# Patient Record
Sex: Female | Born: 1965 | Race: White | Hispanic: No | Marital: Married | Smoking: Never smoker
Health system: Southern US, Community
[De-identification: ages and names within clinical notes are randomized; demographics above are authoritative.]

## PROBLEM LIST (undated history)

## (undated) DIAGNOSIS — T7840XA Allergy, unspecified, initial encounter: Secondary | ICD-10-CM

## (undated) DIAGNOSIS — F329 Major depressive disorder, single episode, unspecified: Secondary | ICD-10-CM

## (undated) DIAGNOSIS — G44219 Episodic tension-type headache, not intractable: Secondary | ICD-10-CM

## (undated) DIAGNOSIS — F32A Depression, unspecified: Secondary | ICD-10-CM

## (undated) DIAGNOSIS — M1990 Unspecified osteoarthritis, unspecified site: Secondary | ICD-10-CM

## (undated) DIAGNOSIS — G5601 Carpal tunnel syndrome, right upper limb: Secondary | ICD-10-CM

## (undated) HISTORY — DX: Depression, unspecified: F32.A

## (undated) HISTORY — DX: Major depressive disorder, single episode, unspecified: F32.9

## (undated) HISTORY — DX: Allergy, unspecified, initial encounter: T78.40XA

## (undated) HISTORY — DX: Episodic tension-type headache, not intractable: G44.219

---
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
## 1998-12-16 ENCOUNTER — Other Ambulatory Visit: Admission: RE | Admit: 1998-12-16 | Discharge: 1998-12-16 | Payer: Self-pay | Admitting: Obstetrics and Gynecology

## 1999-09-01 ENCOUNTER — Encounter (INDEPENDENT_AMBULATORY_CARE_PROVIDER_SITE_OTHER): Payer: Self-pay

## 1999-09-01 ENCOUNTER — Inpatient Hospital Stay (HOSPITAL_COMMUNITY): Admission: AD | Admit: 1999-09-01 | Discharge: 1999-09-03 | Payer: Self-pay | Admitting: Obstetrics and Gynecology

## 1999-09-11 ENCOUNTER — Encounter (HOSPITAL_COMMUNITY): Admission: RE | Admit: 1999-09-11 | Discharge: 1999-12-10 | Payer: Self-pay | Admitting: Obstetrics and Gynecology

## 1999-10-02 ENCOUNTER — Other Ambulatory Visit: Admission: RE | Admit: 1999-10-02 | Discharge: 1999-10-02 | Payer: Self-pay | Admitting: Obstetrics and Gynecology

## 1999-12-12 ENCOUNTER — Encounter: Admission: RE | Admit: 1999-12-12 | Discharge: 2000-02-05 | Payer: Self-pay | Admitting: Obstetrics and Gynecology

## 2001-11-20 ENCOUNTER — Other Ambulatory Visit: Admission: RE | Admit: 2001-11-20 | Discharge: 2001-11-20 | Payer: Self-pay | Admitting: Obstetrics and Gynecology

## 2002-12-04 ENCOUNTER — Other Ambulatory Visit: Admission: RE | Admit: 2002-12-04 | Discharge: 2002-12-04 | Payer: Self-pay | Admitting: Obstetrics and Gynecology

## 2003-11-28 ENCOUNTER — Other Ambulatory Visit: Admission: RE | Admit: 2003-11-28 | Discharge: 2003-11-28 | Payer: Self-pay | Admitting: Obstetrics and Gynecology

## 2004-12-31 ENCOUNTER — Other Ambulatory Visit: Admission: RE | Admit: 2004-12-31 | Discharge: 2004-12-31 | Payer: Self-pay | Admitting: Obstetrics and Gynecology

## 2006-02-07 ENCOUNTER — Other Ambulatory Visit: Admission: RE | Admit: 2006-02-07 | Discharge: 2006-02-07 | Payer: Self-pay | Admitting: Obstetrics and Gynecology

## 2008-04-04 ENCOUNTER — Encounter: Admission: RE | Admit: 2008-04-04 | Discharge: 2008-04-04 | Payer: Self-pay | Admitting: Obstetrics and Gynecology

## 2011-02-05 ENCOUNTER — Other Ambulatory Visit: Payer: Self-pay | Admitting: Family Medicine

## 2011-02-05 DIAGNOSIS — Z1231 Encounter for screening mammogram for malignant neoplasm of breast: Secondary | ICD-10-CM

## 2011-02-12 ENCOUNTER — Ambulatory Visit: Payer: Self-pay

## 2011-03-05 ENCOUNTER — Ambulatory Visit
Admission: RE | Admit: 2011-03-05 | Discharge: 2011-03-05 | Disposition: A | Discharge status: Home / Self Care | Payer: 59 | Source: Ambulatory Visit | Attending: Family Medicine | Admitting: Family Medicine

## 2011-03-05 DIAGNOSIS — Z1231 Encounter for screening mammogram for malignant neoplasm of breast: Secondary | ICD-10-CM

## 2011-03-05 NOTE — Op Note (Signed)
[facility]  Patient:    Jillian Mcknight                         MRN: [id number] Proc. Date: 09/01/99 Adm. Date:  [id number] Attending:  Madelyn Flavors                           Operative Report  PREOPERATIVE DIAGNOSIS:  POSTOPERATIVE DIAGNOSIS:  OPERATION:                    Delivery note.  SURGEON:                      Beather Arbour. Thomasena Edis, M.D.  ASSISTANT:  ANESTHESIA:  ESTIMATED BLOOD LOSS:  DESCRIPTION OF PROCEDURE:     The patient progressed rapidly in labor and pushed effectively.  She delivered via spontaneous vaginal delivery a viable female, Apgars 8 and 9, cord pH 7.28 over a small second degree median episiotomy.  Episiotomy was performed because it was apparent that the patient would sustain a significant tear.  The patient did agree to this procedure.  As the fetus delivered, the hands were up by the shoulders and beyond as if the baby had been lying on its hands.  Great care was taken in this delivery to decrease the risk of fourth degree laceration.  The shoulders were delivered without difficulty.  Fortunately, the  patient did not sustain any laceration.  With delivery of the baby, there was noted to be a very foul odor, although, amniotic fluid which was noted to be nonpurulent. The patient did rupture membranes a couple of hours prior to delivery.  The placenta was delivered spontaneous and intact with a three vessel cord and again was sent to pathology due to the very foul-smelling nature of the placenta. Repair of the second degree median episiotomy was done with 3-0 Dexon in the usual fashion using 1% lidocaine as the anesthesia.  There was an intact rectovaginal septum ith no sponges in the vagina.  Estimated blood loss was 400 cc.  The patient tolerated the procedure well.  The baby was found to be a viable female with Apgars of 8 and 9, cord pH 7.28, and went to the normal newborn nursery.  The decision was  made by the physician to treat the patient with Unasyn IV for 24 hours due to the foul-smelling placenta.  In addition, preeclampsia labs returned because the patient had been  noted to have elevated blood pressures.  DTRs were somewhat brisk at 2+. Because the patient was noted to have LDH of 398, SGOT of 71, and hemoconcentrated at 14.4, the decision was made if continued with elevated blood pressures above the patients baseline, the decision was made to place the patient on magnesium sulfate for 24 hours.  We will recheck preeclampsia labs in the morning.  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  She works at [employer], had checked er own Group B Strep culture and noted that it was negative which she relayed to me at admission.  The husband is also a Research scientist (medical) and works at [employer] and e and the patient sent another Group B Strep culture to [facility] which was also negative.  To corroborate this, I called our head nurse, Julio Sicks at  our office, had her pull up the hard copy from [employer], and found that the Group B  Strep culture was negative.  Thus the patient was not treated with antibiotics because her Group B Strep culture was negative.  Of note, after I had totally and completely finished the delivery and we had this very long discussion about Group B Strep when the patient initially came in in labor and the patient and her husband adamantly told me that this was negative and we had a negative hard copy at our  office from [employer], the patient states that many years ago, she did test positive for Group B Strep.  Thus, it is this physicians desire to enter this in the record to be considered for possible treatment of Group B Strep with the next pregnancy. This has been relayed to the patient.  However, this physician had not been made aware of any  of this information and this is also not in the patients chart here at the hospital.  The patient tolerated the delivery well and was sent to AICU n magnesium sulfate. DD:  09/01/99 TD:  09/02/99 Job: [id number]

## 2012-05-15 ENCOUNTER — Ambulatory Visit (INDEPENDENT_AMBULATORY_CARE_PROVIDER_SITE_OTHER): Payer: 59 | Admitting: Family Medicine

## 2012-05-15 ENCOUNTER — Encounter: Payer: Self-pay | Admitting: Family Medicine

## 2012-05-15 ENCOUNTER — Other Ambulatory Visit (HOSPITAL_COMMUNITY)
Admission: RE | Admit: 2012-05-15 | Discharge: 2012-05-15 | Disposition: A | Discharge status: Home / Self Care | Payer: 59 | Source: Ambulatory Visit | Attending: Family Medicine | Admitting: Family Medicine

## 2012-05-15 VITALS — BP 123/78 | HR 90 | Temp 98.4°F | Ht 66.5 in | Wt 168.2 lb

## 2012-05-15 DIAGNOSIS — Z124 Encounter for screening for malignant neoplasm of cervix: Secondary | ICD-10-CM

## 2012-05-15 DIAGNOSIS — Z01419 Encounter for gynecological examination (general) (routine) without abnormal findings: Secondary | ICD-10-CM | POA: Insufficient documentation

## 2012-05-15 LAB — BASIC METABOLIC PANEL
CO2: 27 mEq/L (ref 19–32)
Calcium: 9.6 mg/dL (ref 8.4–10.5)
Creatinine, Ser: 0.8 mg/dL (ref 0.4–1.2)

## 2012-05-15 LAB — CBC WITH DIFFERENTIAL/PLATELET
Basophils Absolute: 0 10*3/uL (ref 0.0–0.1)
Eosinophils Absolute: 0.2 10*3/uL (ref 0.0–0.7)
HCT: 39.5 % (ref 36.0–46.0)
Hemoglobin: 13.2 g/dL (ref 12.0–15.0)
Lymphs Abs: 1.3 10*3/uL (ref 0.7–4.0)
MCHC: 33.4 g/dL (ref 30.0–36.0)
Monocytes Relative: 9 % (ref 3.0–12.0)
Neutro Abs: 3.9 10*3/uL (ref 1.4–7.7)
RDW: 13.7 % (ref 11.5–14.6)

## 2012-05-15 LAB — LIPID PANEL
Cholesterol: 234 mg/dL — ABNORMAL HIGH (ref 0–200)
Total CHOL/HDL Ratio: 2
Triglycerides: 101 mg/dL (ref 0.0–149.0)

## 2012-05-15 LAB — TSH: TSH: 2.73 u[IU]/mL (ref 0.35–5.50)

## 2012-05-15 LAB — HEPATIC FUNCTION PANEL
Bilirubin, Direct: 0 mg/dL (ref 0.0–0.3)
Total Protein: 7.3 g/dL (ref 6.0–8.3)

## 2012-05-15 LAB — LDL CHOLESTEROL, DIRECT: Direct LDL: 125 mg/dL

## 2012-05-15 MED ORDER — NORGESTIMATE-ETH ESTRADIOL 0.25-35 MG-MCG PO TABS: 1.0000 | ORAL_TABLET | Freq: Every day | ORAL | Status: DC

## 2012-05-15 MED ORDER — FLUTICASONE PROPIONATE 50 MCG/ACT NA SUSP: 2.0000 | Freq: Every day | NASAL | Status: DC

## 2012-05-15 MED ORDER — FLUOXETINE HCL 10 MG PO CAPS: 10.0000 mg | ORAL_CAPSULE | Freq: Every day | ORAL | Status: DC

## 2012-05-15 NOTE — Assessment & Plan Note (Signed)
Pt's PE WNL.  UTD on mammo.  Too young for colonoscopy/DEXA.  Check labs.  EKG done- see document for interpretation.  Anticipatory guidance provided.

## 2012-05-15 NOTE — Progress Notes (Signed)
  Subjective:    Patient ID: Jillian Mcknight, female    DOB: 05-20-1966, 46 y.o.   MRN: [id number]  HPI New to establish.  No previous PCP.  GYN- [place].  Desires CPE today.   Review of Systems Patient reports no vision/ hearing changes, adenopathy,fever, weight change,  persistant/recurrent hoarseness , swallowing issues, chest pain, palpitations, edema, persistant/recurrent cough, hemoptysis, dyspnea (rest/exertional/paroxysmal nocturnal), gastrointestinal bleeding (melena, rectal bleeding), abdominal pain, significant heartburn, bowel changes, GU symptoms (dysuria, hematuria, incontinence), Gyn symptoms (abnormal  bleeding, pain),  syncope, focal weakness, memory loss, numbness & tingling, skin/hair/nail changes, abnormal bruising or bleeding, anxiety, or depression.     Objective:   Physical Exam  General Appearance:    Alert, cooperative, no distress, appears stated age  Head:    Normocephalic, without obvious abnormality, atraumatic  Eyes:    PERRL, conjunctiva/corneas clear, EOM's intact, fundi    benign, both eyes  Ears:    Normal TM's and external ear canals, both ears  Nose:   Nares normal, septum midline, mucosa normal, no drainage    or sinus tenderness  Throat:   Lips, mucosa, and tongue normal; teeth and gums normal  Neck:   Supple, symmetrical, trachea midline, no adenopathy;    Thyroid: no enlargement/tenderness/nodules  Back:     Symmetric, no curvature, ROM normal, no CVA tenderness  Lungs:     Clear to auscultation bilaterally, respirations unlabored  Chest Wall:    No tenderness or deformity   Heart:    Regular rate and rhythm, S1 and S2 normal, no murmur, rub   or gallop  Breast Exam:    No tenderness, masses, or nipple abnormality  Abdomen:     Soft, non-tender, bowel sounds active all four quadrants,    no masses, no organomegaly  Genitalia:    External genitalia normal, cervix normal in appearance, no CMT, uterus in normal size and position, adnexa w/out mass or  tenderness, mucosa pink and moist, no lesions or discharge present  Rectal:    Normal external appearance  Extremities:   Extremities normal, atraumatic, no cyanosis or edema  Pulses:   2+ and symmetric all extremities  Skin:   Skin color, texture, turgor normal, no rashes or lesions  Lymph nodes:   Cervical, supraclavicular, and axillary nodes normal  Neurologic:   CNII-XII intact, normal strength, sensation and reflexes    throughout          Assessment & Plan:

## 2012-05-15 NOTE — Patient Instructions (Addendum)
Follow up in 1 year- sooner if needed We'll notify you of your lab results and make any changes if needed You look great!  Keep up the good work! Schedule exercise the way you would schedule an appt Call with any questions or concerns Welcome!  We're glad to have you!!!

## 2012-05-15 NOTE — Assessment & Plan Note (Signed)
Pap collected. 

## 2012-05-19 ENCOUNTER — Encounter: Payer: Self-pay | Admitting: *Deleted

## 2012-09-19 ENCOUNTER — Other Ambulatory Visit: Payer: Self-pay

## 2012-09-19 MED ORDER — FLUOXETINE HCL 10 MG PO CAPS: 10.0000 mg | ORAL_CAPSULE | Freq: Every day | ORAL | Status: DC

## 2012-09-19 NOTE — Telephone Encounter (Signed)
Rf request [facility]ac [facility]      KP

## 2012-11-24 ENCOUNTER — Telehealth: Payer: Self-pay | Admitting: Family Medicine

## 2012-11-24 MED ORDER — NORGESTIMATE-ETH ESTRADIOL 0.25-35 MG-MCG PO TABS: 1.0000 | ORAL_TABLET | Freq: Every day | ORAL | Status: DC

## 2012-11-24 NOTE — Telephone Encounter (Signed)
Rx sent to the pharmacy by e-script.//AB/CMA 

## 2012-11-24 NOTE — Telephone Encounter (Signed)
Refill: Sprintec 28 day tablet. Take 1 tablet by mouth daily. Qty 84. Last fill 10-28-12

## 2012-11-27 ENCOUNTER — Telehealth: Payer: Self-pay | Admitting: *Deleted

## 2012-11-27 NOTE — Telephone Encounter (Signed)
Verbal order for Sprintec given by phone to Hardie Lora with [facility].

## 2013-01-29 ENCOUNTER — Other Ambulatory Visit: Payer: Self-pay

## 2013-01-29 DIAGNOSIS — Z1231 Encounter for screening mammogram for malignant neoplasm of breast: Secondary | ICD-10-CM

## 2013-03-09 ENCOUNTER — Ambulatory Visit
Admission: RE | Admit: 2013-03-09 | Discharge: 2013-03-09 | Disposition: A | Discharge status: Home / Self Care | Payer: BC Managed Care – PPO | Source: Ambulatory Visit

## 2013-03-09 DIAGNOSIS — Z1231 Encounter for screening mammogram for malignant neoplasm of breast: Secondary | ICD-10-CM

## 2013-04-06 ENCOUNTER — Encounter: Payer: Self-pay | Admitting: Family Medicine

## 2013-04-06 ENCOUNTER — Other Ambulatory Visit (HOSPITAL_COMMUNITY)
Admission: RE | Admit: 2013-04-06 | Discharge: 2013-04-06 | Disposition: A | Discharge status: Home / Self Care | Payer: BC Managed Care – PPO | Source: Ambulatory Visit | Attending: Family Medicine | Admitting: Family Medicine

## 2013-04-06 ENCOUNTER — Ambulatory Visit (INDEPENDENT_AMBULATORY_CARE_PROVIDER_SITE_OTHER): Payer: BC Managed Care – PPO | Admitting: Family Medicine

## 2013-04-06 ENCOUNTER — Telehealth: Payer: Self-pay | Admitting: *Deleted

## 2013-04-06 VITALS — BP 118/82 | HR 87 | Temp 98.5°F | Ht 66.0 in | Wt 169.6 lb

## 2013-04-06 DIAGNOSIS — Z01419 Encounter for gynecological examination (general) (routine) without abnormal findings: Secondary | ICD-10-CM

## 2013-04-06 DIAGNOSIS — M25562 Pain in left knee: Secondary | ICD-10-CM

## 2013-04-06 DIAGNOSIS — K589 Irritable bowel syndrome without diarrhea: Secondary | ICD-10-CM

## 2013-04-06 DIAGNOSIS — M25569 Pain in unspecified knee: Secondary | ICD-10-CM | POA: Insufficient documentation

## 2013-04-06 DIAGNOSIS — R0609 Other forms of dyspnea: Secondary | ICD-10-CM

## 2013-04-06 DIAGNOSIS — Z124 Encounter for screening for malignant neoplasm of cervix: Secondary | ICD-10-CM

## 2013-04-06 DIAGNOSIS — R0683 Snoring: Secondary | ICD-10-CM

## 2013-04-06 LAB — BASIC METABOLIC PANEL
CO2: 27 mEq/L (ref 19–32)
Calcium: 9.8 mg/dL (ref 8.4–10.5)
Chloride: 102 mEq/L (ref 96–112)
Creatinine, Ser: 0.8 mg/dL (ref 0.4–1.2)
Glucose, Bld: 92 mg/dL (ref 70–99)

## 2013-04-06 LAB — HEPATIC FUNCTION PANEL
ALT: 10 U/L (ref 0–35)
Albumin: 4 g/dL (ref 3.5–5.2)
Total Protein: 7.7 g/dL (ref 6.0–8.3)

## 2013-04-06 LAB — LDL CHOLESTEROL, DIRECT: Direct LDL: 124.7 mg/dL

## 2013-04-06 LAB — LIPID PANEL
Cholesterol: 256 mg/dL — ABNORMAL HIGH (ref 0–200)
Triglycerides: 92 mg/dL (ref 0.0–149.0)

## 2013-04-06 MED ORDER — FLUTICASONE PROPIONATE 50 MCG/ACT NA SUSP: 2.0000 | Freq: Every day | NASAL | Status: DC

## 2013-04-06 MED ORDER — FLUOXETINE HCL 10 MG PO CAPS: 10.0000 mg | ORAL_CAPSULE | Freq: Every day | ORAL | Status: DC

## 2013-04-06 MED ORDER — MONTELUKAST SODIUM 10 MG PO TABS: 10.0000 mg | ORAL_TABLET | Freq: Every day | ORAL | Status: DC

## 2013-04-06 MED ORDER — MELOXICAM 15 MG PO TABS: 15.0000 mg | ORAL_TABLET | Freq: Every day | ORAL | Status: DC

## 2013-04-06 NOTE — Telephone Encounter (Signed)
Ok for Singulair- #30, 6 refills

## 2013-04-06 NOTE — Assessment & Plan Note (Signed)
New to provider.  Pt had sleep study done w/ previous MD.  Will attempt to get records and review.  Pt does have deviated septum and this could be responsible for the snoring.  Encouraged use of Flonase regularly and trying Breathe-Right nose strips.  Will follow.

## 2013-04-06 NOTE — Patient Instructions (Addendum)
We'll notify you of your lab results and make any changes if needed Wear the knee brace w/ walking or exercise Strengthen your inner thigh w/ the exercises shown Mobic once daily for pain/inflammation ICE the knee as needed If no improvement- please call We'll review your sleep study Immodium as needed Continue the high fiber, lots of water- add Beano as needed Call with any questions or concerns Hang in there!!

## 2013-04-06 NOTE — Assessment & Plan Note (Signed)
Pt's PE WNL.  UTD on health maintenance.  Check labs.  Anticipatory guidance provided.  

## 2013-04-06 NOTE — Assessment & Plan Note (Signed)
New.  Reviewed lifestyle modifications and that it is ok to use immodium prn.  Reviewed supportive care and red flags that should prompt return.  Pt expressed understanding and is in agreement w/ plan.

## 2013-04-06 NOTE — Progress Notes (Signed)
  Subjective:    Patient ID: Jillian Mcknight, female    DOB: 10-08-1966, 47 y.o.   MRN: [id number]  HPI CPE- pt desires pap even though she had one last year.  UTD on mammo  Knee pain- L knee, started 'a couple of weeks ago'.  'it's like i can't trust it, like it's going to give out'.  Started wearing an old knee brace at [facility] last week w/ some relief.  Taking ibuprofen w/ some relief.  Will have popping laterally.  Sits w/ leg folded up underneath.  Some improvement in pain as activity level returned to normal but still feels weak, stiff, and painful.  Feels better after 'popping'.  ? IBS- 'i was constipated for the 1st 35 yrs of my life'.  Now having intermittent diarrhea, + gas.  Denies abd cramping.  No longer constipated since changing to a better, higher fiber diet.  'i snore like a man'- had sleep study done 2011 at [facility].  Wakes feeling poorly rested.  Not using Flonase.   Review of Systems Patient reports no vision/ hearing changes, adenopathy,fever, weight change,  persistant/recurrent hoarseness , swallowing issues, chest pain, palpitations, edema, persistant/recurrent cough, hemoptysis, dyspnea (rest/exertional/paroxysmal nocturnal), gastrointestinal bleeding (melena, rectal bleeding), abdominal pain, significant heartburn, GU symptoms (dysuria, hematuria, incontinence), Gyn symptoms (abnormal  bleeding, pain),  syncope, focal weakness, memory loss, numbness & tingling, skin/hair/nail changes, abnormal bruising or bleeding, anxiety, or depression.     Objective:   Physical Exam  General Appearance:    Alert, cooperative, no distress, appears stated age  Head:    Normocephalic, without obvious abnormality, atraumatic  Eyes:    PERRL, conjunctiva/corneas clear, EOM's intact, fundi    benign, both eyes  Ears:    Normal TM's and external ear canals, both ears  Nose:   Nares normal, septum midline, mucosa normal, no drainage    or sinus tenderness  Throat:   Lips, mucosa,  and tongue normal; teeth and gums normal  Neck:   Supple, symmetrical, trachea midline, no adenopathy;    Thyroid: no enlargement/tenderness/nodules  Back:     Symmetric, no curvature, ROM normal, no CVA tenderness  Lungs:     Clear to auscultation bilaterally, respirations unlabored  Chest Wall:    No tenderness or deformity   Heart:    Regular rate and rhythm, S1 and S2 normal, no murmur, rub   or gallop  Breast Exam:    No tenderness, masses, or nipple abnormality  Abdomen:     Soft, non-tender, bowel sounds active all four quadrants,    no masses, no organomegaly  Genitalia:    External genitalia normal, cervix normal in appearance, no CMT, uterus in normal size and position, adnexa w/out mass or tenderness, mucosa pink and moist, no lesions or discharge present  Rectal:    Normal external appearance  Extremities:   Extremities normal, atraumatic, no cyanosis or edema.  No TTP along L knee joint lines, no pain w/ full flexion/extension.  Good mobility of knee cap  Pulses:   2+ and symmetric all extremities  Skin:   Skin color, texture, turgor normal, no rashes or lesions  Lymph nodes:   Cervical, supraclavicular, and axillary nodes normal  Neurologic:   CNII-XII intact, normal strength, sensation and reflexes    throughout          Assessment & Plan:

## 2013-04-06 NOTE — Assessment & Plan Note (Signed)
Pap collected. 

## 2013-04-06 NOTE — Telephone Encounter (Signed)
Pt left VM that she would like to get Rx for singulair to help with her allergy. Pt notes that she is currently doing the allergra.[facility] advise [facility] [address]

## 2013-04-06 NOTE — Assessment & Plan Note (Addendum)
New.  L knee. Suspect patellofemoral syndrome but meniscal injury is also possibility.  Encouraged knee brace for support, strengthening inner thigh, icing prn.  NSAIDs prn.  If no improvement, will refer to sports med.  Pt expressed understanding and is in agreement w/ plan.

## 2013-04-06 NOTE — Telephone Encounter (Signed)
Rx sent to the pharmacy by e-script.//AB/CMA 

## 2013-04-07 LAB — CBC WITH DIFFERENTIAL/PLATELET
Basophils Relative: 0.2 % (ref 0.0–3.0)
Eosinophils Relative: 4.2 % (ref 0.0–5.0)
HCT: 41.8 % (ref 36.0–46.0)
Hemoglobin: 14 g/dL (ref 12.0–15.0)
Lymphs Abs: 1.1 10*3/uL (ref 0.7–4.0)
Monocytes Relative: 5.7 % (ref 3.0–12.0)
Neutro Abs: 3.7 10*3/uL (ref 1.4–7.7)
RBC: 4.43 Mil/uL (ref 3.87–5.11)
WBC: 5.4 10*3/uL (ref 4.5–10.5)

## 2013-04-09 ENCOUNTER — Encounter: Payer: Self-pay | Admitting: *Deleted

## 2013-04-11 LAB — VITAMIN D 1,25 DIHYDROXY
Vitamin D 1, 25 (OH)2 Total: 87 pg/mL — ABNORMAL HIGH (ref 18–72)
Vitamin D2 1, 25 (OH)2: <8 pg/mL
Vitamin D3 1, 25 (OH)2: 87 pg/mL

## 2013-04-27 ENCOUNTER — Encounter: Payer: Self-pay | Admitting: *Deleted

## 2013-05-17 ENCOUNTER — Other Ambulatory Visit: Payer: Self-pay | Admitting: Family Medicine

## 2013-06-29 ENCOUNTER — Other Ambulatory Visit: Payer: Self-pay | Admitting: Family Medicine

## 2013-06-29 DIAGNOSIS — M25562 Pain in left knee: Secondary | ICD-10-CM

## 2013-07-04 ENCOUNTER — Ambulatory Visit: Payer: BC Managed Care – PPO | Admitting: Family Medicine

## 2013-07-05 ENCOUNTER — Other Ambulatory Visit: Payer: Self-pay | Admitting: Family Medicine

## 2013-07-06 NOTE — Telephone Encounter (Signed)
[facility] SW, CMA

## 2013-07-10 ENCOUNTER — Telehealth: Payer: Self-pay | Admitting: Family Medicine

## 2013-07-10 NOTE — Telephone Encounter (Signed)
Request sent from [facility]: Fluoxetine tab Montelukast tab Sprintec

## 2013-07-17 NOTE — Telephone Encounter (Signed)
Pt needs to contact pharmacy. Already notified refills should be available.

## 2013-07-20 ENCOUNTER — Ambulatory Visit: Payer: BC Managed Care – PPO | Admitting: Family Medicine

## 2013-07-31 ENCOUNTER — Other Ambulatory Visit: Payer: Self-pay | Admitting: General Practice

## 2013-07-31 MED ORDER — FLUOXETINE HCL 10 MG PO CAPS: 10.0000 mg | ORAL_CAPSULE | Freq: Every day | ORAL | Status: DC

## 2013-07-31 MED ORDER — NORGESTIMATE-ETH ESTRADIOL 0.25-35 MG-MCG PO TABS: 1.0000 | ORAL_TABLET | Freq: Every day | ORAL | Status: DC

## 2013-09-28 ENCOUNTER — Telehealth: Payer: Self-pay | Admitting: *Deleted

## 2013-09-28 NOTE — Telephone Encounter (Signed)
Referral canceled °

## 2013-09-28 NOTE — Telephone Encounter (Signed)
Patient called the office to make up aware that she does not want a referral for her knee pain. Patient has cancelled 3 separate appts with sports med, please cancel orders.

## 2013-10-26 ENCOUNTER — Ambulatory Visit: Payer: BC Managed Care – PPO | Admitting: Family Medicine

## 2013-12-10 ENCOUNTER — Ambulatory Visit: Payer: BC Managed Care – PPO | Admitting: Family Medicine

## 2013-12-19 ENCOUNTER — Encounter: Payer: Self-pay | Admitting: Family Medicine

## 2013-12-19 ENCOUNTER — Other Ambulatory Visit (INDEPENDENT_AMBULATORY_CARE_PROVIDER_SITE_OTHER): Payer: 59

## 2013-12-19 ENCOUNTER — Ambulatory Visit (INDEPENDENT_AMBULATORY_CARE_PROVIDER_SITE_OTHER): Payer: 59 | Admitting: Family Medicine

## 2013-12-19 VITALS — BP 118/66 | HR 74 | Temp 98.9°F | Resp 16 | Wt 170.1 lb

## 2013-12-19 DIAGNOSIS — M357 Hypermobility syndrome: Secondary | ICD-10-CM | POA: Insufficient documentation

## 2013-12-19 DIAGNOSIS — M7710 Lateral epicondylitis, unspecified elbow: Secondary | ICD-10-CM

## 2013-12-19 DIAGNOSIS — M25569 Pain in unspecified knee: Secondary | ICD-10-CM

## 2013-12-19 NOTE — Assessment & Plan Note (Signed)
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 I do not think this is Marfan syndrome. Patient given home exercises and discussed strengthening muscles and avoiding any increasing flexibility. Patient given a knee compression sleeve today and can get other compression sleeves for other joints if we have problems. Patient and will followup again in 3-4 weeks. Do not feel further workup is necessary at this time.

## 2013-12-19 NOTE — Progress Notes (Signed)
[name] [address] [place],  [address] Phone: [phone] Subjective:    I'm seeing this patient by the request  of:  Annye Asa, MD   CC: Left knee discomfort  IFO:YDXAJOINOM Jillian Mcknight is a 48 y.o. female coming in with complaint of left knee discomfort. Patient is a 48 year old female who is never injured this knee but states that she has had increased flexibility of multiple joints all her life. Patient is to she say pop out her knee from time to time as a party trick. Patient states though recently while she was trying to increase the amount walking she was doing she started having some discomfort mostly over the lateral knee pain patient states that he can have some popping with some discomfort. Patient does not remember what type of activity seems to make her worse other than walking. Patient states it seems to come and go and does respond well to anti-inflammatories. Patient states from time to time she feels that there is swelling in the knee as well. Denies giving out on her but states that it feels unstable. Patient rates the discomfort is more of a dull pain that is 3/10 but the instability is what she is concerned about.     Past medical history, social, surgical and family history all reviewed in electronic medical record.   Review of Systems: No headache, visual changes, nausea, vomiting, diarrhea, constipation, dizziness, abdominal pain, skin rash, fevers, chills, night sweats, weight loss, swollen lymph nodes, body aches, joint swelling, muscle aches, chest pain, shortness of breath, mood changes.   Objective Blood pressure 118/66, pulse 74, temperature 98.9 F (37.2 C), temperature source Oral, resp. rate 16, weight 170 lb 1.3 oz (77.148 kg), SpO2 99.00%.  General: No apparent distress alert and oriented x3 mood and affect normal, dressed appropriately.  HEENT: Pupils equal, extraocular movements intact  Respiratory:  Patient's speak in full sentences and does not appear short of breath  Cardiovascular: No lower extremity edema, non tender, no erythema  Skin: Warm dry intact with no signs of infection or rash on extremities or on axial skeleton.  Abdomen: Soft nontender  Neuro: Cranial nerves II through XII are intact, neurovascularly intact in all extremities with 2+ DTRs and 2+ pulses.  Lymph: No lymphadenopathy of posterior or anterior cervical chain or axillae bilaterally.  Gait normal with good balance and coordination.  MSK:  Non tender with full range of motion and good stability and symmetric strength and tone of shoulders, elbows, wrist, hip, and ankles bilaterally. Patient does have significant hypermobility of multiple joints. Beighton score of 8 Knee: Left Normal to inspection with no erythema or effusion or obvious bony abnormalities. Palpation normal with no warmth, joint line tenderness, patellar tenderness, or condyle tenderness. ROM is full with 10 of hyperextension.. Ligaments with solid consistent endpoints including ACL, PCL, LCL, MCL. Negative Mcmurray's, Apley's, and Thessalonian tests. Non painful patellar compression. Patellar glide without crepitus. Patellar and quadriceps tendons unremarkable. Hamstring and quadriceps strength is normal.  Contralateral knee he has no pain is still has the 10 of hyperextension   MSK US performed of: Left knee This study was ordered, performed, and interpreted by Jillian Mcknight D.O.  Knee: All structures visualized. Anteromedial, anterolateral, posteromedial, and posterolateral menisci unremarkable without tearing, fraying, effusion, or displacement. Patient does have some mild swelling around the popliteus muscle. Patellar Tendon unremarkable on long and transverse views without effusion. No abnormality of prepatellar bursa. LCL and MCL unremarkable on  long and transverse views. No abnormality of origin of medial or lateral head of the  gastrocnemius.  IMPRESSION:  Questionable popliteus subluxation   Elbow: Right Unremarkable to inspection. Range of motion full pronation, supination, flexion, extension. Strength is full to all of the above directions Stable to varus, valgus stress. Negative moving valgus stress test. Tender to palpation over the lateral epicondylar region with positive pain at the lateral epicondylar region with resisted extension of the middle finger. Ulnar nerve does not sublux. Negative cubital tunnel Tinel's. Contralateral total unremarkable   Impression and Recommendations:     This case required medical decision making of moderate complexity.

## 2013-12-19 NOTE — Assessment & Plan Note (Signed)
Try conservative therapy at this time. Given home exercises, discussed icing regimen, patient declined a wrist brace at this time but this is well at start of followup is still having pain. Patient may be a candidate for nitroglycerin as well if does not improve. We'll do a week course of anti-inflammatory. We'll see patient back 3 to 4 weeks.

## 2013-12-19 NOTE — Assessment & Plan Note (Signed)
Patient's left lateral knee pain is likely multifactorial in is secondary to her hypermobility syndrome. Patient also had some mild hypoechoic changes around the popliteus muscle which makes me think there could be some subluxation occurring from time to time. Patient does have extreme flexibility and we may want to consider workup for connective tissue disease but she has no other past medical history that is concerning at this time. Discussed home exercise with patient. Handout given Patient was given a knee brace today. This compression sleeve will help decrease the amount of hyperextension. She'll wear this with activity. Discussed icing as well as anti-inflammatories a can be beneficial. The discussed the importance of strengthening and muscle strength. Patient will come back again in 3-4 weeks for further evaluation. If she continues to have trouble we may need to think about any bigger brace, or further imaging.

## 2013-12-19 NOTE — Patient Instructions (Signed)
Very nice to meet you For your knee wear the compression with a lot of activity.  I do think you have a connective tissue disease so will need to focus on muscle strength and not flexibility. Weights at least 2 times a week Biking and wall sits can help the knee.  For your elbow Try exercises 3 times a week Ice 20 minutes 2 times a day Meloxicam daily for 7 days Come back and see me again in 3-4 weeks.

## 2013-12-19 NOTE — Progress Notes (Signed)
Pre visit review using our clinic review tool, if applicable. No additional management support is needed unless otherwise documented below in the visit note. 

## 2013-12-25 ENCOUNTER — Other Ambulatory Visit: Payer: Self-pay | Admitting: Family Medicine

## 2013-12-25 NOTE — Telephone Encounter (Signed)
Med filled.  

## 2013-12-27 ENCOUNTER — Telehealth: Payer: Self-pay | Admitting: *Deleted

## 2013-12-27 ENCOUNTER — Encounter: Payer: Self-pay | Admitting: Internal Medicine

## 2013-12-27 ENCOUNTER — Ambulatory Visit (INDEPENDENT_AMBULATORY_CARE_PROVIDER_SITE_OTHER): Payer: 59 | Admitting: Internal Medicine

## 2013-12-27 VITALS — BP 140/82 | HR 82 | Ht 66.0 in | Wt 172.2 lb

## 2013-12-27 DIAGNOSIS — G4711 Idiopathic hypersomnia with long sleep time: Secondary | ICD-10-CM | POA: Insufficient documentation

## 2013-12-27 DIAGNOSIS — R0989 Other specified symptoms and signs involving the circulatory and respiratory systems: Secondary | ICD-10-CM

## 2013-12-27 DIAGNOSIS — R0683 Snoring: Secondary | ICD-10-CM

## 2013-12-27 DIAGNOSIS — G4733 Obstructive sleep apnea (adult) (pediatric): Secondary | ICD-10-CM

## 2013-12-27 DIAGNOSIS — R0609 Other forms of dyspnea: Secondary | ICD-10-CM

## 2013-12-27 DIAGNOSIS — G4710 Hypersomnia, unspecified: Secondary | ICD-10-CM

## 2013-12-27 NOTE — Progress Notes (Signed)
Subjective:    Patient ID: Jillian Mcknight, female    DOB: [birth year], 48 y.o.   MRN: [id number]  HPI 12/27/13- 18 yoF- Self referral. Pt states she never feels rested. Pts husband states she has periods of apnea during the night. Sleep study done in 2011. EPWORTH= 15 Patient describes nonrestorative sleep. Falls asleep easily at around 10 PM, waking briefly 2 or 3 times before up at 5:15 AM. No nap opportunity but feels that she could fall asleep any time if she were quiet. Rare sleep paralysis and no cataplexy recognized. Her husband tells her she snores loudly. NPSG in 2011 by [facility] showed apnea index less than 1. She does not take sedating medicines. One cup of coffee daily. Rare Benadryl for sleep. No ENT surgery. Normal thyroid function. No head injury. Father is remembered as being sleepy.  Prior to Admission medications   Medication Sig Start Date End Date Taking? Authorizing Provider  cholecalciferol (VITAMIN D) 1000 UNITS tablet Take 1,000 Units by mouth daily.   Yes Historical Provider, MD  FLUoxetine (PROZAC) 10 MG capsule Take 1 capsule (10 mg total) by mouth daily. 07/31/13  Yes Midge Minium, MD  Magnesium 250 MG TABS Take 1 tablet by mouth.   Yes Historical Provider, MD  meloxicam (MOBIC) 15 MG tablet TAKE 1 TABLET (15 MG TOTAL) BY MOUTH DAILY PRN 07/05/13  Yes Midge Minium, MD  montelukast (SINGULAIR) 10 MG tablet Take 1 tablet (10 mg total) by mouth at bedtime. 04/06/13  Yes Midge Minium, MD  Simethicone (GAS-X PO) Take 1 tablet by mouth daily as needed.   Yes Historical Provider, MD  SPRINTEC 28 0.25-35 MG-MCG tablet Take 1 tablet by mouth  daily 12/25/13  Yes Midge Minium, MD  clobetasol ointment (TEMOVATE) 1.91 % Apply 1 application topically 2 (two) times daily.    Historical Provider, MD  fluticasone (FLONASE) 50 MCG/ACT nasal spray Place 2 sprays into the nose daily. 04/06/13   Midge Minium, MD  Omega-3 Krill Oil 500 MG CAPS Take 1  tablet by mouth.    Historical Provider, MD   Past Medical History  Diagnosis Date  . Depression   . Allergy   . Headache, frequent episodic tension-type    Past Surgical History  Procedure Laterality Date  . Wisdom tooth extraction  1982   ' Family History  Problem Relation Age of Onset  . Heart disease Mother   . Stroke Mother   . Lymphoma Mother   . Alzheimer's disease Father   . Diabetes Paternal Grandmother    History   Social History  . Marital Status: Married    Spouse Name: N/A    Number of Children: N/A  . Years of Education: N/A   Occupational History  . Not on file.   Social History Main Topics  . Smoking status: Never Smoker   . Smokeless tobacco: Not on file  . Alcohol Use: Yes     Comment: socially  . Drug Use: No  . Sexual Activity: Not on file   Other Topics Concern  . Not on file   Social History Narrative  . No narrative on file   Review of Systems  Constitutional: Negative for fever and unexpected weight change.  HENT: Negative for congestion, dental problem, ear pain, nosebleeds, postnasal drip, rhinorrhea, sinus pressure, sneezing, sore throat and trouble swallowing.   Eyes: Negative for redness and itching.  Respiratory: Negative for cough, chest tightness, shortness of breath and wheezing.  Cardiovascular: Negative for palpitations and leg swelling.  Gastrointestinal: Negative for nausea and vomiting.  Genitourinary: Negative for dysuria.  Musculoskeletal: Negative for joint swelling.  Skin: Negative for rash.  Neurological: Negative for headaches.  Hematological: Does not bruise/bleed easily.  Psychiatric/Behavioral: Negative for dysphoric mood. The patient is not nervous/anxious.       Objective:   Physical Exam OBJ- Physical Exam General- Alert, Oriented, Affect-appropriate, Distress- none acute, normal weight Skin- rash-none, lesions- none, excoriation- none Lymphadenopathy- none Head- atraumatic            Eyes- Gross  vision intact, PERRLA, conjunctivae and secretions clear            Ears- Hearing, canals-normal            Nose- Clear, no-Septal dev, mucus, polyps, erosion, perforation             Throat- Mallampati III , mucosa clear , drainage- none, tonsils- atrophic Neck- flexible , trachea midline, no stridor , thyroid nl, carotid no bruit Chest - symmetrical excursion , unlabored           Heart/CV- RRR , no murmur , no gallop  , no rub, nl s1 s2                           - JVD- none , edema- none, stasis changes- none, varices- none           Lung- clear to P&A, wheeze- none, cough- none , dullness-none, rub- none           Chest wall-  Abd- tender-no, distended-no, bowel sounds-present, HSM- no Br/ Gen/ Rectal- Not done, not indicated Extrem- cyanosis- none, clubbing, none, atrophy- none, strength- nl Neuro- grossly intact to observation           Assessment & Plan:

## 2013-12-27 NOTE — Patient Instructions (Signed)
Order - [facility] schedule NPSG split protocol, dx OSA             Followed next morning by a MSLT    Dx hypersomnia  Skip Prozac for 2 days before and then including the days of your sleep studies.   Ok to try an otc "boil and bite" type mouth piece from the drug store to see if helps with snoring and sleep quality

## 2013-12-27 NOTE — Telephone Encounter (Signed)
Patient states in her recent visit with Z. Tamala Julian, in discussion regarding tennis elbow, a brace was mentioned.  She is now calling requesting discussed brace.  Please advise.  CB# [phone]

## 2013-12-27 NOTE — Assessment & Plan Note (Signed)
Further documentation needed. Significant cataplexy and or sleep paralysis not seen. We discussed sleep hygiene, adequate sleep, trial of more sleep. Plan-multiple sleep latency test

## 2013-12-27 NOTE — Assessment & Plan Note (Signed)
Scoring with hypersomnia-need to be very sure this is not sleep apnea. Remote study may have been incorrect. She might be a good candidate for an oral appliance as discussed. Her complaint of daytime somnolence is distinct enough to require evaluation for a primary disorder of excessive daytime somnolence such as narcolepsy. Plan-schedule NPSG be followed next morning by a MSLT.  In the meantime she can try a nonprescription oral appliance as discussed

## 2013-12-28 NOTE — Telephone Encounter (Signed)
Please call her and tell her she can come pick it up and she will have to sign a paper.  She will need to wear it night and day for the next 2 weeks until I see her again.  Wrist brace, removable.

## 2013-12-28 NOTE — Telephone Encounter (Signed)
Left message on VM for pt to come pick up wrist brace.

## 2014-01-09 ENCOUNTER — Ambulatory Visit: Payer: 59 | Admitting: Family Medicine

## 2014-02-10 ENCOUNTER — Ambulatory Visit (HOSPITAL_BASED_OUTPATIENT_CLINIC_OR_DEPARTMENT_OTHER): Payer: 59 | Attending: Internal Medicine

## 2014-02-10 VITALS — Ht 66.0 in | Wt 169.0 lb

## 2014-02-10 DIAGNOSIS — G4733 Obstructive sleep apnea (adult) (pediatric): Secondary | ICD-10-CM | POA: Insufficient documentation

## 2014-02-11 ENCOUNTER — Ambulatory Visit (HOSPITAL_BASED_OUTPATIENT_CLINIC_OR_DEPARTMENT_OTHER): Payer: 59 | Attending: Internal Medicine | Admitting: Radiology

## 2014-02-11 DIAGNOSIS — G4710 Hypersomnia, unspecified: Secondary | ICD-10-CM | POA: Insufficient documentation

## 2014-02-11 DIAGNOSIS — G4719 Other hypersomnia: Secondary | ICD-10-CM

## 2014-02-16 DIAGNOSIS — G4710 Hypersomnia, unspecified: Secondary | ICD-10-CM

## 2014-02-16 NOTE — Sleep Study (Signed)
    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  MEDICATIONS: Charted for review. Medications with potential to affect sleep were held for this study and none were taken throughout the day.  NAP 1: 8 AM sleep latency 6.5 minutes, REM latency N/A  NAP 2: 10 AM sleep latency 4 minutes, REM latency N/A  NAP 3: 12 noon sleep latency 10 minutes, REM latency N/A  NAP 4: 2 PM sleep latency 15.5 minutes, REM latency N/A  NAP 5: 4 PM sleep latency 4.5 minutes, REM latency N/A  MEAN SLEEP LATENCY: 8.1 minutes  NUMBER OF REM EPISODES:  0/5 naps  COMMENTS:  Off medications, Nocturnal Polysomnogram done the night before recorded total sleep time 414 minutes with sleep efficiency 85.6%, sleep latency 27.5 minutes, 27.7% REM with loud snoring but AHI only 0.9 and no other sleep disturbance.  IMPRESSION/ RECOMMENDATION:  Abnormal daytime sleepiness, with mean sleep latency less than 10 minutes. 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  Loud snoring was noted on the polysomnogram but not associated with score respiratory sleep disturbance.  Therapy may include recommendation to nap, advice to pay particular attention to alert and responsible driving, and use of stimulants when appropriate.  Signed Baird Lyons M.D. [place], [employer] of Sleep  Medicine  ELECTRONICALLY SIGNED ON:  02/16/2014, 11:56 AM [place] PH: [phone]   FX: [phone] [place]

## 2014-02-16 NOTE — Sleep Study (Signed)
   NAME: Jillian Mcknight DATE OF BIRTH:  02-08-1966 MEDICAL RECORD NUMBER [id number]  LOCATION: [facility]  PHYSICIAN: Jovanni Eckhart D Ocie Stanzione  DATE OF STUDY: 02/10/2014  SLEEP STUDY TYPE: Nocturnal Polysomnogram               REFERRING PHYSICIAN: Baird Lyons D, MD  INDICATION FOR STUDY: Hypersomnia with sleep apnea  EPWORTH SLEEPINESS SCORE:   15/24 HEIGHT: 5\' 6"  (167.6 cm)  WEIGHT: 169 lb (76.658 kg)    Body mass index is 27.29 kg/(m^2).  NECK SIZE: 13 in.  MEDICATIONS: Charted for review  SLEEP ARCHITECTURE: Total sleep time 414 minutes with sleep efficiency 85.6%. Stage I was 2.5%, stage II 69.8%, stage III absent, REM 27.7% of total sleep time. Sleep latency 83.5 minutes, awake after sleep onset 41 minutes, arousal index 1.3, bedtime medication: None  RESPIRATORY DATA: Apnea hypopnea index (AHI) 0.9 per hour. 6 total events scored including 4 obstructive apneas, 2 central apneas with non-positional events. REM AHI 2.1 per hour.  OXYGEN DATA: Extremely loud snoring with oxygen desaturation to a nadir of 91% and mean oxygen saturation through the study of 93.9% on room air  CARDIAC DATA: Sinus rhythm with occasional PVC   MOVEMENT/PARASOMNIA: No significant movement disturbance, bathroom x1  IMPRESSION/ RECOMMENDATION:   1) Normal unmedicated sleep pattern noting that more time was spent in rem than expected, 2) Occasional respiratory events with sleep disturbance, within normal limits. AHI 0.9 per hour ( the normal range for adults is an AHI from 0-5 events per hour). Very loud snoring with oxygen desaturation to a nadir of 91% and mean oxygen saturation through the study of 93.9% on room air.  3) See results of multiple sleep latency test following this study  Linetta Regner D Maleeka Sabatino Diplomate, [place] [facility]  ELECTRONICALLY SIGNED ON:  02/16/2014, 11:50 AM [place] PH: [phone]   FX: [phone] [place]

## 2014-02-17 DIAGNOSIS — G4730 Sleep apnea, unspecified: Secondary | ICD-10-CM

## 2014-02-17 DIAGNOSIS — G4710 Hypersomnia, unspecified: Secondary | ICD-10-CM

## 2014-02-28 ENCOUNTER — Encounter (INDEPENDENT_AMBULATORY_CARE_PROVIDER_SITE_OTHER): Payer: Self-pay

## 2014-02-28 ENCOUNTER — Ambulatory Visit (INDEPENDENT_AMBULATORY_CARE_PROVIDER_SITE_OTHER): Payer: 59 | Admitting: Internal Medicine

## 2014-02-28 ENCOUNTER — Encounter: Payer: Self-pay | Admitting: Internal Medicine

## 2014-02-28 VITALS — BP 122/86 | HR 87 | Ht 66.0 in | Wt 170.0 lb

## 2014-02-28 DIAGNOSIS — G4710 Hypersomnia, unspecified: Secondary | ICD-10-CM

## 2014-02-28 DIAGNOSIS — R0683 Snoring: Secondary | ICD-10-CM

## 2014-02-28 DIAGNOSIS — R0989 Other specified symptoms and signs involving the circulatory and respiratory systems: Secondary | ICD-10-CM

## 2014-02-28 DIAGNOSIS — R0609 Other forms of dyspnea: Secondary | ICD-10-CM

## 2014-02-28 MED ORDER — ARMODAFINIL 150 MG PO TABS: 150.0000 mg | ORAL_TABLET | Freq: Every day | ORAL | Status: DC

## 2014-02-28 NOTE — Patient Instructions (Signed)
Sample card and script for Nuvigil 150      Try 1/2 or 1 each morning as needed for  Idiopathic Hypersomnia

## 2014-02-28 NOTE — Progress Notes (Signed)
   Subjective:    Patient ID: Jillian Mcknight, female    DOB: [birth year], 48 y.o.   MRN: [id number]  HPI 12/27/13- 48 yoF- Self referral. Pt states she never feels rested. Pts husband states she has periods of apnea during the night. Sleep study done in 2011. EPWORTH= 15 Patient describes nonrestorative sleep. Falls asleep easily at around 10 PM, waking briefly 2 or 3 times before up at 5:15 AM. No nap opportunity but feels that she could fall asleep any time if she were quiet. Rare sleep paralysis and no cataplexy recognized. Her husband tells her she snores loudly. NPSG in 2011 by [facility] showed apnea index less than 1. She does not take sedating medicines. One cup of coffee daily. Rare Benadryl for sleep. No ENT surgery. Normal thyroid function. No head injury. Father is remembered as being sleepy.  02/28/14- 48 yoF  [name] followed for idiopathic hypersomnia FOLLOWS FOR:  Discuss sleep study results. Excessive sleepiness since Jillian Mcknight adult. Occasional sleep paralysis, no cataplexy. 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 NPSG 02/10/14- AHI 0.9/ hr, WNL, loud snoring, weight 169 lbs MSLT 02/11/14- Mean latency 8.1 minutes, REM 0/5 naps.  Review of Systems  Constitutional: Negative for fever and unexpected weight change.  HENT: Negative for congestion, dental problem, ear pain, nosebleeds, postnasal drip, rhinorrhea, sinus pressure, sneezing, sore throat and trouble swallowing.   Eyes: Negative for redness and itching.  Respiratory: Negative for cough, chest tightness, shortness of breath and wheezing.   Cardiovascular: Negative for palpitations and leg swelling.  Gastrointestinal: Negative for nausea and vomiting.  Genitourinary: Negative for dysuria.  Musculoskeletal: Negative for joint swelling.  Skin: Negative for rash.  Neurological: Negative for headaches.  Hematological: Does not bruise/bleed easily.  Psychiatric/Behavioral: Negative for dysphoric mood. The patient is not  nervous/anxious.       Objective:   Physical Exam OBJ- Physical Exam General- Alert, Oriented, Affect-appropriate, Distress- none acute, normal weight Skin- rash-none, lesions- none, excoriation- none Lymphadenopathy- none Head- atraumatic            Eyes- Gross vision intact, PERRLA, conjunctivae and secretions clear            Ears- Hearing, canals-normal            Nose- Clear, no-Septal dev, mucus, polyps, erosion, perforation             Throat- Mallampati III , mucosa clear , drainage- none, tonsils- atrophic Neck- flexible , trachea midline, no stridor , thyroid nl, carotid no bruit Chest - symmetrical excursion , unlabored           Heart/CV- RRR , no murmur , no gallop  , no rub, nl s1 s2                           - JVD- none , edema- none, stasis changes- none, varices- none           Lung- clear to P&A, wheeze- none, cough- none , dullness-none, rub- none           Chest wall-  Abd-  Br/ Gen/ Rectal- Not done, not indicated Extrem- cyanosis- none, clubbing, none, atrophy- none, strength- nl Neuro- grossly intact to observation    Assessment & Plan:

## 2014-03-12 ENCOUNTER — Telehealth: Payer: Self-pay | Admitting: Internal Medicine

## 2014-03-12 NOTE — Telephone Encounter (Signed)
LMOM x 1 

## 2014-03-14 NOTE — Telephone Encounter (Signed)
Called and spoke with the pharmacy and they are faxing over the PA form for the pt.   

## 2014-03-14 NOTE — Telephone Encounter (Signed)
Called and initiated the PA for the nuvigil over the phone.  This has gone into clinical review and they will make the decision and contact our office and the pt.  Will forward to Katie to keep a look out for this.

## 2014-03-19 MED ORDER — MODAFINIL 100 MG PO TABS: 100.0000 mg | ORAL_TABLET | Freq: Every day | ORAL | Status: DC | PRN

## 2014-03-19 NOTE — Telephone Encounter (Signed)
Try Ritalin LA 10 mg, # 50, one or two daily as needed

## 2014-03-19 NOTE — Telephone Encounter (Signed)
CY, Since Nuvigil has been denied please advise if you have any other meds for patient to try. Thanks.

## 2014-03-19 NOTE — Telephone Encounter (Signed)
Spoke with the pt and notified of recs per CDY  She verbalized understanding  Rx was sent to pharm  Nothing further needed 

## 2014-03-19 NOTE — Telephone Encounter (Signed)
Ok then instead of Ritalin LA, offer Provigil 100mg , # 30, 1 or 2 in morning, if needed, refill x 5

## 2014-03-19 NOTE — Telephone Encounter (Signed)
Called spoke with pt. She reports she spoke with insurance. If Dr. Annamaria Boots is okay with generic provigil then they will cover this. She wants to know if this is an option. Please advise thanks

## 2014-03-26 ENCOUNTER — Telehealth: Payer: Self-pay | Admitting: Internal Medicine

## 2014-03-26 MED ORDER — MODAFINIL 100 MG PO TABS: 100.0000 mg | ORAL_TABLET | Freq: Every day | ORAL | Status: DC | PRN

## 2014-03-26 NOTE — Telephone Encounter (Signed)
Called spoke w/ pt. She is requesting 90 day supply on provigil [facility].  We gave pt RX for [facility] day supply [facility]. She has not picked this up yet bc [facility] not [facility]. I called [facility] to see if generic provigil needs PA. Spoke with Minette Brine. She reports the provigil does need PA.  We did PA for nuvigil and it was denied. Please advise Dr. Annamaria Boots thanks

## 2014-03-26 NOTE — Telephone Encounter (Signed)
Ok to order provigil 100 mg, # 90, 1 daily as needed for sleepiness sent to [facility]

## 2014-03-26 NOTE — Telephone Encounter (Signed)
Rx signed and faxed  [facility] for the pt to be made aware

## 2014-03-29 ENCOUNTER — Telehealth: Payer: Self-pay | Admitting: Internal Medicine

## 2014-03-29 ENCOUNTER — Encounter: Payer: Self-pay | Admitting: Internal Medicine

## 2014-03-29 MED ORDER — ARMODAFINIL 150 MG PO TABS: 150.0000 mg | ORAL_TABLET | Freq: Every day | ORAL | Status: DC

## 2014-03-29 NOTE — Telephone Encounter (Signed)
Pt wanting to know if she can come by and pick up some sample of med until prior autho is taken care off, pls advise.Jillian Mcknight

## 2014-03-29 NOTE — Assessment & Plan Note (Addendum)
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 There is stress related to her husband's illness and an element of depression can't be excluded. Plan-we discussed this diagnosis and reviewed basic sleep hygiene. Plan-provigil or Nuvigil would be reasonable intervention as discussed. Consider vivactil or other stimulant meds.

## 2014-03-29 NOTE — Telephone Encounter (Signed)
Per Dr. Annamaria Boots okay tog ive samples. I caleld tp and they have eben left for pick up.

## 2014-03-29 NOTE — Telephone Encounter (Signed)
I called [facility] and was advised provigil needs a PA.  She will fax the form to triage fax. Will await fax PA # [phone] ID# [id number] This was approved until 06/27/14 Approval # [id number] This will come from [facility] and be mailed to pt. She is wanting to know if she can have a sample of nuvigil until she get this medication in. She wants to pick up today. Please advise Dr. Annamaria Boots thanks

## 2014-03-29 NOTE — Assessment & Plan Note (Signed)
Obstructive sleep apnea was ruled out by sleep study

## 2014-04-05 ENCOUNTER — Encounter: Payer: Self-pay | Admitting: Internal Medicine

## 2014-04-21 ENCOUNTER — Other Ambulatory Visit: Payer: Self-pay | Admitting: Family Medicine

## 2014-04-22 NOTE — Telephone Encounter (Signed)
Rx sent to the pharmacy by e-script.//AB/CMA 

## 2014-04-28 ENCOUNTER — Other Ambulatory Visit: Payer: Self-pay | Admitting: Family Medicine

## 2014-04-29 NOTE — Telephone Encounter (Signed)
Med filled pt has a cpe end of July.

## 2014-05-01 ENCOUNTER — Other Ambulatory Visit: Payer: Self-pay

## 2014-05-01 DIAGNOSIS — Z1231 Encounter for screening mammogram for malignant neoplasm of breast: Secondary | ICD-10-CM

## 2014-05-03 ENCOUNTER — Ambulatory Visit: Payer: 59 | Admitting: Internal Medicine

## 2014-05-06 ENCOUNTER — Ambulatory Visit
Admission: RE | Admit: 2014-05-06 | Discharge: 2014-05-06 | Disposition: A | Discharge status: Home / Self Care | Payer: 59 | Source: Ambulatory Visit

## 2014-05-06 ENCOUNTER — Ambulatory Visit: Payer: 59

## 2014-05-06 DIAGNOSIS — Z1231 Encounter for screening mammogram for malignant neoplasm of breast: Secondary | ICD-10-CM

## 2014-05-14 ENCOUNTER — Telehealth: Payer: Self-pay

## 2014-05-14 NOTE — Telephone Encounter (Signed)
Left message for call back Non-identifiable   Pap- 04/06/13- negative MMG- 05/06/14- negative Flu- 07/18/13 Td- DUE

## 2014-05-15 ENCOUNTER — Other Ambulatory Visit (HOSPITAL_COMMUNITY)
Admission: RE | Admit: 2014-05-15 | Discharge: 2014-05-15 | Disposition: A | Discharge status: Home / Self Care | Payer: 59 | Source: Ambulatory Visit | Attending: Family Medicine | Admitting: Family Medicine

## 2014-05-15 ENCOUNTER — Telehealth: Payer: Self-pay | Admitting: Internal Medicine

## 2014-05-15 ENCOUNTER — Ambulatory Visit (INDEPENDENT_AMBULATORY_CARE_PROVIDER_SITE_OTHER): Payer: 59 | Admitting: Family Medicine

## 2014-05-15 ENCOUNTER — Other Ambulatory Visit: Payer: Self-pay | Admitting: General Practice

## 2014-05-15 ENCOUNTER — Encounter: Payer: Self-pay | Admitting: Family Medicine

## 2014-05-15 VITALS — BP 120/80 | HR 79 | Temp 98.1°F | Resp 16 | Ht 66.0 in | Wt 169.2 lb

## 2014-05-15 DIAGNOSIS — Z1151 Encounter for screening for human papillomavirus (HPV): Secondary | ICD-10-CM | POA: Diagnosis present

## 2014-05-15 DIAGNOSIS — Z23 Encounter for immunization: Secondary | ICD-10-CM

## 2014-05-15 DIAGNOSIS — Z01419 Encounter for gynecological examination (general) (routine) without abnormal findings: Secondary | ICD-10-CM | POA: Insufficient documentation

## 2014-05-15 DIAGNOSIS — Z124 Encounter for screening for malignant neoplasm of cervix: Secondary | ICD-10-CM

## 2014-05-15 MED ORDER — NORGESTIMATE-ETH ESTRADIOL 0.25-35 MG-MCG PO TABS: ORAL_TABLET | ORAL | Status: DC

## 2014-05-15 NOTE — Assessment & Plan Note (Signed)
Pap collected. 

## 2014-05-15 NOTE — Assessment & Plan Note (Signed)
Pt's PE WNL w/ exception of mildly atypical appearance of cervix (no hx of abnormal paps or procedures).  Check labs.  UTD on mammo.  Anticipatory guidance provided.

## 2014-05-15 NOTE — Patient Instructions (Signed)
Follow up in 1 year or as needed We'll call you with your GYN appt b/c the best birth control method while on Provigil is an IUD We'll notify you of your lab results and make any changes if needed Keep up the good work!  You look great! Call with any questions or concerns Enjoy the rest of your summer!

## 2014-05-15 NOTE — Progress Notes (Signed)
Pre visit review using our clinic review tool, if applicable. No additional management support is needed unless otherwise documented below in the visit note. 

## 2014-05-15 NOTE — Telephone Encounter (Signed)
Medication and allergies:  Reviewed and updated  90 day supply/mail order: [place], [place] - [address] Local pharmacy:   [id number] [place], [place] - [place] [address]    Immunizations due:  Tdap   A/P: No changes to personal, family history or past surgical hx Pap- 04/06/13- negative; would like a PAP every year. Requesting a PAP with this appointment. MMG- 05/06/14- negative  Flu- 07/18/13  Td- DUE   To Discuss with Provider:  Nothing at this time.

## 2014-05-15 NOTE — Progress Notes (Signed)
   Subjective:    Patient ID: Jillian Mcknight, female    DOB: 16-Oct-1966, 48 y.o.   MRN: [id number]  HPI CPE- UTD on mammo, too young for colonoscopy.  Pt desires pap.  + fatigue, family hx of low ferritin.   Review of Systems Patient reports no vision/ hearing changes, adenopathy,fever, weight change,  persistant/recurrent hoarseness , swallowing issues, chest pain, palpitations, edema, persistant/recurrent cough, hemoptysis, dyspnea (rest/exertional/paroxysmal nocturnal), gastrointestinal bleeding (melena, rectal bleeding), abdominal pain, significant heartburn, bowel changes, GU symptoms (dysuria, hematuria, incontinence), Gyn symptoms (abnormal  bleeding, pain),  syncope, focal weakness, memory loss, numbness & tingling, skin/hair/nail changes, abnormal bruising or bleeding, anxiety, or depression.     Objective:   Physical Exam  General Appearance:    Alert, cooperative, no distress, appears stated age  Head:    Normocephalic, without obvious abnormality, atraumatic  Eyes:    PERRL, conjunctiva/corneas clear, EOM's intact, fundi    benign, both eyes  Ears:    Normal TM's and external ear canals, both ears  Nose:   Nares normal, septum midline, mucosa normal, no drainage    or sinus tenderness  Throat:   Lips, mucosa, and tongue normal; teeth and gums normal  Neck:   Supple, symmetrical, trachea midline, no adenopathy;    Thyroid: no enlargement/tenderness/nodules  Back:     Symmetric, no curvature, ROM normal, no CVA tenderness  Lungs:     Clear to auscultation bilaterally, respirations unlabored  Chest Wall:    No tenderness or deformity   Heart:    Regular rate and rhythm, S1 and S2 normal, no murmur, rub   or gallop  Breast Exam:    No tenderness, masses, or nipple abnormality  Abdomen:     Soft, non-tender, bowel sounds active all four quadrants,    no masses, no organomegaly  Genitalia:    External genitalia normal, cervix w/ cobblestoned appearance, no CMT, uterus in normal  size and position, adnexa w/out mass or tenderness, mucosa pink and moist, no lesions or discharge present  Rectal:    Normal external appearance  Extremities:   Extremities normal, atraumatic, no cyanosis or edema  Pulses:   2+ and symmetric all extremities  Skin:   Skin color, texture, turgor normal, no rashes or lesions  Lymph nodes:   Cervical, supraclavicular, and axillary nodes normal  Neurologic:   CNII-XII intact, normal strength, sensation and reflexes    throughout          Assessment & Plan:

## 2014-05-15 NOTE — Telephone Encounter (Signed)
Spoke with patient-she states that she was told the Provigil will interfere with her BC pills and needs to know if there is another med she can take. Pt aware that CY is out of the office this afternoon and will address this in the morning.

## 2014-05-16 ENCOUNTER — Encounter: Payer: Self-pay | Admitting: General Practice

## 2014-05-16 LAB — BASIC METABOLIC PANEL
BUN: 14 mg/dL (ref 6–23)
CALCIUM: 8.9 mg/dL (ref 8.4–10.5)
CO2: 26 mEq/L (ref 19–32)
Chloride: 101 mEq/L (ref 96–112)
Creatinine, Ser: 0.7 mg/dL (ref 0.4–1.2)
GFR: 92.76 mL/min (ref >60.00)
Glucose, Bld: 78 mg/dL (ref 70–99)
Potassium: 4.1 mEq/L (ref 3.5–5.1)
Sodium: 136 mEq/L (ref 135–145)

## 2014-05-16 LAB — CBC WITH DIFFERENTIAL/PLATELET
BASOS ABS: 0.1 10*3/uL (ref 0.0–0.1)
BASOS PCT: 1.5 % (ref 0.0–3.0)
Eosinophils Absolute: 0.1 10*3/uL (ref 0.0–0.7)
Eosinophils Relative: 1.4 % (ref 0.0–5.0)
HEMATOCRIT: 39.7 % (ref 36.0–46.0)
Hemoglobin: 13.2 g/dL (ref 12.0–15.0)
LYMPHS ABS: 1.8 10*3/uL (ref 0.7–4.0)
Lymphocytes Relative: 25.9 % (ref 12.0–46.0)
MCHC: 33.2 g/dL (ref 30.0–36.0)
MCV: 93.2 fl (ref 78.0–100.0)
MONOS PCT: 6.6 % (ref 3.0–12.0)
Monocytes Absolute: 0.5 10*3/uL (ref 0.1–1.0)
NEUTROS ABS: 4.4 10*3/uL (ref 1.4–7.7)
Neutrophils Relative %: 64.6 % (ref 43.0–77.0)
Platelets: 307 10*3/uL (ref 150.0–400.0)
RBC: 4.26 Mil/uL (ref 3.87–5.11)
RDW: 13.7 % (ref 11.5–15.5)
WBC: 6.8 10*3/uL (ref 4.0–10.5)

## 2014-05-16 LAB — LIPID PANEL
Cholesterol: 228 mg/dL — ABNORMAL HIGH (ref 0–200)
HDL: 109.2 mg/dL (ref >39.00)
LDL CALC: 107 mg/dL — AB (ref 0–99)
NonHDL: 118.8
Total CHOL/HDL Ratio: 2
Triglycerides: 58 mg/dL (ref 0.0–149.0)
VLDL: 11.6 mg/dL (ref 0.0–40.0)

## 2014-05-16 LAB — VITAMIN D 25 HYDROXY (VIT D DEFICIENCY, FRACTURES): VITD: 78.23 ng/mL (ref 30.00–100.00)

## 2014-05-16 LAB — HEPATIC FUNCTION PANEL
ALBUMIN: 3.7 g/dL (ref 3.5–5.2)
ALT: 13 U/L (ref 0–35)
AST: 16 U/L (ref 0–37)
Alkaline Phosphatase: 40 U/L (ref 39–117)
BILIRUBIN DIRECT: 0.1 mg/dL (ref 0.0–0.3)
TOTAL PROTEIN: 7.2 g/dL (ref 6.0–8.3)
Total Bilirubin: 0.3 mg/dL (ref 0.2–1.2)

## 2014-05-16 LAB — TSH: TSH: 1.99 u[IU]/mL (ref 0.35–4.50)

## 2014-05-16 LAB — FERRITIN: Ferritin: 15.4 ng/mL (ref 10.0–291.0)

## 2014-05-16 MED ORDER — AMPHETAMINE-DEXTROAMPHET ER 15 MG PO CP24: 15.0000 mg | ORAL_CAPSULE | ORAL | Status: DC

## 2014-05-16 NOTE — Telephone Encounter (Signed)
Instead of provigil suggest trial of Adderall 15 XR     # 15, each morning as needed

## 2014-05-16 NOTE — Telephone Encounter (Signed)
Pt aware of recs. RX left for pick up. Nothing further needed

## 2014-05-17 LAB — CYTOLOGY - PAP

## 2014-05-20 ENCOUNTER — Telehealth: Payer: Self-pay | Admitting: Family Medicine

## 2014-05-20 NOTE — Telephone Encounter (Signed)
Caller name: Magdelene  Call back [phone]   Reason for call:   Pt wants to know ferritin results from labs.  thanks

## 2014-05-21 NOTE — Telephone Encounter (Signed)
Pt states she is returning your call about the below notes.

## 2014-05-21 NOTE — Telephone Encounter (Signed)
Called and l/m on pt voicemail 

## 2014-06-04 ENCOUNTER — Telehealth: Payer: Self-pay

## 2014-06-04 NOTE — Telephone Encounter (Signed)
Caller name:Raizy Relation to pt: Call back [phone] Pharmacy:  Reason for call: Jillian Mcknight called and wanted Dr Birdie Riddle to know that she was having some untreated anxiety and she had increased her  FLUoxetine (PROZAC) 10 MG capsule from 10mg  to 20mg  and wanted to know if there was anything else she needed to do.

## 2014-06-04 NOTE — Telephone Encounter (Signed)
I agree w/ increasing the Prozac to 20mg - this can take 2-3 weeks to show improvement.  Also, pt is on both Provigil and Adderall- both are stimulants and can worsen anxiety.  May be worthwhile to talk to Dr Gwenette Greet about adjusting the dose.

## 2014-06-05 MED ORDER — FLUOXETINE HCL 10 MG PO CAPS: ORAL_CAPSULE | ORAL | Status: DC

## 2014-06-05 NOTE — Telephone Encounter (Signed)
Pt ok has an appt with Dr. Annamaria Boots next week. New rx for 10mg  BID sent to [facility] as requested.

## 2014-06-11 ENCOUNTER — Encounter: Payer: Self-pay | Admitting: Internal Medicine

## 2014-06-11 ENCOUNTER — Ambulatory Visit: Payer: 59 | Admitting: Internal Medicine

## 2014-06-11 VITALS — BP 122/74 | HR 95 | Ht 66.0 in | Wt 168.2 lb

## 2014-06-11 DIAGNOSIS — G4710 Hypersomnia, unspecified: Secondary | ICD-10-CM

## 2014-06-11 MED ORDER — AMPHETAMINE-DEXTROAMPHET ER 15 MG PO CP24: 15.0000 mg | ORAL_CAPSULE | ORAL | Status: DC

## 2014-06-11 NOTE — Patient Instructions (Signed)
Ok to continue Adderall     Script refilled  Please call as needed

## 2014-06-11 NOTE — Progress Notes (Signed)
   Subjective:    Patient ID: Jillian Mcknight, female    DOB: 04-27-1966, 48 y.o.   MRN: [id number]  HPI 12/27/13- 66 yoF- Self referral. Pt states she never feels rested. Pts husband states she has periods of apnea during the night. Sleep study done in 2011. EPWORTH= 15 Patient describes nonrestorative sleep. Falls asleep easily at around 10 PM, waking briefly 2 or 3 times before up at 5:15 AM. No nap opportunity but feels that she could fall asleep any time if she were quiet. Rare sleep paralysis and no cataplexy recognized. Her husband tells her she snores loudly. NPSG in 2011 by [facility] showed apnea index less than 1. She does not take sedating medicines. One cup of coffee daily. Rare Benadryl for sleep. No ENT surgery. Normal thyroid function. No head injury. Father is remembered as being sleepy.  02/28/14- 31 yoF  (Jillian Mcknight) followed for idiopathic hypersomnia FOLLOWS FOR:  Discuss sleep study results. Excessive sleepiness since  adult. Occasional sleep paralysis, no cataplexy. 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 NPSG 02/10/14- AHI 0.9/ hr, WNL, loud snoring, weight 169 lbs MSLT 02/11/14- Mean latency 8.1 minutes, REM 0/5 naps.  06/11/14- 82 yoF  (Jillian Mcknight) followed for idiopathic hypersomnia FOLLOWS FOR: Adderall is helping with her staying awake.   Review of Systems  Constitutional: Negative for fever and unexpected weight change.  HENT: Negative for congestion, dental problem, ear pain, nosebleeds, postnasal drip, rhinorrhea, sinus pressure, sneezing, sore throat and trouble swallowing.   Eyes: Negative for redness and itching.  Respiratory: Negative for cough, chest tightness, shortness of breath and wheezing.   Cardiovascular: Negative for palpitations and leg swelling.  Gastrointestinal: Negative for nausea and vomiting.  Genitourinary: Negative for dysuria.  Musculoskeletal: Negative for joint swelling.  Skin: Negative for rash.  Neurological: Negative for  headaches.  Hematological: Does not bruise/bleed easily.  Psychiatric/Behavioral: Negative for dysphoric mood. The patient is not nervous/anxious.       Objective:   Physical Exam OBJ- Physical Exam General- Alert, Oriented, Affect-appropriate, Distress- none acute, normal weight Skin- rash-none, lesions- none, excoriation- none Lymphadenopathy- none Head- atraumatic            Eyes- Gross vision intact, PERRLA, conjunctivae and secretions clear            Ears- Hearing, canals-normal            Nose- Clear, no-Septal dev, mucus, polyps, erosion, perforation             Throat- Mallampati III , mucosa clear , drainage- none, tonsils- atrophic Neck- flexible , trachea midline, no stridor , thyroid nl, carotid no bruit Chest - symmetrical excursion , unlabored           Heart/CV- RRR , no murmur , no gallop  , no rub, nl s1 s2                           - JVD- none , edema- none, stasis changes- none, varices- none           Lung- clear to P&A, wheeze- none, cough- none , dullness-none, rub- none           Chest wall-  Abd-  Br/ Gen/ Rectal- Not done, not indicated Extrem- cyanosis- none, clubbing, none, atrophy- none, strength- nl Neuro- grossly intact to observation    Assessment & Plan:

## 2014-07-10 ENCOUNTER — Telehealth: Payer: Self-pay | Admitting: Internal Medicine

## 2014-07-10 MED ORDER — AMPHETAMINE-DEXTROAMPHET ER 15 MG PO CP24: 15.0000 mg | ORAL_CAPSULE | ORAL | Status: DC

## 2014-07-10 NOTE — Telephone Encounter (Signed)
RX printed and placed on CD[facility] cart. Please advise once mailed so we can call pt. thanks

## 2014-07-10 NOTE — Telephone Encounter (Signed)
Mailed to patient and patient is aware.

## 2014-07-10 NOTE — Telephone Encounter (Signed)
Ok to refill 

## 2014-07-10 NOTE — Telephone Encounter (Signed)
Pt last had adderall refilled 06/11/14 #30 Last OV 06/11/14 Pending 12/13/13  LMTCB x1 for pt to advised [facility] not in this afternoon

## 2014-07-12 ENCOUNTER — Telehealth: Payer: Self-pay | Admitting: Family Medicine

## 2014-07-12 MED ORDER — NORGESTIMATE-ETH ESTRADIOL 0.25-35 MG-MCG PO TABS: ORAL_TABLET | ORAL | Status: DC

## 2014-07-12 NOTE — Telephone Encounter (Signed)
Pt is needing new rx norgestimate-ethinyl estradiol ([place] 28) 0.25-35 MG-MCG tablet Send to [facility] mail delivery.

## 2014-07-12 NOTE — Telephone Encounter (Signed)
Med filled.  

## 2014-07-15 NOTE — Telephone Encounter (Signed)
Error/gd °

## 2014-08-23 ENCOUNTER — Telehealth: Payer: Self-pay | Admitting: Internal Medicine

## 2014-08-23 MED ORDER — AMPHETAMINE-DEXTROAMPHET ER 15 MG PO CP24: 15.0000 mg | ORAL_CAPSULE | ORAL | Status: DC

## 2014-08-23 NOTE — Telephone Encounter (Signed)
rx has been printed out and placed on CY cart to be signed and will mail to the pt.  Verified the address in the chart is correct and the pt is aware that this will be mailed to her once signed.  Nothing further is needed.

## 2014-08-26 ENCOUNTER — Telehealth: Payer: Self-pay | Admitting: Family Medicine

## 2014-08-26 NOTE — Telephone Encounter (Signed)
Jillian Mcknight called back and wanted advise, Jillian Mcknight is in constant knee pain, does Dr Tamala Julian want Jillian Mcknight to move up her 11/20 appt with him? Or go ahead and set up MRI?

## 2014-08-26 NOTE — Telephone Encounter (Signed)
Can we see her earlier.

## 2014-08-27 ENCOUNTER — Encounter: Payer: Self-pay | Admitting: Family Medicine

## 2014-08-27 ENCOUNTER — Ambulatory Visit (INDEPENDENT_AMBULATORY_CARE_PROVIDER_SITE_OTHER): Payer: 59 | Admitting: Family Medicine

## 2014-08-27 ENCOUNTER — Ambulatory Visit (INDEPENDENT_AMBULATORY_CARE_PROVIDER_SITE_OTHER)
Admission: RE | Admit: 2014-08-27 | Discharge: 2014-08-27 | Disposition: A | Discharge status: Home / Self Care | Payer: 59 | Source: Ambulatory Visit | Attending: Family Medicine | Admitting: Family Medicine

## 2014-08-27 VITALS — BP 138/86 | HR 87 | Wt 168.0 lb

## 2014-08-27 DIAGNOSIS — M25562 Pain in left knee: Secondary | ICD-10-CM

## 2014-08-27 NOTE — Progress Notes (Signed)
  [name] [address], [place] [address] Phone: [phone] Subjective:    CC: Left knee discomfort  EUM:[id number] Jillian Mcknight is a 48 y.o. female coming in with complaint of left knee discomfort. Patient was seen 8 months ago and was actually diagnosed with Norma popliteus subluxation. Patient was compression sleeve. Patient states that she was doing relatively well but overall, patient states that she is having increasing instability of the knee. Patient states that she has been trying some the exercises without any significant improvement and continues to need the meloxicam on a regular basis. Patient states that she can do activities of daily living but she does a lot of squatting or twisting motions she does have some discomfort. Patient denies any swelling but states that she is concerned that she is not improving. Patient was seen previously at another facility she states she did physical therapy. We do not have this information.     Past medical history, social, surgical and family history all reviewed in electronic medical record.   Review of Systems: No headache, visual changes, nausea, vomiting, diarrhea, constipation, dizziness, abdominal pain, skin rash, fevers, chills, night sweats, weight loss, swollen lymph nodes, body aches, joint swelling, muscle aches, chest pain, shortness of breath, mood changes.   Objective Blood pressure 138/86, pulse 87, weight 168 lb (76.204 kg), SpO2 97 %.  General: No apparent distress alert and oriented x3 mood and affect normal, dressed appropriately.  HEENT: Pupils equal, extraocular movements intact  Respiratory: Patient's speak in full sentences and does not appear short of breath  Cardiovascular: No lower extremity edema, non tender, no erythema  Skin: Warm dry intact with no signs of infection or rash on extremities or on axial skeleton.  Abdomen: Soft nontender  Neuro: Cranial nerves II through  XII are intact, neurovascularly intact in all extremities with 2+ DTRs and 2+ pulses.  Lymph: No lymphadenopathy of posterior or anterior cervical chain or axillae bilaterally.  Gait normal with good balance and coordination.  MSK:  Non tender with full range of motion and good stability and symmetric strength and tone of shoulders, elbows, wrist, hip, and ankles bilaterally. Patient does have significant hypermobility of multiple joints. Beighton score of 8 Knee: Left Normal to inspection with no erythema or effusion or obvious bony abnormalities. Palpation normal with no warmth, mild lateral joint line discomfort ROM is full with 10 of hyperextension.. Ligaments with solid consistent endpoints including ACL, PCL, LCL, MCL. equivocalMcmurray's, Apley's, and Thessalonian tests. painful patellar compression. Patellar glide with mildcrepitus. Patellar and quadriceps tendons unremarkable. Hamstring and quadriceps strength is normal.  Contralateral knee he has no pain is still has the 10 of hyperextension     Impression and Recommendations:     This case required medical decision making of moderate complexity.

## 2014-08-27 NOTE — Patient Instructions (Signed)
Good to see you Due to the chronicity of the problem we will get xrays and MRI You have instability of the knee.  Try the pennsaid twice daily to knee.  Ice 20 minutes at least at night Try the new brace We will get MRI and I would like to see you 1-2 days afterward.

## 2014-08-27 NOTE — Telephone Encounter (Signed)
Spoke to pt, scheduled appt with Dr. Tamala Julian today @ 4pm.

## 2014-08-27 NOTE — Assessment & Plan Note (Signed)
Patient continues to have discomfort as well as some mild instability of the knee she feels like.patient does have a new finding pain when she does rotation on the bent knee with weightbearing. This is potentially concerning for meniscal injury. There is significant amount of anxiety overall that likely is contributing. Patient will get x-rays today and I do feel that likely further imaging is necessary with her feeling unstable. We discussed icing and patient was given a new brace today for comfort. I'm hoping that this will help some of the stability. Patient will avoid any significant cutting motions. Patient will come back after the MRI and we will discuss in further detail. With the MRI we will rule out meniscal injury or any other ligamentous injury that could be contribute.  Spent greater than 25 minutes with patient face-to-face and had greater than 50% of counseling including as described above in assessment and plan.

## 2014-08-28 ENCOUNTER — Telehealth: Payer: Self-pay | Admitting: Internal Medicine

## 2014-08-28 NOTE — Telephone Encounter (Signed)
Caution her that stimulants like adderall and modafanil may be carrying over too long. Sh may need to reduce dose or take earlier. I don't think she has had a prescription sleep med. We can offer trial of ambien 5 mg, 1 for sleep if needed, # 30, NR

## 2014-08-28 NOTE — Telephone Encounter (Signed)
Pt states she having trouble sleeping and would like to know if there are any other alternatives she can take. Pt aware that CY is out of office til tomorrow morning and will address then.

## 2014-08-29 MED ORDER — AMPHETAMINE-DEXTROAMPHET ER 10 MG PO CP24
10.0000 mg | ORAL_CAPSULE | Freq: Every day | ORAL | Status: DC
Start: 2014-08-29 — End: 2014-09-30

## 2014-08-29 NOTE — Telephone Encounter (Signed)
Offer change to Adderall 10 mg XR, # 30, 1 daily as needed

## 2014-08-29 NOTE — Telephone Encounter (Signed)
Called and spoke with pt and she is aware of CY recs.  She did not want to try the [place] since she did not want to get stuck taking a medication like this.  She wanted to know if she could possibly take a lower dose of the adderall?  She stated that she is taking this medication at 5:30 every morning and does not think that she can take it any earlier.  She does take benadryl at night when needed.  CY please advise. Thanks  No Known Allergies  Current Outpatient Prescriptions on File Prior to Visit  Medication Sig Dispense Refill  . amphetamine-dextroamphetamine (ADDERALL XR) 15 MG 24 hr capsule Take 1 capsule by mouth every morning. 30 capsule 0  . Biotin 5000 MCG TABS Take 2 tablets by mouth daily.    . cholecalciferol (VITAMIN D) 1000 UNITS tablet Take 1,000 Units by mouth daily.    Marland Kitchen FLUoxetine (PROZAC) 10 MG capsule Take 2 capsule by mouth  daily 180 capsule 1  . fluticasone (FLONASE) 50 MCG/ACT nasal spray Place 2 sprays into the nose daily. 48 g 3  . Magnesium 250 MG TABS Take 1 tablet by mouth.    . meloxicam (MOBIC) 15 MG tablet TAKE 1 TABLET (15 MG TOTAL) BY MOUTH DAILY PRN    . montelukast (SINGULAIR) 10 MG tablet Take 1 tablet by mouth  daily 90 tablet 1  . norgestimate-ethinyl estradiol (SPRINTEC 28) 0.25-35 MG-MCG tablet Take 1 tablet by mouth  daily 28 tablet 11  . Simethicone (GAS-X PO) Take 1 tablet by mouth daily as needed.     No current facility-administered medications on file prior to visit.

## 2014-08-29 NOTE — Telephone Encounter (Signed)
Spoke with pt and advised of Dr Janee Morn recommendations.  Rx printed and left for CY to sign and will be mailed to pt (address verified).

## 2014-09-02 ENCOUNTER — Ambulatory Visit
Admission: RE | Admit: 2014-09-02 | Discharge: 2014-09-02 | Disposition: A | Discharge status: Home / Self Care | Payer: 59 | Source: Ambulatory Visit | Attending: Family Medicine | Admitting: Family Medicine

## 2014-09-02 DIAGNOSIS — M25562 Pain in left knee: Secondary | ICD-10-CM

## 2014-09-06 ENCOUNTER — Encounter: Payer: Self-pay | Admitting: Family Medicine

## 2014-09-06 ENCOUNTER — Ambulatory Visit: Payer: 59 | Admitting: Family Medicine

## 2014-09-06 ENCOUNTER — Ambulatory Visit (INDEPENDENT_AMBULATORY_CARE_PROVIDER_SITE_OTHER): Payer: 59 | Admitting: Family Medicine

## 2014-09-06 VITALS — BP 138/84 | HR 93 | Ht 66.0 in | Wt 167.0 lb

## 2014-09-06 DIAGNOSIS — S8982XD Other specified injuries of left lower leg, subsequent encounter: Secondary | ICD-10-CM

## 2014-09-06 DIAGNOSIS — S838X2D Sprain of other specified parts of left knee, subsequent encounter: Secondary | ICD-10-CM

## 2014-09-06 DIAGNOSIS — S838X2A Sprain of other specified parts of left knee, initial encounter: Secondary | ICD-10-CM | POA: Insufficient documentation

## 2014-09-06 DIAGNOSIS — M25562 Pain in left knee: Secondary | ICD-10-CM

## 2014-09-06 MED ORDER — DICLOFENAC SODIUM 2 % TD SOLN: TRANSDERMAL | Status: DC

## 2014-09-06 MED ORDER — DICLOFENAC SODIUM 2 % TD SOLN
TRANSDERMAL | Status: DC
Start: 2014-09-06 — End: 2014-09-06

## 2014-09-06 NOTE — Progress Notes (Signed)
  [name] [address], [place] [address] Phone: [phone] Subjective:    CC: Left knee discomfort  TGG:YIRSWNIOEV Jillian Mcknight is a 48 y.o. female coming in with complaint of left knee discomfort. Patient was having signs of internal derangement as well as some mild effusion. There is concern for meniscal injury. Patient's ultrasound did show the patient had more or less popliteus subluxation with potential for is very small meniscal injury. Patient was referred for an MRI and is here to discuss  results. MRI was reviewed by me. MRI shows patient does have a small meniscal tear posterior horn of the medial meniscus without a discrete area of extrusion. Patient also has partial thickness cartilage loss of the medial femoral condyle and medial tibial plateau. Otherwise fairly unremarkable except for an anterior cruciate ligament cyst.  Patient still complains of the same symptoms. But not giving out on her at this time it has not notice any more swelling.     Past medical history, social, surgical and family history all reviewed in electronic medical record.   Review of Systems: No headache, visual changes, nausea, vomiting, diarrhea, constipation, dizziness, abdominal pain, skin rash, fevers, chills, night sweats, weight loss, swollen lymph nodes, body aches, joint swelling, muscle aches, chest pain, shortness of breath, mood changes.   Objective Blood pressure 138/84, pulse 93, height 5\' 6"  (1.676 m), weight 167 lb (75.751 kg), SpO2 96 %.  General: No apparent distress alert and oriented x3 mood and affect normal, dressed appropriately.  HEENT: Pupils equal, extraocular movements intact  Respiratory: Patient's speak in full sentences and does not appear short of breath  Cardiovascular: No lower extremity edema, non tender, no erythema  Skin: Warm dry intact with no signs of infection or rash on extremities or on axial skeleton.  Abdomen: Soft  nontender  Neuro: Cranial nerves II through XII are intact, neurovascularly intact in all extremities with 2+ DTRs and 2+ pulses.  Lymph: No lymphadenopathy of posterior or anterior cervical chain or axillae bilaterally.  Gait normal with good balance and coordination.  MSK:  Non tender with full range of motion and good stability and symmetric strength and tone of shoulders, elbows, wrist, hip, and ankles bilaterally. Patient does have significant hypermobility of multiple joints. Beighton score of 8 Knee: Left Normal to inspection with no erythema or effusion or obvious bony abnormalities. Palpation normal with no warmth, mild lateral joint line discomfort ROM is full with 10 of hyperextension.. Ligaments with solid consistent endpoints including ACL, PCL, LCL, MCL. equivocalMcmurray's, Apley's, and Thessalonian tests. painful patellar compression. Patellar glide with mildcrepitus. Patellar and quadriceps tendons unremarkable. Hamstring and quadriceps strength is normal.  Contralateral knee he has no pain is still has the 10 of hyperextension  No significant change from previous exam  After informed written and verbal consent, patient was seated on exam table. Left knee was prepped with alcohol swab and utilizing anterolateral approach, patient's left knee space was injected with 4:1  marcaine 0.5%: Kenalog 40mg /dL. Patient tolerated the procedure well without immediate complications.  Impression and Recommendations:     This case required medical decision making of moderate complexity.

## 2014-09-06 NOTE — Patient Instructions (Signed)
Good to see you You have a small meniscal tear that will heal Tried injection today Physical therapy will be calling you.  Ice is still your friend See me again in 4 weeks.

## 2014-09-27 ENCOUNTER — Telehealth: Payer: Self-pay | Admitting: Internal Medicine

## 2014-09-27 NOTE — Telephone Encounter (Signed)
Called and spoke to pt. Pt requesting refill of Adderall 10mg   Last refill on 08/29/14 of #30 with 0 refills.  Pt last seen on 06/11/14 by CY. ---Pt does not want a call back. Will only need to place rx in mail.   Dr. Annamaria Boots please advise.  No Known Allergies   Current Outpatient Prescriptions on File Prior to Visit  Medication Sig Dispense Refill  . amphetamine-dextroamphetamine (ADDERALL XR) 10 MG 24 hr capsule Take 1 capsule (10 mg total) by mouth daily. 30 capsule 0  . Biotin 5000 MCG TABS Take 2 tablets by mouth daily.    . cholecalciferol (VITAMIN D) 1000 UNITS tablet Take 1,000 Units by mouth daily.    . Diclofenac Sodium 2 % SOLN Apply 1 pump twice daily. 112 g 3  . FLUoxetine (PROZAC) 10 MG capsule Take 2 capsule by mouth  daily 180 capsule 1  . fluticasone (FLONASE) 50 MCG/ACT nasal spray Place 2 sprays into the nose daily. 48 g 3  . Magnesium 250 MG TABS Take 1 tablet by mouth.    . meloxicam (MOBIC) 15 MG tablet TAKE 1 TABLET (15 MG TOTAL) BY MOUTH DAILY PRN    . montelukast (SINGULAIR) 10 MG tablet Take 1 tablet by mouth  daily 90 tablet 1  . norgestimate-ethinyl estradiol (SPRINTEC 28) 0.25-35 MG-MCG tablet Take 1 tablet by mouth  daily 28 tablet 11  . Simethicone (GAS-X PO) Take 1 tablet by mouth daily as needed.     No current facility-administered medications on file prior to visit.

## 2014-09-28 NOTE — Telephone Encounter (Signed)
Ok to refill as requested 

## 2014-09-30 ENCOUNTER — Ambulatory Visit (INDEPENDENT_AMBULATORY_CARE_PROVIDER_SITE_OTHER): Payer: 59 | Admitting: Licensed Clinical Social Worker

## 2014-09-30 DIAGNOSIS — F419 Anxiety disorder, unspecified: Secondary | ICD-10-CM

## 2014-09-30 MED ORDER — AMPHETAMINE-DEXTROAMPHET ER 10 MG PO CP24: 10.0000 mg | ORAL_CAPSULE | Freq: Every day | ORAL | Status: DC

## 2014-09-30 NOTE — Telephone Encounter (Signed)
Rx placed in mail as requested.

## 2014-09-30 NOTE — Telephone Encounter (Signed)
Rx printed and placed on CY's cart. Will wait for signed rx.

## 2014-10-07 ENCOUNTER — Encounter: Payer: Self-pay | Admitting: Family Medicine

## 2014-10-07 ENCOUNTER — Ambulatory Visit (INDEPENDENT_AMBULATORY_CARE_PROVIDER_SITE_OTHER): Payer: 59 | Admitting: Family Medicine

## 2014-10-07 VITALS — BP 134/84 | HR 85 | Ht 66.0 in | Wt 169.0 lb

## 2014-10-07 DIAGNOSIS — M25562 Pain in left knee: Secondary | ICD-10-CM

## 2014-10-07 DIAGNOSIS — S8982XD Other specified injuries of left lower leg, subsequent encounter: Secondary | ICD-10-CM

## 2014-10-07 DIAGNOSIS — S838X2D Sprain of other specified parts of left knee, subsequent encounter: Secondary | ICD-10-CM

## 2014-10-07 NOTE — Patient Instructions (Signed)
Good to see you You are doing great! Try the compression sleeve daily and then with activity only after a week.  Ice after activity New exercises for the hamstring Come back again in 3-4 weeks to make sure you are improving.  Happy holidays!

## 2014-10-07 NOTE — Assessment & Plan Note (Signed)
With patient having more pain on the lateral aspect and patient not having an MRI consistent with this type of pain I do believe that this is more of a hamstring injury. Discuss compression and patient was given a sleeve today. Patient will use of meloxicam up to 3 days in a row for any flares. We discussed icing regimen. We discussed which activities would be beneficial in the home exercise handout was given. Patient and will come back in 2-3 weeks. Patient does not make any significant improvement then we'll consider viscous supplementation for the knee itself for the continued meniscal injury.

## 2014-10-07 NOTE — Progress Notes (Signed)
  [name] [address], [place] [address] Phone: [phone] Subjective:    CC: Left knee discomfort follow up  VVO:[id number] Jillian Mcknight is a 48 y.o. female coming in with complaint of left knee discomfort. Patient was having signs of internal derangement as well as some mild effusion. There is concern for meniscal injury. Patient did have this found on MRI. Patient elected to try a corticosteroid injection at last visit. Patient was cut to do home exercises and we discussed formal physical therapy. Patient states she is doing a proximal wing 90% better. Over the course last week some of the pain has started to return. Patient states he smokes over the posterior lateral aspect of the knee. Denies any medial aspect pain. No swelling. Significant better after the injection but now having a little discomfort that is somewhat different than previously. Still going up or downstairs seems to agitate it.   Previous MRI shows patient does have a small meniscal tear posterior horn of the medial meniscus without a discrete area of extrusion. Patient also has partial thickness cartilage loss of the medial femoral condyle and medial tibial plateau. Otherwise fairly unremarkable except for an anterior cruciate ligament cyst.      Past medical history, social, surgical and family history all reviewed in electronic medical record.   Review of Systems: No headache, visual changes, nausea, vomiting, diarrhea, constipation, dizziness, abdominal pain, skin rash, fevers, chills, night sweats, weight loss, swollen lymph nodes, body aches, joint swelling, muscle aches, chest pain, shortness of breath, mood changes.   Objective Blood pressure 134/84, pulse 85, height 5\' 6"  (1.676 m), weight 169 lb (76.658 kg), SpO2 99 %.  General: No apparent distress alert and oriented x3 mood and affect normal, dressed appropriately.  HEENT: Pupils equal, extraocular movements intact    Respiratory: Patient's speak in full sentences and does not appear short of breath  Cardiovascular: No lower extremity edema, non tender, no erythema  Skin: Warm dry intact with no signs of infection or rash on extremities or on axial skeleton.  Abdomen: Soft nontender  Neuro: Cranial nerves II through XII are intact, neurovascularly intact in all extremities with 2+ DTRs and 2+ pulses.  Lymph: No lymphadenopathy of posterior or anterior cervical chain or axillae bilaterally.  Gait normal with good balance and coordination.  MSK:  Non tender with full range of motion and good stability and symmetric strength and tone of shoulders, elbows, wrist, hip, and ankles bilaterally. Patient does have significant hypermobility of multiple joints. Beighton score of 8 Knee: Left Normal to inspection with no erythema or effusion or obvious bony abnormalities. Palpation normal with no warmth, mild lateral joint line discomfort ROM is full with 10 of hyperextension.. Ligaments with solid consistent endpoints including ACL, PCL, LCL, MCL. Negative McMurray's, Apley's, and Thessalonian tests. painful patellar compression. Patellar glide with mild crepitus. Patellar and quadriceps tendons unremarkable. Tenderness at insertion of the lateral hamstring tendon.  Contralateral knee he has no pain is still has the 10 of hyperextension  No significant change from previous exam      Impression and Recommendations:     This case required medical decision making of moderate complexity.

## 2014-10-07 NOTE — Assessment & Plan Note (Signed)
Patient could be having some referred pain. If continuing to have difficulty at follow-up I would consider viscous supplementation.

## 2014-10-30 ENCOUNTER — Ambulatory Visit (INDEPENDENT_AMBULATORY_CARE_PROVIDER_SITE_OTHER): Payer: 59 | Admitting: Physician Assistant

## 2014-10-30 ENCOUNTER — Encounter: Payer: Self-pay | Admitting: Physician Assistant

## 2014-10-30 ENCOUNTER — Ambulatory Visit: Payer: 59 | Admitting: Licensed Clinical Social Worker

## 2014-10-30 VITALS — BP 141/78 | HR 87 | Temp 98.3°F | Resp 16 | Ht 66.0 in | Wt 170.4 lb

## 2014-10-30 DIAGNOSIS — J019 Acute sinusitis, unspecified: Secondary | ICD-10-CM

## 2014-10-30 DIAGNOSIS — B9689 Other specified bacterial agents as the cause of diseases classified elsewhere: Secondary | ICD-10-CM | POA: Insufficient documentation

## 2014-10-30 MED ORDER — AMOXICILLIN-POT CLAVULANATE 875-125 MG PO TABS: 1.0000 | ORAL_TABLET | Freq: Two times a day (BID) | ORAL | Status: DC

## 2014-10-30 NOTE — Progress Notes (Signed)
Pre visit review using our clinic review tool, if applicable. No additional management support is needed unless otherwise documented below in the visit note/SLS  

## 2014-10-30 NOTE — Assessment & Plan Note (Signed)
Rx Augmentin.  Increase fluids.  Rest.  Saline nasal spray.  Probiotic.  Mucinex as directed.  Humidifier in bedroom.  Call or return to clinic if symptoms are not improving.  

## 2014-10-30 NOTE — Patient Instructions (Signed)
Please take antibiotic as directed.  Increase fluid intake.  Use Saline nasal spray.  Take a daily multivitamin. Continue Flonase and Singulair.  Place a humidifier in the bedroom.  Please call or return clinic if symptoms are not improving.  Sinusitis Sinusitis is redness, soreness, and swelling (inflammation) of the paranasal sinuses. Paranasal sinuses are air pockets within the bones of your face (beneath the eyes, the middle of the forehead, or above the eyes). In healthy paranasal sinuses, mucus is able to drain out, and air is able to circulate through them by way of your nose. However, when your paranasal sinuses are inflamed, mucus and air can become trapped. This can allow bacteria and other germs to grow and cause infection. Sinusitis can develop quickly and last only a short time (acute) or continue over a long period (chronic). Sinusitis that lasts for more than 12 weeks is considered chronic.  CAUSES  Causes of sinusitis include:  Allergies.  Structural abnormalities, such as displacement of the cartilage that separates your nostrils (deviated septum), which can decrease the air flow through your nose and sinuses and affect sinus drainage.  Functional abnormalities, such as when the small hairs (cilia) that line your sinuses and help remove mucus do not work properly or are not present. SYMPTOMS  Symptoms of acute and chronic sinusitis are the same. The primary symptoms are pain and pressure around the affected sinuses. Other symptoms include:  Upper toothache.  Earache.  Headache.  Bad breath.  Decreased sense of smell and taste.  A cough, which worsens when you are lying flat.  Fatigue.  Fever.  Thick drainage from your nose, which often is green and may contain pus (purulent).  Swelling and warmth over the affected sinuses. DIAGNOSIS  Your caregiver will perform a physical exam. During the exam, your caregiver may:  Look in your nose for signs of abnormal growths  in your nostrils (nasal polyps).  Tap over the affected sinus to check for signs of infection.  View the inside of your sinuses (endoscopy) with a special imaging device with a light attached (endoscope), which is inserted into your sinuses. If your caregiver suspects that you have chronic sinusitis, one or more of the following tests may be recommended:  Allergy tests.  Nasal culture A sample of mucus is taken from your nose and sent to a lab and screened for bacteria.  Nasal cytology A sample of mucus is taken from your nose and examined by your caregiver to determine if your sinusitis is related to an allergy. TREATMENT  Most cases of acute sinusitis are related to a viral infection and will resolve on their own within 10 days. Sometimes medicines are prescribed to help relieve symptoms (pain medicine, decongestants, nasal steroid sprays, or saline sprays).  However, for sinusitis related to a bacterial infection, your caregiver will prescribe antibiotic medicines. These are medicines that will help kill the bacteria causing the infection.  Rarely, sinusitis is caused by a fungal infection. In theses cases, your caregiver will prescribe antifungal medicine. For some cases of chronic sinusitis, surgery is needed. Generally, these are cases in which sinusitis recurs more than 3 times per year, despite other treatments. HOME CARE INSTRUCTIONS   Drink plenty of water. Water helps thin the mucus so your sinuses can drain more easily.  Use a humidifier.  Inhale steam 3 to 4 times a day (for example, sit in the bathroom with the shower running).  Apply a warm, moist washcloth to your face 3 to  4 times a day, or as directed by your caregiver.  Use saline nasal sprays to help moisten and clean your sinuses.  Take over-the-counter or prescription medicines for pain, discomfort, or fever only as directed by your caregiver. SEEK IMMEDIATE MEDICAL CARE IF:  You have increasing pain or severe  headaches.  You have nausea, vomiting, or drowsiness.  You have swelling around your face.  You have vision problems.  You have a stiff neck.  You have difficulty breathing. MAKE SURE YOU:   Understand these instructions.  Will watch your condition.  Will get help right away if you are not doing well or get worse. Document Released: 10/04/2005 Document Revised: 12/27/2011 Document Reviewed: 10/19/2011 [facility] Patient Information 2014 [place], [place].

## 2014-10-30 NOTE — Progress Notes (Signed)
History of Present Illness: Jillian Mcknight is a 49 y.o. female who present to the clinic today complaining of sinus pressure, sinus pain and nasal congestion with PND and sore throat x 3 weeks. Patient endorses rhinorrhea with dark yellow mucous.  Patient denies SOB or chest pain.   History: Past Medical History  Diagnosis Date  . Depression   . Allergy   . Headache, frequent episodic tension-type     Current outpatient prescriptions:  .  amphetamine-dextroamphetamine (ADDERALL XR) 10 MG 24 hr capsule, Take 1 capsule (10 mg total) by mouth daily., Disp: 30 capsule, Rfl: 0 .  Biotin 5000 MCG TABS, Take 2 tablets by mouth daily., Disp: , Rfl:  .  cholecalciferol (VITAMIN D) 1000 UNITS tablet, Take 1,000 Units by mouth daily., Disp: , Rfl:  .  Diclofenac Sodium 2 % SOLN, Apply 1 pump twice daily., Disp: 112 g, Rfl: 3 .  FLUoxetine (PROZAC) 10 MG capsule, Take 2 capsule by mouth  daily, Disp: 180 capsule, Rfl: 1 .  fluticasone (FLONASE) 50 MCG/ACT nasal spray, Place 2 sprays into the nose daily., Disp: 48 g, Rfl: 3 .  Magnesium 250 MG TABS, Take 1 tablet by mouth., Disp: , Rfl:  .  meloxicam (MOBIC) 15 MG tablet, TAKE 1 TABLET (15 MG TOTAL) BY MOUTH DAILY PRN, Disp: , Rfl:  .  montelukast (SINGULAIR) 10 MG tablet, Take 1 tablet by mouth  daily, Disp: 90 tablet, Rfl: 1 .  norgestimate-ethinyl estradiol (SPRINTEC 28) 0.25-35 MG-MCG tablet, Take 1 tablet by mouth  daily, Disp: 28 tablet, Rfl: 11 .  Simethicone (GAS-X PO), Take 1 tablet by mouth daily as needed., Disp: , Rfl:  .  amoxicillin-clavulanate (AUGMENTIN) 875-125 MG per tablet, Take 1 tablet by mouth 2 (two) times daily., Disp: 20 tablet, Rfl: 0 No Known Allergies Family History  Problem Relation Age of Onset  . Heart disease Mother   . Stroke Mother   . Lymphoma Mother   . Alzheimer's disease Father   . Diabetes Paternal Grandmother    History   Social History  . Marital Status: Married    Spouse Name: N/A    Number of  Children: N/A  . Years of Education: N/A   Social History Main Topics  . Smoking status: Never Smoker   . Smokeless tobacco: None  . Alcohol Use: Yes     Comment: socially  . Drug Use: No  . Sexual Activity: None   Other Topics Concern  . None   Social History Narrative   Review of Systems: See HPI.  All other ROS are negative.  Physical Examination: BP 141/78 mmHg  Pulse 87  Temp(Src) 98.3 F (36.8 C) (Oral)  Resp 16  Ht 5\' 6"  (1.676 m)  Wt 170 lb 6 oz (77.282 kg)  BMI 27.51 kg/m2  SpO2 100%  LMP 09/16/2014  General appearance: alert, cooperative and appears stated age Head: Normocephalic, without obvious abnormality, atraumatic, sinuses tender to percussion Eyes: conjunctivae/corneas clear. PERRL, EOM's intact. Fundi benign. Ears: normal TM's and external ear canals both ears Nose: moderate congestion, turbinates swollen, sinus tenderness bilateral Throat: lips, mucosa, and tongue normal; teeth and gums normal Neck: no adenopathy, no carotid bruit, no JVD, supple, symmetrical, trachea midline and thyroid not enlarged, symmetric, no tenderness/mass/nodules Lungs: clear to auscultation bilaterally Chest wall: no tenderness  Assessment/Plan: Acute bacterial sinusitis Rx Augmentin.  Increase fluids.  Rest.  Saline nasal spray.  Probiotic.  Mucinex as directed.  Humidifier in bedroom.  Call or return  to clinic if symptoms are not improving.

## 2014-10-31 ENCOUNTER — Ambulatory Visit: Payer: Self-pay | Admitting: Medical

## 2014-11-01 ENCOUNTER — Ambulatory Visit: Payer: Self-pay | Admitting: Family Medicine

## 2014-11-13 ENCOUNTER — Ambulatory Visit: Payer: 59 | Admitting: Licensed Clinical Social Worker

## 2014-11-15 ENCOUNTER — Ambulatory Visit: Payer: Self-pay | Admitting: Family Medicine

## 2014-11-20 ENCOUNTER — Telehealth: Payer: Self-pay | Admitting: Internal Medicine

## 2014-11-20 MED ORDER — AMPHETAMINE-DEXTROAMPHET ER 10 MG PO CP24: 10.0000 mg | ORAL_CAPSULE | Freq: Every day | ORAL | Status: DC

## 2014-11-20 NOTE — Telephone Encounter (Signed)
Rx printed, signed by CY, and mailed to patient. I have left a message on patients mobile number stating this. Nothing more needed at this time.

## 2014-11-25 ENCOUNTER — Ambulatory Visit (INDEPENDENT_AMBULATORY_CARE_PROVIDER_SITE_OTHER): Payer: 59 | Admitting: Licensed Clinical Social Worker

## 2014-11-25 DIAGNOSIS — F419 Anxiety disorder, unspecified: Secondary | ICD-10-CM

## 2014-12-13 ENCOUNTER — Ambulatory Visit: Payer: 59 | Admitting: Internal Medicine

## 2014-12-16 ENCOUNTER — Ambulatory Visit (INDEPENDENT_AMBULATORY_CARE_PROVIDER_SITE_OTHER): Payer: 59 | Admitting: Internal Medicine

## 2014-12-16 ENCOUNTER — Encounter: Payer: Self-pay | Admitting: Internal Medicine

## 2014-12-16 VITALS — BP 120/78 | HR 94 | Ht 66.0 in | Wt 167.2 lb

## 2014-12-16 DIAGNOSIS — G4711 Idiopathic hypersomnia with long sleep time: Secondary | ICD-10-CM

## 2014-12-16 MED ORDER — AMPHETAMINE-DEXTROAMPHETAMINE 5 MG PO TABS: ORAL_TABLET | ORAL | Status: DC

## 2014-12-16 NOTE — Progress Notes (Signed)
Subjective:    Patient ID: Jillian Mcknight, female    DOB: Jan 07, 1966, 49 y.o.   MRN: [id number]  HPI 12/27/13- 39 yoF- Self referral. Pt states she never feels rested. Pts husband states she has periods of apnea during the night. Sleep study done in 2011. EPWORTH= 15 Patient describes nonrestorative sleep. Falls asleep easily at around 10 PM, waking briefly 2 or 3 times before up at 5:15 AM. No nap opportunity but feels that she could fall asleep any time if she were quiet. Rare sleep paralysis and no cataplexy recognized. Her husband tells her she snores loudly. NPSG in 2011 by [facility] showed apnea index less than 1. She does not take sedating medicines. One cup of coffee daily. Rare Benadryl for sleep. No ENT surgery. Normal thyroid function. No head injury. Father is remembered as being sleepy.  02/28/14- 58 yoF  (Jillian Mcknight) followed for idiopathic hypersomnia FOLLOWS FOR:  Discuss sleep study results. Excessive sleepiness since Jillian Mcknight adult. Occasional sleep paralysis, no cataplexy. 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 NPSG 02/10/14- AHI 0.9/ hr, WNL, loud snoring, weight 169 lbs MSLT 02/11/14- Mean latency 8.1 minutes, REM 0/5 naps.  06/11/14- 7 yoF  (Jillian Mcknight) followed for idiopathic hypersomnia FOLLOWS FOR: Adderall is helping with her staying awake.  12/16/14- 89 yoF never smoker (Jillian Mcknight) followed for idiopathic hypersomnia FOLLOWS FOR: Is currently on Adderall 10 XR mg-would like to discuss other options for treatment or lower her dose if possible.  Okay at work. Can sleep if sits quietly. Usually feels quite active. Heart rate stays a little high without palpitation. Tends to wake once or twice during the night and sometimes has trouble getting back to sleep. Thinks this pattern started before her sleep study and was not a result of the Adderall. Provigil worked better but it interfered with her birth control. We discussed a trial of using short-acting Adderall instead of  the extended release.  Review of Systems  Constitutional: Negative for fever and unexpected weight change.  HENT: Negative for congestion, dental problem, ear pain, nosebleeds, postnasal drip, rhinorrhea, sinus pressure, sneezing, sore throat and trouble swallowing.   Eyes: Negative for redness and itching.  Respiratory: Negative for cough, chest tightness, shortness of breath and wheezing.   Cardiovascular: Negative for palpitations and leg swelling.  Gastrointestinal: Negative for nausea and vomiting.  Genitourinary: Negative for dysuria.  Musculoskeletal: Negative for joint swelling.  Skin: Negative for rash.  Neurological: Negative for headaches.  Hematological: Does not bruise/bleed easily.  Psychiatric/Behavioral: Negative for dysphoric mood. The patient is not nervous/anxious.       Objective:   Physical Exam OBJ- Physical Exam General- Alert, Oriented, Affect-appropriate, Distress- none acute, normal weight Skin- rash-none, lesions- none, excoriation- none Lymphadenopathy- none Head- atraumatic            Eyes- Gross vision intact, PERRLA, conjunctivae and secretions clear            Ears- Hearing, canals-normal            Nose- Clear, no-Septal dev, mucus, polyps, erosion, perforation             Throat- Mallampati III , mucosa clear , drainage- none, tonsils- atrophic Neck- flexible , trachea midline, no stridor , thyroid nl, carotid no bruit Chest - symmetrical excursion , unlabored           Heart/CV- RRR , no murmur , no gallop  , no rub, nl s1 s2                           -  JVD- none , edema- none, stasis changes- none, varices- none           Lung- clear to P&A, wheeze- none, cough- none , dullness-none, rub- none           Chest wall-  Abd-  Br/ Gen/ Rectal- Not done, not indicated Extrem- cyanosis- none, clubbing, none, atrophy- none, strength- nl Neuro- grossly intact to observation    Assessment & Plan:

## 2014-12-16 NOTE — Patient Instructions (Signed)
Script to try regular- release Adderall 5 mg tabs. You can interchange with the XR caps and adjust dose and timing to see what works best for you.

## 2014-12-16 NOTE — Assessment & Plan Note (Signed)
She may be getting a little too much stimulation for the extended release Adderall, noting resting heart rate stays a little rapid and she wakes at night. Plan-try Adderall 5 mg regular release as discussed. Discussed good sleep hygiene.

## 2014-12-18 ENCOUNTER — Encounter: Payer: Self-pay | Admitting: Internal Medicine

## 2014-12-20 ENCOUNTER — Telehealth: Payer: Self-pay | Admitting: *Deleted

## 2014-12-20 NOTE — Telephone Encounter (Signed)
Pt called back. Medication she is referring to is Adderall. Her ph # [phone].

## 2014-12-20 NOTE — Telephone Encounter (Signed)
Dr Annamaria Boots will have to decide this.

## 2014-12-20 NOTE — Telephone Encounter (Signed)
Dr. Annamaria Boots,  [facility] will only fill my script for 5mg  2xday (not 3xday).      [facility].         Would either 7.5mg  or 10mg  tablets 2x a day work to cut in half and adjust my dosage? Since these are not extended release, I may need 1 10mg /day or less. I would like to see how low I can get in the am and just take a half in the afternoon if needed.    [facility] [address] [facility] think is best.    Patient would like Rx for Adderall. She says that she will have [facility] because [facility] will only fill her script for 5mg  twice daily instead of three times daily.   CY - Please advise.  Current Outpatient Prescriptions on File Prior to Visit  Medication Sig Dispense Refill  . amphetamine-dextroamphetamine (ADDERALL XR) 10 MG 24 hr capsule Take 1 capsule (10 mg total) by mouth daily. 30 capsule 0  . amphetamine-dextroamphetamine (ADDERALL) 5 MG tablet 1 tab three times daily as needed 50 tablet 0  . Biotin 5000 MCG TABS Take 2 tablets by mouth daily.    . cholecalciferol (VITAMIN D) 1000 UNITS tablet Take 1,000 Units by mouth daily.    . Diclofenac Sodium 2 % SOLN Apply 1 pump twice daily. 112 g 3  . FLUoxetine (PROZAC) 10 MG capsule Take 2 capsule by mouth  daily (Patient taking differently: Take 10 mg by mouth daily. ) 180 capsule 1  . fluticasone (FLONASE) 50 MCG/ACT nasal spray Place 2 sprays into the nose daily. 48 g 3  . Magnesium 250 MG TABS Take 1 tablet by mouth.    . meloxicam (MOBIC) 15 MG tablet TAKE 1 TABLET (15 MG TOTAL) BY MOUTH DAILY PRN    . montelukast (SINGULAIR) 10 MG tablet Take 1 tablet by mouth  daily 90 tablet 1  . norgestimate-ethinyl estradiol (SPRINTEC 28) 0.25-35 MG-MCG tablet Take 1 tablet by mouth  daily 28 tablet 11  . Simethicone (GAS-X PO) Take 1 tablet by mouth daily as needed.     No current facility-administered medications on file prior to visit.    No Known Allergies

## 2014-12-20 NOTE — Telephone Encounter (Signed)
[facility] will only fill my script for 5mg  2xday (not 3xday).      [facility].         Would either 7.5mg  or 10mg  tablets 2x a day work to cut in half and adjust my dosage? Since these are not extended release, I may need 1 10mg /day or less. I would like to see how low I can get in the am and just take a half in the afternoon if needed.    [facility] [address] [facility] what [facility] think is best.    Patient would like Rx for Adderall.  She says that [facility] will [facility] because [facility] will only fill her script for 5mg  twice daily instead of three times daily.    RB- please advise.

## 2014-12-23 NOTE — Telephone Encounter (Signed)
lmtcb X1 for pt  

## 2014-12-23 NOTE — Telephone Encounter (Signed)
Offer Adderall 10 mg tablets, # 60, 1 twice daily as directed

## 2014-12-24 MED ORDER — AMPHETAMINE-DEXTROAMPHETAMINE 10 MG PO TABS: 10.0000 mg | ORAL_TABLET | Freq: Two times a day (BID) | ORAL | Status: DC

## 2014-12-24 NOTE — Telephone Encounter (Signed)
Pt is aware of CY's recommendation. She is okay with this. Rx will be printed, once it's signed it will need to be mailed to the patient.

## 2014-12-24 NOTE — Telephone Encounter (Signed)
Spoke to the pt over the phone about this issue.

## 2014-12-30 ENCOUNTER — Ambulatory Visit (INDEPENDENT_AMBULATORY_CARE_PROVIDER_SITE_OTHER): Payer: 59 | Admitting: Licensed Clinical Social Worker

## 2014-12-30 DIAGNOSIS — F419 Anxiety disorder, unspecified: Secondary | ICD-10-CM

## 2015-01-27 ENCOUNTER — Ambulatory Visit (INDEPENDENT_AMBULATORY_CARE_PROVIDER_SITE_OTHER): Payer: 59 | Admitting: Licensed Clinical Social Worker

## 2015-01-27 DIAGNOSIS — F419 Anxiety disorder, unspecified: Secondary | ICD-10-CM

## 2015-02-18 ENCOUNTER — Other Ambulatory Visit: Payer: Self-pay | Admitting: Family Medicine

## 2015-02-19 NOTE — Telephone Encounter (Signed)
Med filled.  

## 2015-02-24 ENCOUNTER — Ambulatory Visit (INDEPENDENT_AMBULATORY_CARE_PROVIDER_SITE_OTHER): Payer: 59 | Admitting: Licensed Clinical Social Worker

## 2015-02-24 DIAGNOSIS — F419 Anxiety disorder, unspecified: Secondary | ICD-10-CM | POA: Diagnosis not present

## 2015-03-20 ENCOUNTER — Telehealth: Payer: Self-pay | Admitting: Internal Medicine

## 2015-03-20 MED ORDER — AMPHETAMINE-DEXTROAMPHETAMINE 10 MG PO TABS: 10.0000 mg | ORAL_TABLET | Freq: Two times a day (BID) | ORAL | Status: DC

## 2015-03-20 NOTE — Telephone Encounter (Signed)
LMTCB x 1 

## 2015-03-20 NOTE — Telephone Encounter (Signed)
Rx signed by CY and placed in outgoing mail today.

## 2015-03-20 NOTE — Telephone Encounter (Signed)
Pt called back-needs Aderral Rx mailed to confirmed home address. Pt aware that I will mail out today.

## 2015-04-11 ENCOUNTER — Ambulatory Visit (INDEPENDENT_AMBULATORY_CARE_PROVIDER_SITE_OTHER): Payer: 59 | Admitting: Family Medicine

## 2015-04-11 ENCOUNTER — Encounter: Payer: Self-pay | Admitting: Family Medicine

## 2015-04-11 ENCOUNTER — Other Ambulatory Visit (INDEPENDENT_AMBULATORY_CARE_PROVIDER_SITE_OTHER): Payer: 59

## 2015-04-11 VITALS — BP 118/82 | HR 85 | Ht 66.0 in | Wt 169.0 lb

## 2015-04-11 DIAGNOSIS — M25531 Pain in right wrist: Secondary | ICD-10-CM

## 2015-04-11 DIAGNOSIS — G5601 Carpal tunnel syndrome, right upper limb: Secondary | ICD-10-CM | POA: Diagnosis not present

## 2015-04-11 DIAGNOSIS — G5603 Carpal tunnel syndrome, bilateral upper limbs: Secondary | ICD-10-CM | POA: Insufficient documentation

## 2015-04-11 MED ORDER — MELOXICAM 15 MG PO TABS: 15.0000 mg | ORAL_TABLET | Freq: Every day | ORAL | Status: DC

## 2015-04-11 NOTE — Patient Instructions (Signed)
Ice 20 minutes 2 times daily. Usually after activity and before bed. Exercises 3 times a week.  Brace day and night for 1 week then nightly for 2 weeks Meloxicam daily for 10 days then as needed See me again 3 weeks.

## 2015-04-11 NOTE — Progress Notes (Signed)
[name] [place], [place] [address] Phone: [phone] Subjective:   CC: Right wrist pain  TIR:[id number] Jillian Mcknight is a 49 y.o. female coming in with complaint of right wrist pain. Patient states that this is been going on months and seems to be worsening. Patient states that this is associated with numbness of her fingers to her ring finger. This does not affect the pinky finger. Patient states that this seems to be worsening and is having numbness now almost consistently. Patient states that maybe she is having some mild weakness as well. Not stopping her from daily activities . Has been wearing a brace at night.         Past medical history, social, surgical and family history all reviewed in electronic medical record.   Review of Systems: No headache, visual changes, nausea, vomiting, diarrhea, constipation, dizziness, abdominal pain, skin rash, fevers, chills, night sweats, weight loss, swollen lymph nodes, body aches, joint swelling, muscle aches, chest pain, shortness of breath, mood changes.   Objective Blood pressure 118/82, pulse 85, height 5\' 6"  (1.676 m), weight 169 lb (76.658 kg), SpO2 96 %.  General: No apparent distress alert and oriented x3 mood and affect normal, dressed appropriately.  HEENT: Pupils equal, extraocular movements intact  Respiratory: Patient's speak in full sentences and does not appear short of breath  Cardiovascular: No lower extremity edema, non tender, no erythema  Skin: Warm dry intact with no signs of infection or rash on extremities or on axial skeleton.  Abdomen: Soft nontender  Neuro: Cranial nerves II through XII are intact, neurovascularly intact in all extremities with 2+ DTRs and 2+ pulses.  Lymph: No lymphadenopathy of posterior or anterior cervical chain or axillae bilaterally.  Gait normal with good balance and coordination.  MSK:  Non tender with full range of motion and good stability  and symmetric strength and tone of shoulders, elbows, hip, knee and ankles bilaterally.  Wrist: Right Inspection normal with no visible erythema or swelling. ROM smooth and normal with good flexion and extension and ulnar/radial deviation that is symmetrical with opposite wrist. Palpation is normal over metacarpals, navicular, lunate, and TFCC; tendons without tenderness/ swelling No snuffbox tenderness. No tenderness over Canal of Guyon. Mild weakness of pincer compared to the contralateral side Negative Finkelstein, positive tinel's and phalens. Negative Watson's test. Contralateral wrist unremarkable   MSK US performed of: Right This study was ordered, performed, and interpreted by Charlann Boxer D.O.  Wrist: All extensor compartments visualized and tendons all normal in appearance without fraying, tears, or sheath effusions. No effusion seen. TFCC intact. Scapholunate ligament intact. Carpal tunnel visualized and median nerve area increase in size to 1.76 cm compared to 1.1 cm on the contralateral side. Power doppler signal normal.  IMPRESSION:  Carpal tunnel syndrome  Procedure: Real-time Ultrasound Guided Injection of right carpal tunnel Device: GE Logiq E  Ultrasound guided injection is preferred based studies that show increased duration, increased effect, greater accuracy, decreased procedural pain, increased response rate with ultrasound guided versus blind injection.  Verbal informed consent obtained.  Time-out conducted.  Noted no overlying erythema, induration, or other signs of local infection.  Skin prepped in a sterile fashion.  Local anesthesia: Topical Ethyl chloride.  With sterile technique and under real time ultrasound guidance:  median nerve visualized.  23g 5/8 inch needle inserted distal to proximal approach into nerve sheath. Pictures taken nfor needle placement. Patient did have injection of 2 cc of 1% lidocaine,  1 cc of 0.5% Marcaine, and 1 cc of Kenalog 40  mg/dL. Completed without difficulty  Pain immediately resolved suggesting accurate placement of the medication.  Advised to call if fevers/chills, erythema, induration, drainage, or persistent bleeding.  Images permanently stored and available for review in the ultrasound unit.  Impression: Technically successful ultrasound guided injection.     Impression and Recommendations:     This case required medical decision making of moderate complexity.

## 2015-04-11 NOTE — Assessment & Plan Note (Signed)
Patient given topical anti-inflammatory isn't did respond very well to the injection. We discussed icing regimen as well as bracing. Patient given home exercises by athletic trainer today. Patient and will come back in 3 weeks for further evaluation.  Spent  25 minutes with patient face-to-face and had greater than 50% of counseling including as described above in assessment and plan.

## 2015-04-11 NOTE — Progress Notes (Signed)
Pre visit review using our clinic review tool, if applicable. No additional management support is needed unless otherwise documented below in the visit note. 

## 2015-04-14 ENCOUNTER — Other Ambulatory Visit: Payer: Self-pay | Admitting: Family Medicine

## 2015-04-15 NOTE — Telephone Encounter (Signed)
Med filled.  

## 2015-04-18 ENCOUNTER — Other Ambulatory Visit: Payer: Self-pay | Admitting: Family Medicine

## 2015-04-18 NOTE — Telephone Encounter (Signed)
Refill done.  

## 2015-04-19 ENCOUNTER — Other Ambulatory Visit: Payer: Self-pay | Admitting: Family Medicine

## 2015-04-22 ENCOUNTER — Ambulatory Visit: Payer: Self-pay | Admitting: Family Medicine

## 2015-04-22 NOTE — Telephone Encounter (Signed)
Med filled.  

## 2015-05-02 ENCOUNTER — Ambulatory Visit: Payer: Self-pay | Admitting: Family Medicine

## 2015-05-05 ENCOUNTER — Telehealth: Payer: Self-pay | Admitting: Family Medicine

## 2015-05-05 NOTE — Telephone Encounter (Signed)
pre visit letter mailed 04/30/15

## 2015-05-20 ENCOUNTER — Telehealth: Payer: Self-pay | Admitting: Behavioral Health

## 2015-05-20 NOTE — Telephone Encounter (Signed)
Unable to reach patient at time of Pre-Visit Call.  Left message for patient to return call when available.    

## 2015-05-21 ENCOUNTER — Other Ambulatory Visit (HOSPITAL_COMMUNITY)
Admission: RE | Admit: 2015-05-21 | Discharge: 2015-05-21 | Disposition: A | Discharge status: Home / Self Care | Payer: 59 | Source: Ambulatory Visit | Attending: Family Medicine | Admitting: Family Medicine

## 2015-05-21 ENCOUNTER — Encounter: Payer: Self-pay | Admitting: Family Medicine

## 2015-05-21 ENCOUNTER — Other Ambulatory Visit: Payer: Self-pay | Admitting: General Practice

## 2015-05-21 ENCOUNTER — Ambulatory Visit (INDEPENDENT_AMBULATORY_CARE_PROVIDER_SITE_OTHER): Payer: 59 | Admitting: Family Medicine

## 2015-05-21 ENCOUNTER — Encounter: Payer: 59 | Admitting: Family Medicine

## 2015-05-21 VITALS — BP 122/84 | HR 80 | Temp 98.3°F | Resp 16 | Ht 66.0 in | Wt 166.1 lb

## 2015-05-21 DIAGNOSIS — Z01419 Encounter for gynecological examination (general) (routine) without abnormal findings: Secondary | ICD-10-CM | POA: Insufficient documentation

## 2015-05-21 DIAGNOSIS — Z1151 Encounter for screening for human papillomavirus (HPV): Secondary | ICD-10-CM | POA: Diagnosis present

## 2015-05-21 DIAGNOSIS — Z124 Encounter for screening for malignant neoplasm of cervix: Secondary | ICD-10-CM

## 2015-05-21 DIAGNOSIS — Z1211 Encounter for screening for malignant neoplasm of colon: Secondary | ICD-10-CM | POA: Diagnosis not present

## 2015-05-21 LAB — LIPID PANEL
CHOL/HDL RATIO: 2
CHOLESTEROL: 253 mg/dL — AB (ref 0–200)
HDL: 101.9 mg/dL (ref >39.00)
LDL CALC: 142 mg/dL — AB (ref 0–99)
NonHDL: 150.85
Triglycerides: 43 mg/dL (ref 0.0–149.0)
VLDL: 8.6 mg/dL (ref 0.0–40.0)

## 2015-05-21 LAB — BASIC METABOLIC PANEL
BUN: 14 mg/dL (ref 6–23)
CALCIUM: 9.7 mg/dL (ref 8.4–10.5)
CO2: 32 meq/L (ref 19–32)
CREATININE: 0.77 mg/dL (ref 0.40–1.20)
Chloride: 102 mEq/L (ref 96–112)
GFR: 84.12 mL/min (ref >60.00)
Glucose, Bld: 86 mg/dL (ref 70–99)
Potassium: 4.2 mEq/L (ref 3.5–5.1)
Sodium: 140 mEq/L (ref 135–145)

## 2015-05-21 LAB — HEPATIC FUNCTION PANEL
ALK PHOS: 60 U/L (ref 39–117)
ALT: 17 U/L (ref 0–35)
AST: 17 U/L (ref 0–37)
Albumin: 4.2 g/dL (ref 3.5–5.2)
BILIRUBIN DIRECT: 0.1 mg/dL (ref 0.0–0.3)
BILIRUBIN TOTAL: 0.5 mg/dL (ref 0.2–1.2)
TOTAL PROTEIN: 7.2 g/dL (ref 6.0–8.3)

## 2015-05-21 LAB — CBC WITH DIFFERENTIAL/PLATELET
Basophils Absolute: 0 10*3/uL (ref 0.0–0.1)
Basophils Relative: 0.3 % (ref 0.0–3.0)
Eosinophils Absolute: 0.2 10*3/uL (ref 0.0–0.7)
Eosinophils Relative: 2.7 % (ref 0.0–5.0)
HCT: 42.8 % (ref 36.0–46.0)
Hemoglobin: 14.1 g/dL (ref 12.0–15.0)
Lymphocytes Relative: 21.9 % (ref 12.0–46.0)
Lymphs Abs: 1.3 10*3/uL (ref 0.7–4.0)
MCHC: 33 g/dL (ref 30.0–36.0)
MCV: 91.2 fl (ref 78.0–100.0)
Monocytes Absolute: 0.5 10*3/uL (ref 0.1–1.0)
Monocytes Relative: 8.2 % (ref 3.0–12.0)
Neutro Abs: 4 10*3/uL (ref 1.4–7.7)
Neutrophils Relative %: 66.9 % (ref 43.0–77.0)
Platelets: 305 10*3/uL (ref 150.0–400.0)
RBC: 4.7 Mil/uL (ref 3.87–5.11)
RDW: 13.8 % (ref 11.5–15.5)
WBC: 5.9 10*3/uL (ref 4.0–10.5)

## 2015-05-21 LAB — TSH: TSH: 1.76 u[IU]/mL (ref 0.35–4.50)

## 2015-05-21 LAB — VITAMIN D 25 HYDROXY (VIT D DEFICIENCY, FRACTURES): VITD: 42.98 ng/mL (ref 30.00–100.00)

## 2015-05-21 MED ORDER — MONTELUKAST SODIUM 10 MG PO TABS: ORAL_TABLET | ORAL | Status: DC

## 2015-05-21 MED ORDER — FLUTICASONE PROPIONATE 50 MCG/ACT NA SUSP: 2.0000 | Freq: Every day | NASAL | Status: DC

## 2015-05-21 MED ORDER — FLUOXETINE HCL 10 MG PO CAPS: ORAL_CAPSULE | ORAL | Status: DC

## 2015-05-21 NOTE — Assessment & Plan Note (Signed)
Pt's PE WNL.  Due for mammo- scheduled in Sept.  Colonoscopy referral entered.  Check labs.  Anticipatory guidance provided.

## 2015-05-21 NOTE — Progress Notes (Signed)
Pre visit review using our clinic review tool, if applicable. No additional management support is needed unless otherwise documented below in the visit note. 

## 2015-05-21 NOTE — Patient Instructions (Signed)
Follow up in 1 year or as needed We'll notify you of your lab results and make any changes if needed We'll call you with your GI appt for the colonoscopy consultation Keep up the good work on healthy diet and regular exercise- you look great! Please send me a copy of your mammogram report Call with any questions or concerns Enjoy the rest of your summer!

## 2015-05-21 NOTE — Assessment & Plan Note (Signed)
Pap collected. 

## 2015-05-21 NOTE — Progress Notes (Signed)
   Subjective:    Patient ID: Jillian Mcknight, female    DOB: 12/02/1965, 49 y.o.   MRN: [id number]  HPI CPE- due for colonoscopy, mammo- scheduled Sept 14th ([facility]).  UTD on pap but pt desires yearly paps.   Review of Systems Patient reports no vision/ hearing changes, adenopathy,fever, weight change,  persistant/recurrent hoarseness , swallowing issues, chest pain, palpitations, edema, persistant/recurrent cough, hemoptysis, dyspnea (rest/exertional/paroxysmal nocturnal), gastrointestinal bleeding (melena, rectal bleeding), abdominal pain, significant heartburn, bowel changes, GU symptoms (dysuria, hematuria, incontinence), Gyn symptoms (abnormal  bleeding, pain),  syncope, focal weakness, memory loss, numbness & tingling, skin/hair/nail changes, abnormal bruising or bleeding, anxiety, or depression.     Objective:   Physical Exam  General Appearance:    Alert, cooperative, no distress, appears stated age  Head:    Normocephalic, without obvious abnormality, atraumatic  Eyes:    PERRL, conjunctiva/corneas clear, EOM's intact, fundi    benign, both eyes  Ears:    Normal TM's and external ear canals, both ears  Nose:   Nares normal, septum midline, mucosa normal, no drainage    or sinus tenderness  Throat:   Lips, mucosa, and tongue normal; teeth and gums normal  Neck:   Supple, symmetrical, trachea midline, no adenopathy;    Thyroid: no enlargement/tenderness/nodules  Back:     Symmetric, no curvature, ROM normal, no CVA tenderness  Lungs:     Clear to auscultation bilaterally, respirations unlabored  Chest Wall:    No tenderness or deformity   Heart:    Regular rate and rhythm, S1 and S2 normal, no murmur, rub   or gallop  Breast Exam:    No tenderness, masses, or nipple abnormality  Abdomen:     Soft, non-tender, bowel sounds active all four quadrants,    no masses, no organomegaly  Genitalia:    External genitalia normal, cervix normal in appearance, no CMT, uterus in  normal size and position, adnexa w/out mass or tenderness, mucosa pink and moist, no lesions or discharge present  Rectal:    Normal external appearance  Extremities:   Extremities normal, atraumatic, no cyanosis or edema  Pulses:   2+ and symmetric all extremities  Skin:   Skin color, texture, turgor normal, no rashes or lesions  Lymph nodes:   Cervical, supraclavicular, and axillary nodes normal  Neurologic:   CNII-XII intact, normal strength, sensation and reflexes    throughout          Assessment & Plan:

## 2015-05-22 LAB — CYTOLOGY - PAP

## 2015-06-16 ENCOUNTER — Ambulatory Visit (INDEPENDENT_AMBULATORY_CARE_PROVIDER_SITE_OTHER): Payer: 59 | Admitting: Internal Medicine

## 2015-06-16 ENCOUNTER — Encounter: Payer: Self-pay | Admitting: Internal Medicine

## 2015-06-16 VITALS — BP 114/76 | HR 78 | Ht 66.0 in | Wt 165.2 lb

## 2015-06-16 DIAGNOSIS — G4711 Idiopathic hypersomnia with long sleep time: Secondary | ICD-10-CM | POA: Diagnosis not present

## 2015-06-16 MED ORDER — AMPHETAMINE-DEXTROAMPHETAMINE 10 MG PO TABS: 10.0000 mg | ORAL_TABLET | Freq: Two times a day (BID) | ORAL | Status: DC

## 2015-06-16 NOTE — Patient Instructions (Signed)
Script refilled for Adderall. You can spread your dose out over the day as needed.

## 2015-06-16 NOTE — Progress Notes (Signed)
Subjective:    Patient ID: Jillian Mcknight, female    DOB: Jun 17, 1966, 49 y.o.   MRN: [id number]  HPI 12/27/13- 45 yoF- Self referral. Pt states she never feels rested. Pts husband states she has periods of apnea during the night. Sleep study done in 2011. EPWORTH= 15 Patient describes nonrestorative sleep. Falls asleep easily at around 10 PM, waking briefly 2 or 3 times before up at 5:15 AM. No nap opportunity but feels that she could fall asleep any time if she were quiet. Rare sleep paralysis and no cataplexy recognized. Her husband tells her she snores loudly. NPSG in 2011 by [facility] showed apnea index less than 1. She does not take sedating medicines. One cup of coffee daily. Rare Benadryl for sleep. No ENT surgery. Normal thyroid function. No head injury. Father is remembered as being sleepy.  02/28/14- 69 yoF  [name] followed for idiopathic hypersomnia FOLLOWS FOR:  Discuss sleep study results. Excessive sleepiness since young adult. Occasional sleep paralysis, no cataplexy. 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 NPSG 02/10/14- AHI 0.9/ hr, WNL, loud snoring, weight 169 lbs MSLT 02/11/14- Mean latency 8.1 minutes, REM 0/5 naps.  06/11/14- 1 yoF  [name] followed for idiopathic hypersomnia FOLLOWS FOR: Adderall is helping with her staying awake.  12/16/14- 33 yoF never smoker [name] followed for idiopathic hypersomnia FOLLOWS FOR: Is currently on Adderall 10 XR mg-would like to discuss other options for treatment or lower her dose if possible.  Okay at work. Can sleep if sits quietly. Usually feels quite active. Heart rate stays a little high without palpitation. Tends to wake once or twice during the night and sometimes has trouble getting back to sleep. Thinks this pattern started before her sleep study and was not a result of the Adderall. Provigil worked better but it interfered with her birth control. We discussed a trial of using short-acting Adderall instead of  the extended release.  06/16/15-48 yoF never smoker [name] followed for idiopathic hypersomnia FOLLOWS FOR: Pt states Adderall 10mg  seems to be working; would like to get Rx printed today -will not fill for another 2-3 weeks though.  She does better with 10 mg rather than 5 mg of Adderall most days. Confirmed menopausal status and we discussed impact on sleep.  Review of Systems  Constitutional: Negative for fever and unexpected weight change.  HENT: Negative for congestion, dental problem, ear pain, nosebleeds, postnasal drip, rhinorrhea, sinus pressure, sneezing, sore throat and trouble swallowing.   Eyes: Negative for redness and itching.  Respiratory: Negative for cough, chest tightness, shortness of breath and wheezing.   Cardiovascular: Negative for palpitations and leg swelling.  Gastrointestinal: Negative for nausea and vomiting.  Genitourinary: Negative for dysuria.  Musculoskeletal: Negative for joint swelling.  Skin: Negative for rash.  Neurological: Negative for headaches.  Hematological: Does not bruise/bleed easily.  Psychiatric/Behavioral: Negative for dysphoric mood. The patient is not nervous/anxious.       Objective:   Physical Exam OBJ- Physical Exam General- Alert, Oriented, Affect-appropriate, Distress- none acute, normal weight Skin- rash-none, lesions- none, excoriation- none Lymphadenopathy- none Head- atraumatic            Eyes- Gross vision intact, PERRLA, conjunctivae and secretions clear            Ears- Hearing, canals-normal            Nose- Clear, no-Septal dev, mucus, polyps, erosion, perforation             Throat- Mallampati  III , mucosa clear , drainage- none, tonsils- atrophic Neck- flexible , trachea midline, no stridor , thyroid nl, carotid no bruit Chest - symmetrical excursion , unlabored           Heart/CV- RRR , no murmur , no gallop  , no rub, nl s1 s2                           - JVD- none , edema- none, stasis changes- none,  varices- none           Lung- clear to P&A, wheeze- none, cough- none , dullness-none, rub- none           Chest wall-  Abd-  Br/ Gen/ Rectal- Not done, not indicated Extrem- cyanosis- none, clubbing, none, atrophy- none, strength- nl Neuro- grossly intact to observation    Assessment & Plan:

## 2015-06-17 NOTE — Assessment & Plan Note (Signed)
She remains very comfortable using Adderall 10 mg most days. She is careful about naps and sleep habits. We don't need to make changes now. She does not seem to have cataplexy or sleep paralysis. She seems to maintain alertness appropriately in situations like driving.

## 2015-07-05 ENCOUNTER — Ambulatory Visit (INDEPENDENT_AMBULATORY_CARE_PROVIDER_SITE_OTHER): Payer: 59 | Admitting: Family Medicine

## 2015-07-05 VITALS — BP 142/80 | HR 83 | Temp 98.4°F | Resp 18 | Ht 66.0 in | Wt 163.0 lb

## 2015-07-05 DIAGNOSIS — J069 Acute upper respiratory infection, unspecified: Secondary | ICD-10-CM

## 2015-07-05 DIAGNOSIS — R05 Cough: Secondary | ICD-10-CM

## 2015-07-05 DIAGNOSIS — J0100 Acute maxillary sinusitis, unspecified: Secondary | ICD-10-CM | POA: Diagnosis not present

## 2015-07-05 DIAGNOSIS — R059 Cough, unspecified: Secondary | ICD-10-CM

## 2015-07-05 MED ORDER — HYDROCODONE-HOMATROPINE 5-1.5 MG/5ML PO SYRP: 5.0000 mL | ORAL_SOLUTION | ORAL | Status: DC | PRN

## 2015-07-05 MED ORDER — BENZONATATE 100 MG PO CAPS: 100.0000 mg | ORAL_CAPSULE | Freq: Three times a day (TID) | ORAL | Status: DC | PRN

## 2015-07-05 MED ORDER — IPRATROPIUM BROMIDE 0.03 % NA SOLN: 2.0000 | Freq: Two times a day (BID) | NASAL | Status: DC

## 2015-07-05 MED ORDER — AMOXICILLIN-POT CLAVULANATE 875-125 MG PO TABS: 1.0000 | ORAL_TABLET | Freq: Two times a day (BID) | ORAL | Status: DC

## 2015-07-05 NOTE — Patient Instructions (Signed)
Drink plenty of fluids and get enough rest  Take Augmentin 875 one twice daily  Use the Atrovent nasal spray 2 sprays 3-4 times daily in each nostril as needed for headache congestion  Take the benzonatate cough pills one or 2 pills 3 times daily as needed for daytime cough  Take the Hycodan cough syrup 1 teaspoon every 4 hours as needed for nighttime cough (is sedating)  Return if not improving or if worse at anytime

## 2015-07-05 NOTE — Progress Notes (Signed)
Patient ID: Jillian Mcknight, female    DOB: 05-21-1966  Age: 49 y.o. MRN: [id number]  Chief Complaint  Patient presents with  . Sore Throat  . Nasal Congestion  . Facial Pain    sinus pressure   . 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 After that she got ill a few days later with headache congestion. It is persisted with headache congestion and cough and some sore throat. No ear problems. She has some yellow purulent looking sputum. She continues to have facial pain. Actually the face pain and had symptoms have gotten worse rather than better. She has a history of upper or infections being followed by a sinusitis. She does not smoke. She is generally a healthy lady.  Current allergies, medications, problem list, past/family and social histories reviewed.  Objective:  BP 142/80 mmHg  Pulse 83  Temp(Src) 98.4 F (36.9 C) (Oral)  Resp 18  Ht 5\' 6"  (1.676 m)  Wt 163 lb (73.936 kg)  BMI 26.32 kg/m2  SpO2 99%  No major acute distress. Her TMs are normal. She's tender over maxillary and frontal sinuses. Knows little congested throat mildly erythematous. Neck supple without significant nodes. Chest is clear to auscultation. Heart regular without murmurs.  Assessment & Plan:   Assessment: 1. Acute maxillary sinusitis, recurrence not specified   2. Acute upper respiratory infection   3. Cough       Plan: No orders of the defined types were placed in this encounter.    Meds ordered this encounter  Medications  . ipratropium (ATROVENT) 0.03 % nasal spray    Sig: Place 2 sprays into both nostrils 2 (two) times daily.    Dispense:  30 mL    Refill:  0  . amoxicillin-clavulanate (AUGMENTIN) 875-125 MG per tablet    Sig: Take 1 tablet by mouth 2 (two) times daily.    Dispense:  28 tablet    Refill:  0  . benzonatate (TESSALON) 100 MG capsule    Sig: Take 1-2 capsules (100-200 mg  total) by mouth 3 (three) times daily as needed.    Dispense:  30 capsule    Refill:  0  . HYDROcodone-homatropine (HYCODAN) 5-1.5 MG/5ML syrup    Sig: Take 5 mLs by mouth every 4 (four) hours as needed.    Dispense:  120 mL    Refill:  0      No testing will be done, treat symptomatically    Patient Instructions  Drink plenty of fluids and get enough rest  Take Augmentin 875 one twice daily  Use the Atrovent nasal spray 2 sprays 3-4 times daily in each nostril as needed for headache congestion  Take the benzonatate cough pills one or 2 pills 3 times daily as needed for daytime cough  Take the Hycodan cough syrup 1 teaspoon every 4 hours as needed for nighttime cough (is sedating)  Return if not improving or if worse at anytime     Return if symptoms worsen or fail to improve.   HOPPER,DAVID, MD 07/05/2015

## 2015-07-21 ENCOUNTER — Encounter: Payer: Self-pay | Admitting: Internal Medicine

## 2015-09-26 ENCOUNTER — Encounter: Payer: Self-pay | Admitting: Internal Medicine

## 2015-10-07 ENCOUNTER — Encounter: Payer: Self-pay | Admitting: Gastroenterology

## 2015-10-14 IMAGING — MG MM SCREEN MAMMOGRAM BILATERAL
4 series · 4 of 4 positions shown · non-contrast
Comparison: Previous exam(s).

CLINICAL DATA: Screening.

EXAM:
DIGITAL SCREENING BILATERAL MAMMOGRAM WITH CAD

[R CC]
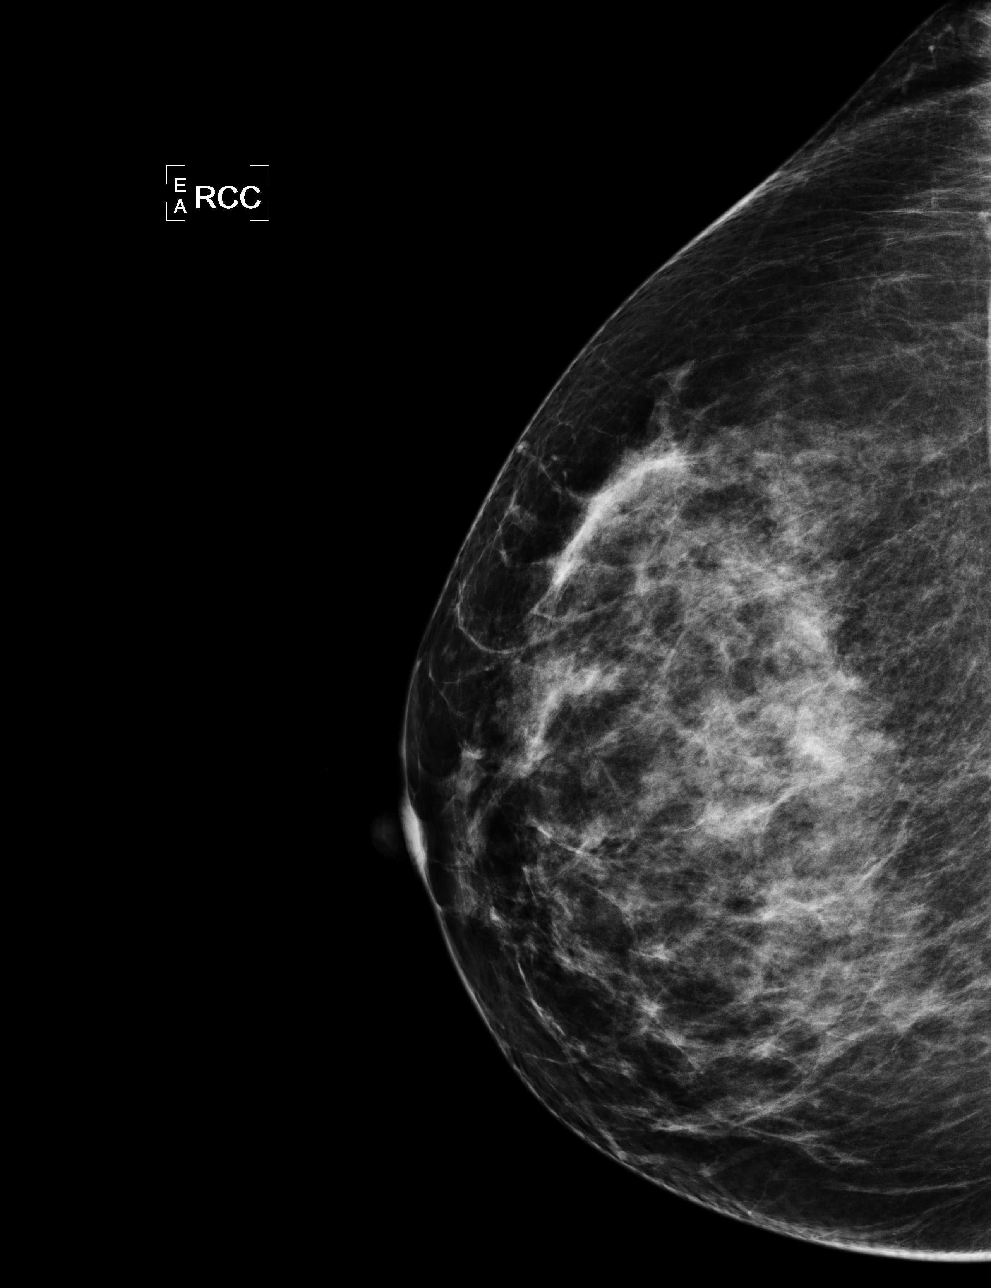

[L CC]
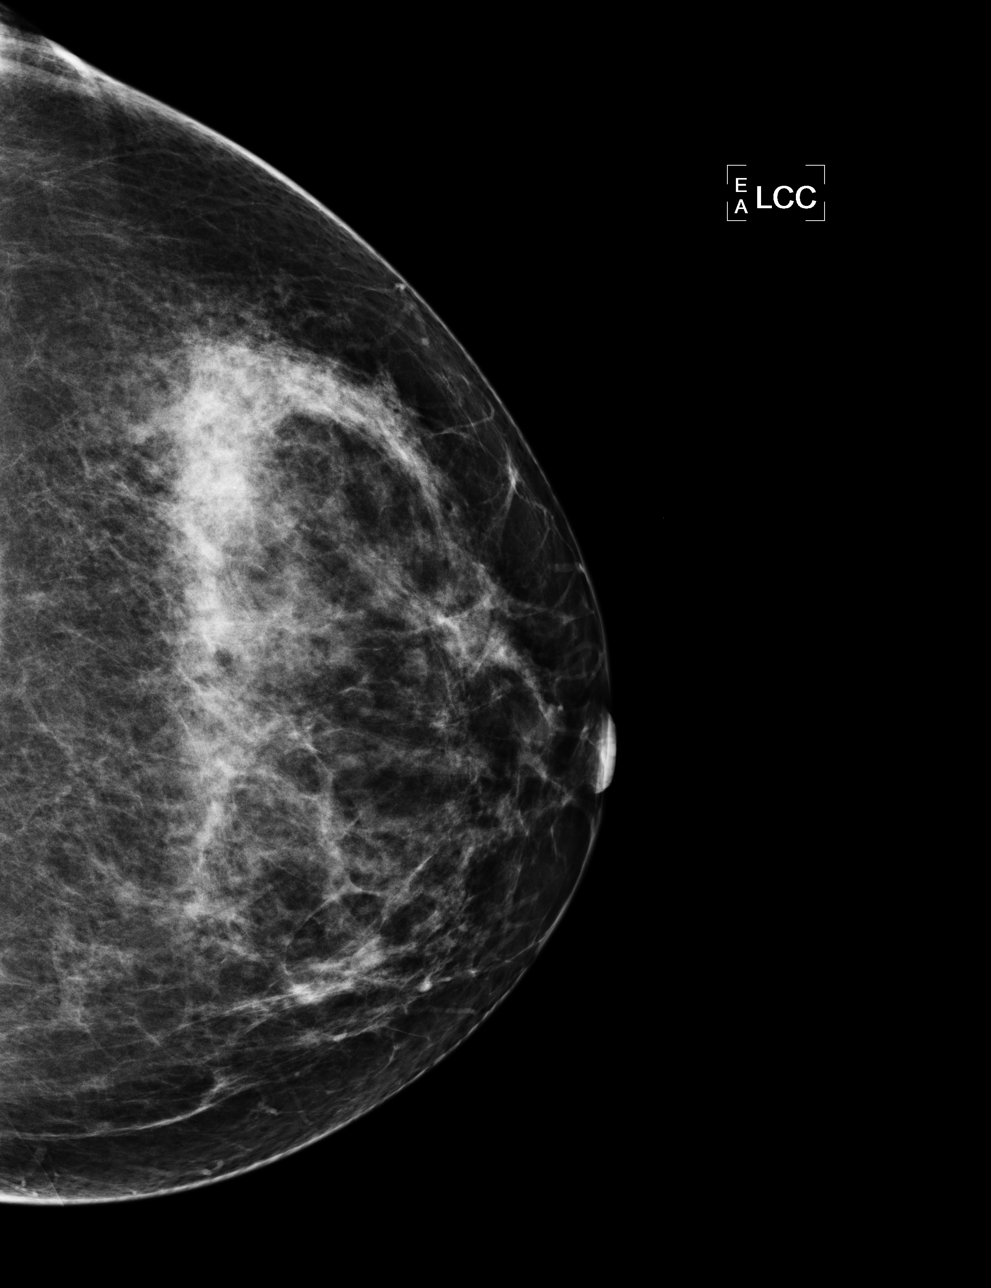

[L MLO]
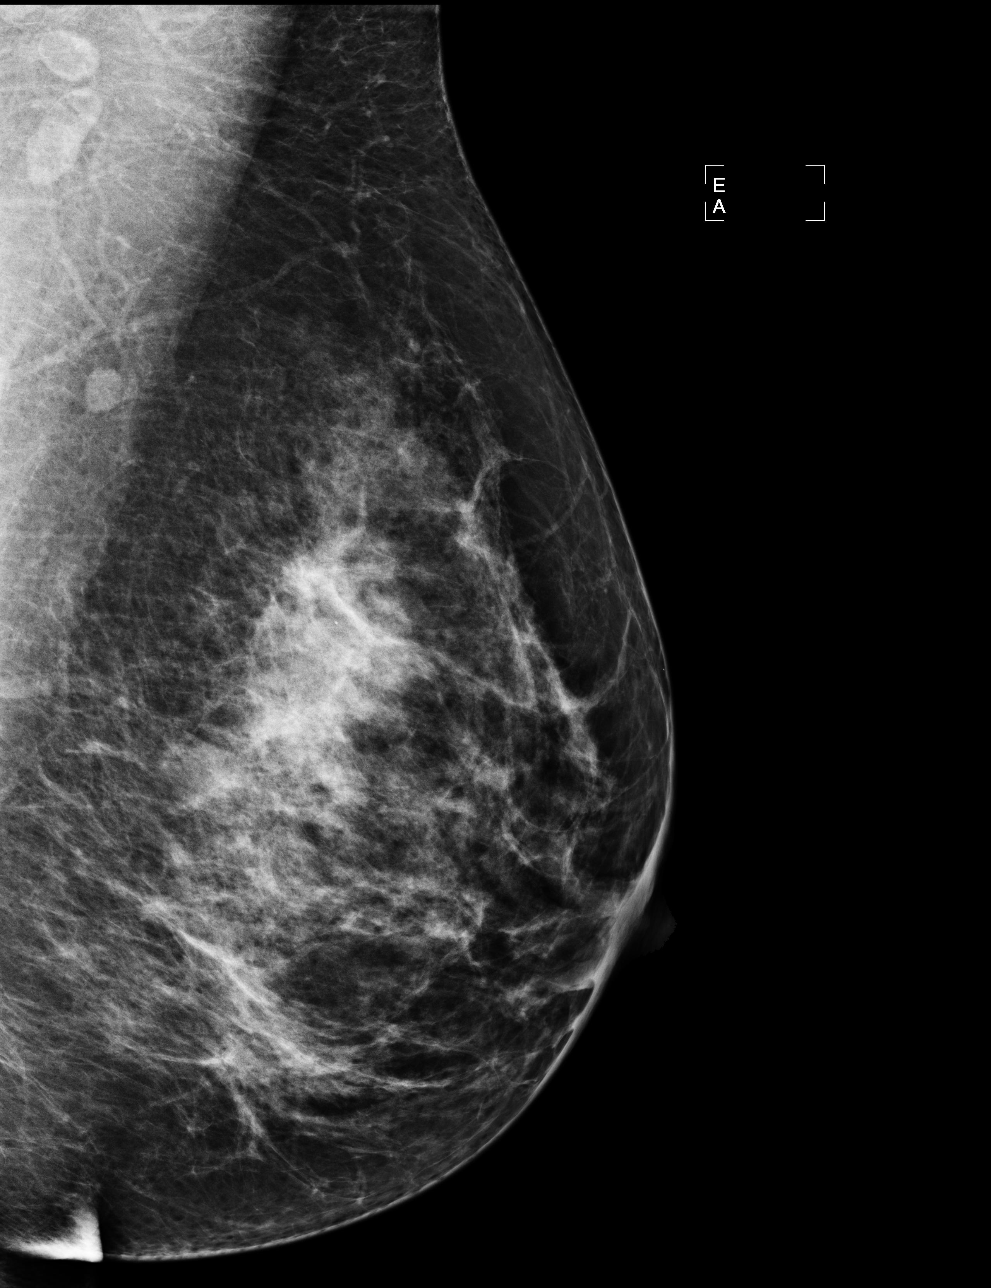

[R MLO]
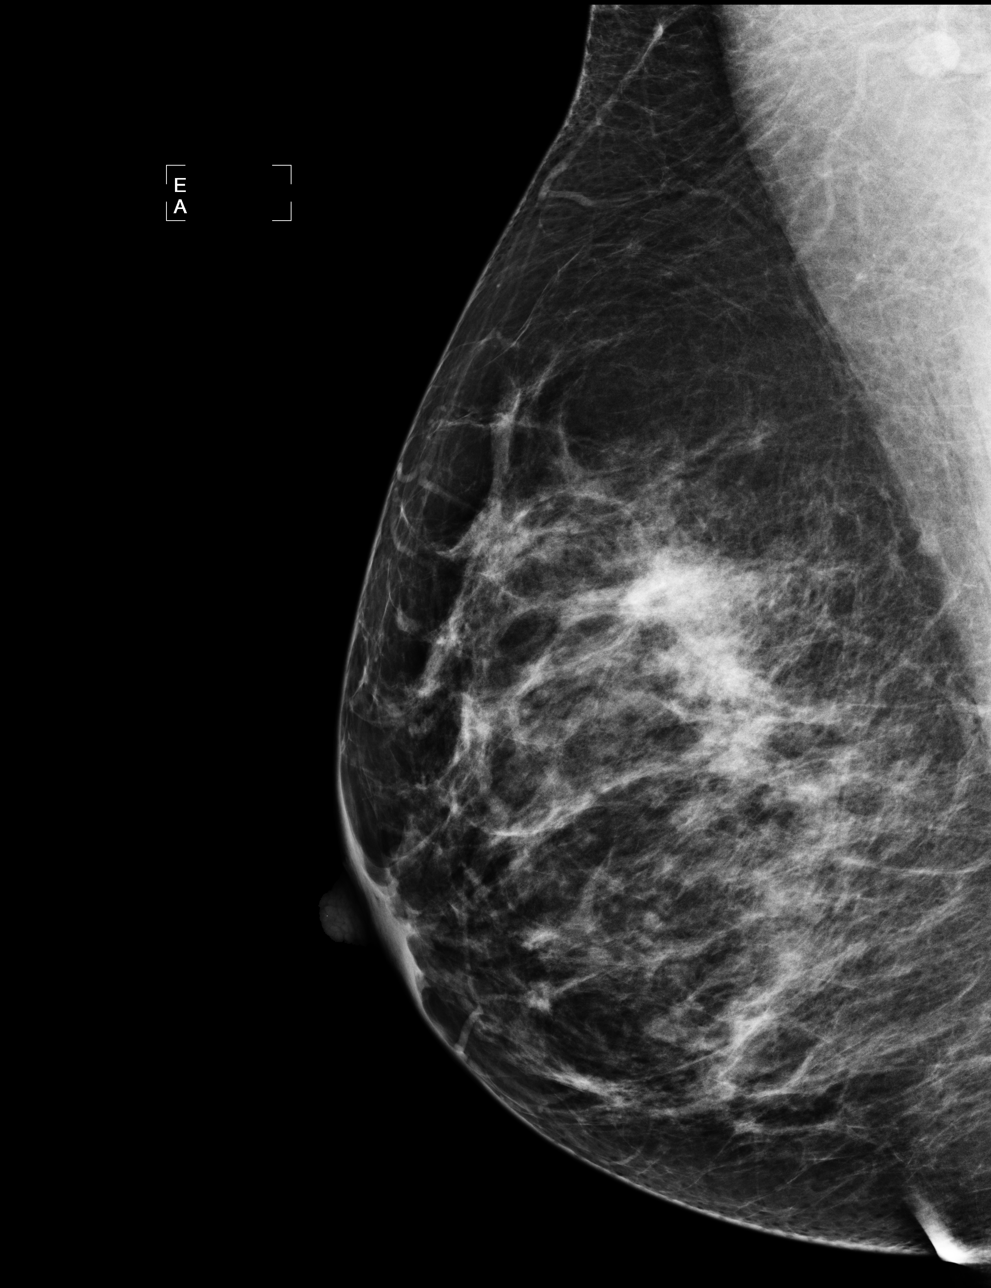

[4 of 4 positions shown; findings below may reference images not displayed]

ACR Breast Density Category c: The breast tissue is heterogeneously
dense, which may obscure small masses.
FINDINGS: There are no findings suspicious for malignancy. Images were
processed with CAD.
IMPRESSION: No mammographic evidence of malignancy. A result letter of this
screening mammogram will be mailed directly to the patient.

RECOMMENDATION:
Screening mammogram in one year. (Code:[id number])

BI-RADS CATEGORY  1: Negative.

## 2015-10-17 ENCOUNTER — Other Ambulatory Visit (INDEPENDENT_AMBULATORY_CARE_PROVIDER_SITE_OTHER): Payer: 59

## 2015-10-17 ENCOUNTER — Ambulatory Visit (INDEPENDENT_AMBULATORY_CARE_PROVIDER_SITE_OTHER): Payer: 59 | Admitting: Family Medicine

## 2015-10-17 ENCOUNTER — Encounter: Payer: Self-pay | Admitting: Family Medicine

## 2015-10-17 VITALS — BP 118/80 | HR 94 | Ht 66.0 in | Wt 167.0 lb

## 2015-10-17 DIAGNOSIS — G5601 Carpal tunnel syndrome, right upper limb: Secondary | ICD-10-CM

## 2015-10-17 NOTE — Progress Notes (Signed)
[facility] [address] [place], [place] [address] Phone: [phone] Subjective:   CC: Right wrist pain  [id number] Jillian Mcknight is a 49 y.o. female coming in with complaint of right wrist pain.  Patient states that this is associated with numbness of her fingers to her ring finger. This does not affect the pinky finger.  Patient was last seen 6 months ago and was diagnosed with carpal tunnel and was given an injection. An states that this pain is only started coming back over the course last week. Patient states is starting give her numbness and making it difficult to work. Patient states it is even wake her up at night. She does wear the brace occasionally and it does help. No new symptoms is worsening a previous symptoms.         Past medical history, social, surgical and family history all reviewed in electronic medical record.   Review of Systems: No headache, visual changes, nausea, vomiting, diarrhea, constipation, dizziness, abdominal pain, skin rash, fevers, chills, night sweats, weight loss, swollen lymph nodes, body aches, joint swelling, muscle aches, chest pain, shortness of breath, mood changes.   Objective Blood pressure 118/80, pulse 94, height 5\' 6"  (1.676 m), weight 167 lb (75.751 kg), SpO2 95 %.  General: No apparent distress alert and oriented x3 mood and affect normal, dressed appropriately.  HEENT: Pupils equal, extraocular movements intact  Respiratory: Patient's speak in full sentences and does not appear short of breath  Cardiovascular: No lower extremity edema, non tender, no erythema  Skin: Warm dry intact with no signs of infection or rash on extremities or on axial skeleton.  Abdomen: Soft nontender  Neuro: Cranial nerves II through XII are intact, neurovascularly intact in all extremities with 2+ DTRs and 2+ pulses.  Lymph: No lymphadenopathy of posterior or anterior cervical chain or axillae bilaterally.  Gait normal  with good balance and coordination.  MSK:  Non tender with full range of motion and good stability and symmetric strength and tone of shoulders, elbows, hip, knee and ankles bilaterally.  Wrist: Right Inspection normal with no visible erythema or swelling. ROM smooth and normal with good flexion and extension and ulnar/radial deviation that is symmetrical with opposite wrist. Palpation is normal over metacarpals, navicular, lunate, and TFCC; tendons without tenderness/ swelling No snuffbox tenderness. No tenderness over Canal of Guyon. Mild weakness of pincer compared to the contralateral side Negative Finkelstein, positive tinel's and phalens. Negative Watson's test. Contralateral wrist unremarkable   MSK US performed of: Right This study was ordered, performed, and interpreted by Charlann Boxer D.O.  Wrist: All extensor compartments visualized and tendons all normal in appearance without fraying, tears, or sheath effusions. No effusion seen. TFCC intact. Scapholunate ligament intact. Carpal tunnel visualized and median nerve area increase in size to 1.64 cm compared to 1.0 cm on the contralateral side. Power doppler signal normal.  IMPRESSION:  Carpal tunnel syndrome  Procedure: Real-time Ultrasound Guided Injection of right carpal tunnel Device: GE Logiq E  Ultrasound guided injection is preferred based studies that show increased duration, increased effect, greater accuracy, decreased procedural pain, increased response rate with ultrasound guided versus blind injection.  Verbal informed consent obtained.  Time-out conducted.  Noted no overlying erythema, induration, or other signs of local infection.  Skin prepped in a sterile fashion.  Local anesthesia: Topical Ethyl chloride.  With sterile technique and under real time ultrasound guidance:  median nerve visualized.  23g 5/8 inch needle inserted distal  to proximal approach into nerve sheath. Pictures taken nfor needle placement.  Patient did have injection of 2 cc of 1% lidocaine, 1 cc of 0.5% Marcaine, and 1 cc of Kenalog 40 mg/dL. Completed without difficulty  Pain immediately resolved suggesting accurate placement of the medication.  Advised to call if fevers/chills, erythema, induration, drainage, or persistent bleeding.  Images permanently stored and available for review in the ultrasound unit.  Impression: Technically successful ultrasound guided injection.     Impression and Recommendations:     This case required medical decision making of moderate complexity.

## 2015-10-17 NOTE — Progress Notes (Signed)
Pre visit review using our clinic review tool, if applicable. No additional management support is needed unless otherwise documented below in the visit note. 

## 2015-10-17 NOTE — Patient Instructions (Signed)
Good to see you Wear brace day and night for 1 week then nightly for 2 weeks Ice is your friend pennsaid pinkie amount topically 2 times daily as needed.  See me again  When you need me.  Happy New Year!

## 2015-10-17 NOTE — Assessment & Plan Note (Signed)
Responded fairly well to the injection. We discussed icing regimen and home exercises. We discussed which activities to do an which was potentially avoid. Patient will continue to monitor for any type of symptoms. Patient does not last 3 months we do need to consider surgical options. Discussed this in greater detail. Do not feel an EMG as needed with the diameter seen on ultrasound. Given trial topical anti-inflammatories. Patient given a repeat prescription of the meloxicam. Patient will come back and see me again if another injection is necessary or not responding to conservative therapy.  Spent  25 minutes with patient face-to-face and had greater than 50% of counseling including as described above in assessment and plan.

## 2015-11-14 ENCOUNTER — Ambulatory Visit (AMBULATORY_SURGERY_CENTER): Payer: Self-pay

## 2015-11-14 VITALS — Ht 66.0 in | Wt 164.0 lb

## 2015-11-14 DIAGNOSIS — Z1211 Encounter for screening for malignant neoplasm of colon: Secondary | ICD-10-CM

## 2015-11-14 MED ORDER — SUPREP BOWEL PREP KIT 17.5-3.13-1.6 GM/177ML PO SOLN: 1.0000 | Freq: Once | ORAL | Status: DC

## 2015-11-14 NOTE — Progress Notes (Signed)
No allergies to eggs or soy No past problems with anesthesia No home oxygen No diet/weight loss meds  Has email and internet; registered emmi 

## 2015-11-28 ENCOUNTER — Encounter: Payer: Self-pay | Admitting: Gastroenterology

## 2015-11-28 ENCOUNTER — Ambulatory Visit (AMBULATORY_SURGERY_CENTER): Payer: 59 | Admitting: Gastroenterology

## 2015-11-28 VITALS — BP 143/79 | HR 74 | Temp 97.2°F | Resp 16 | Ht 66.0 in | Wt 164.0 lb

## 2015-11-28 DIAGNOSIS — Z1211 Encounter for screening for malignant neoplasm of colon: Secondary | ICD-10-CM | POA: Diagnosis not present

## 2015-11-28 DIAGNOSIS — D125 Benign neoplasm of sigmoid colon: Secondary | ICD-10-CM

## 2015-11-28 DIAGNOSIS — K635 Polyp of colon: Secondary | ICD-10-CM | POA: Diagnosis not present

## 2015-11-28 MED ORDER — SODIUM CHLORIDE 0.9 % IV SOLN: 500.0000 mL | INTRAVENOUS | Status: DC

## 2015-11-28 NOTE — Patient Instructions (Addendum)
We will call you with your polyp results.  If it is an adenoma, which is a noncancerous polyp, we will recall you in 5 years for colonoscopy. If it is a hyperplastic polyp we will recall you in 10 years.  YOU HAD AN ENDOSCOPIC PROCEDURE TODAY AT [facility]:   Refer to the procedure report that was given to you for any specific questions about what was found during the examination.  If the procedure report does not answer your questions, please call your gastroenterologist to clarify.  If you requested that your care partner not be given the details of your procedure findings, then the procedure report has been included in a sealed envelope for you to review at your convenience later.  YOU SHOULD EXPECT: Some feelings of bloating in the abdomen. Passage of more gas than usual.  Walking can help get rid of the air that was put into your GI tract during the procedure and reduce the bloating. If you had a lower endoscopy (such as a colonoscopy or flexible sigmoidoscopy) you may notice spotting of blood in your stool or on the toilet paper. If you underwent a bowel prep for your procedure, you may not have a normal bowel movement for a few days.  Please Note:  You might notice some irritation and congestion in your nose or some drainage.  This is from the oxygen used during your procedure.  There is no need for concern and it should clear up in a day or so.  SYMPTOMS TO REPORT IMMEDIATELY:   Following lower endoscopy (colonoscopy or flexible sigmoidoscopy):  Excessive amounts of blood in the stool  Significant tenderness or worsening of abdominal pains  Swelling of the abdomen that is new, acute  Fever of 100F or higher   For urgent or emergent issues, a gastroenterologist can be reached at any hour by calling [phone].   DIET: Your first meal following the procedure should be a small meal and then it is ok to progress to your normal diet. Heavy or fried foods are harder to  digest and may make you feel nauseous or bloated.  Likewise, meals heavy in dairy and vegetables can increase bloating.  Drink plenty of fluids but you should avoid alcoholic beverages for 24 hours.  ACTIVITY:  You should plan to take it easy for the rest of today and you should NOT DRIVE or use heavy machinery until tomorrow (because of the sedation medicines used during the test).    FOLLOW UP: Our staff will call the number listed on your records the next business day following your procedure to check on you and address any questions or concerns that you may have regarding the information given to you following your procedure. If we do not reach you, we will leave a message.  However, if you are feeling well and you are not experiencing any problems, there is no need to return our call.  We will assume that you have returned to your regular daily activities without incident.  If any biopsies were taken you will be contacted by phone or by letter within the next 1-3 weeks.  Please call us at [phone] if you have not heard about the biopsies in 3 weeks.    SIGNATURES/CONFIDENTIALITY: You and/or your care partner have signed paperwork which will be entered into your electronic medical record.  These signatures attest to the fact that that the information above on your After Visit Summary has been reviewed and is  understood.  Full responsibility of the confidentiality of this discharge information lies with you and/or your care-partner. 

## 2015-11-28 NOTE — Op Note (Signed)
[facility]  [address] [place], [address]   COLONOSCOPY PROCEDURE REPORT  PATIENT: Jillian Mcknight, Jillian Mcknight  MR#: [id number] BIRTHDATE: 1966/06/22 , 49  yrs. old GENDER: female ENDOSCOPIST: Wilfrid Lund, MD REFERRED [id number] Birdie Riddle, M.D. PROCEDURE DATE:  11/28/2015 PROCEDURE:   Colonoscopy, screening and Colonoscopy with snare polypectomy  ASA CLASS:   Class II INDICATIONS:Colorectal Neoplasm Risk Assessment for this procedure is average risk. MEDICATIONS: Monitored anesthesia care and Propofol 250 mg IV  DESCRIPTION OF PROCEDURE:   After the risks benefits and alternatives of the procedure were thoroughly explained, informed consent was obtained.  The digital rectal exam revealed no abnormalities of the rectum.   The LB [id number]  endoscope was introduced through the anus and advanced to the cecum, which was identified by both the appendix and ileocecal valve. No adverse events experienced.   The quality of the prep was excellent. (Suprep was used)  The instrument was then slowly withdrawn as the colon was fully examined. Estimated blood loss is zero unless otherwise noted in this procedure report.      COLON FINDINGS: There was mild diverticulosis noted in the sigmoid colon.   A sessile polyp measuring 2 mm in size was found in the distal sigmoid colon.  A polypectomy was performed with a cold snare.  The resection was complete, the polyp tissue was completely retrieved and sent to histology.  Retroflexed views revealed no abnormalities. The time to cecum = 16.9 Withdrawal time = 9.9   The scope was withdrawn and the procedure completed. COMPLICATIONS: There were no immediate complications.  ENDOSCOPIC IMPRESSION: 1.   Mild diverticulosis was noted in the sigmoid colon 2.   Sessile polyp was found in the distal sigmoid colon; polypectomy was performed with a cold snare  RECOMMENDATIONS: Repeat colonoscopy in 5 years if polyp adenomatous;  otherwise 10 years  eSigned:  Wilfrid Lund, MD 11/28/2015 9:51 AM   cc:

## 2015-11-28 NOTE — Progress Notes (Signed)
Called to room to assist during endoscopic procedure.  Patient ID and intended procedure confirmed with present staff. Received instructions for my participation in the procedure from the performing physician.  

## 2015-11-28 NOTE — Progress Notes (Signed)
To recovery, report to Tyrell, RN, VSS. 

## 2015-12-01 ENCOUNTER — Telehealth: Payer: Self-pay | Admitting: *Deleted

## 2015-12-01 NOTE — Telephone Encounter (Signed)
  Follow up Call-  Call back number 11/28/2015  Post procedure Call Back phone  # [phone]  Permission to leave phone message Yes     Patient questions:  Do you have a fever, pain , or abdominal swelling? No. Pain Score  0 *  Have you tolerated food without any problems? Yes.    Have you been able to return to your normal activities? Yes.    Do you have any questions about your discharge instructions: Diet   No. Medications  No. Follow up visit  No.  Do you have questions or concerns about your Care? No.  Actions: * If pain score is 4 or above: No action needed, pain <4.

## 2015-12-02 ENCOUNTER — Encounter: Payer: Self-pay | Admitting: Gastroenterology

## 2015-12-09 ENCOUNTER — Encounter: Payer: Self-pay | Admitting: Gastroenterology

## 2015-12-15 ENCOUNTER — Ambulatory Visit: Payer: Self-pay | Admitting: Internal Medicine

## 2015-12-18 ENCOUNTER — Other Ambulatory Visit: Payer: Self-pay | Admitting: Family Medicine

## 2015-12-18 NOTE — Telephone Encounter (Signed)
Medication filled to pharmacy as requested.   

## 2015-12-19 ENCOUNTER — Other Ambulatory Visit: Payer: Self-pay | Admitting: Family Medicine

## 2015-12-19 NOTE — Telephone Encounter (Signed)
Medication filled to pharmacy as requested.   

## 2015-12-29 ENCOUNTER — Ambulatory Visit (INDEPENDENT_AMBULATORY_CARE_PROVIDER_SITE_OTHER): Payer: 59 | Admitting: Family Medicine

## 2015-12-29 ENCOUNTER — Other Ambulatory Visit (INDEPENDENT_AMBULATORY_CARE_PROVIDER_SITE_OTHER): Payer: 59

## 2015-12-29 ENCOUNTER — Encounter: Payer: Self-pay | Admitting: Family Medicine

## 2015-12-29 VITALS — BP 124/82 | HR 90 | Ht 66.0 in | Wt 166.0 lb

## 2015-12-29 DIAGNOSIS — M2550 Pain in unspecified joint: Secondary | ICD-10-CM

## 2015-12-29 DIAGNOSIS — M25532 Pain in left wrist: Secondary | ICD-10-CM

## 2015-12-29 DIAGNOSIS — M25562 Pain in left knee: Secondary | ICD-10-CM

## 2015-12-29 DIAGNOSIS — G8929 Other chronic pain: Secondary | ICD-10-CM | POA: Diagnosis not present

## 2015-12-29 DIAGNOSIS — M25531 Pain in right wrist: Secondary | ICD-10-CM | POA: Diagnosis not present

## 2015-12-29 DIAGNOSIS — M7052 Other bursitis of knee, left knee: Secondary | ICD-10-CM | POA: Insufficient documentation

## 2015-12-29 LAB — TSH: TSH: 1.56 u[IU]/mL (ref 0.35–4.50)

## 2015-12-29 LAB — SEDIMENTATION RATE: SED RATE: 6 mm/h (ref 0–22)

## 2015-12-29 LAB — T4, FREE: Free T4: 0.61 ng/dL (ref 0.60–1.60)

## 2015-12-29 LAB — C-REACTIVE PROTEIN: CRP: 0.2 mg/dL — AB (ref 0.5–20.0)

## 2015-12-29 NOTE — Progress Notes (Signed)
Pre visit review using our clinic review tool, if applicable. No additional management support is needed unless otherwise documented below in the visit note. 

## 2015-12-29 NOTE — Progress Notes (Signed)
[facility] [place], [place] [address] Phone: [phone] Subjective:   CC: Right wrist pain f/u   [id number] Jillian Mcknight is a 50 y.o. female coming in with complaint of right wrist pain.  Patient was found to have a carpal tunnel syndrome. Patient was given an injection of the right side. States that it seems to be doing much better overall. Still patient continues to have some dull throbbing aching pain in both wrists. Nothing that stops him from activity. Does have numbness that seems to alternate wrist in the morning but seems to avoid with activity. Concern that it is not completely resolved.  Patient is also complaining of left knee pain. Greater than year ago patient did have a meniscal tear. 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 States that when she extends her knee and makes a loud horrible popping sound. States that does not seem to hurt. Denies any locking or giving out on her. Continues to be active. Patient is more concerned because other people have told her she should have it checked out.  Past Medical History  Diagnosis Date  . Depression   . Allergy   . Headache, frequent episodic tension-type    Past Surgical History  Procedure Laterality Date  . Wisdom tooth extraction  1982   Social History  Substance Use Topics  . Smoking status: Never Smoker   . Smokeless tobacco: Never Used  . Alcohol Use: 0.0 oz/week    0 Standard drinks or equivalent per week     Comment: socially   No Known Allergies Family History  Problem Relation Age of Onset  . Heart disease Mother   . Stroke Mother   . Lymphoma Mother   . Alzheimer's disease Father   . Diabetes Paternal Grandmother   . Colitis Daughter   . Cancer Cousin   . Colon cancer Cousin 68    colon  . Colon cancer Cousin 70    colon   Family history of autoimmune disease in mother and daughter      Past medical history, social, surgical and family history all  reviewed in electronic medical record.   Review of Systems: No headache, visual changes, nausea, vomiting, diarrhea, constipation, dizziness, abdominal pain, skin rash, fevers, chills, night sweats, weight loss, swollen lymph nodes, body aches, joint swelling, muscle aches, chest pain, shortness of breath, mood changes.   Objective Blood pressure 124/82, pulse 90, height 5\' 6"  (1.676 m), weight 166 lb (75.297 kg), SpO2 98 %.  General: No apparent distress alert and oriented x3 mood and affect normal, dressed appropriately.  HEENT: Pupils equal, extraocular movements intact  Respiratory: Patient's speak in full sentences and does not appear short of breath  Cardiovascular: No lower extremity edema, non tender, no erythema  Skin: Warm dry intact with no signs of infection or rash on extremities or on axial skeleton.  Abdomen: Soft nontender  Neuro: Cranial nerves II through XII are intact, neurovascularly intact in all extremities with 2+ DTRs and 2+ pulses.  Lymph: No lymphadenopathy of posterior or anterior cervical chain or axillae bilaterally.  Gait normal with good balance and coordination.  MSK:  Non tender with full range of motion and good stability and symmetric strength and tone of shoulders, elbows, hip, and ankles bilaterally.  Wrist: Right Inspection normal with no visible erythema or swelling. ROM smooth and normal with good flexion and extension and ulnar/radial deviation that is symmetrical with opposite wrist. Palpation is normal over metacarpals, navicular,  lunate, and TFCC; tendons without tenderness/ swelling No snuffbox tenderness. No tenderness over Canal of Guyon. Mild weakness of pincer compared to the contralateral side Negative Finkelstein, negative Tinel's sign today. Negative Watson's test. Contralateral wrist unremarkable   Knee: Left  Normal to inspection with no erythema or effusion or obvious bony abnormalities. Mild tenderness to palpation over the lateral  joint line. ROM full in flexion and extension and lower leg rotation. Ligaments with solid consistent endpoints including ACL, PCL, LCL, MCL. Negative Mcmurray's, Apley's, and Thessalonian tests. Non painful patellar compression. Patellar glide with mild crepitus Patellar and quadriceps tendons unremarkable. Hamstring and quadriceps strength is normal.  Contralateral knee unremarkable  MSK US performed of: Right This study was ordered, performed, and interpreted by Charlann Boxer D.O.  Wrist: All extensor compartments visualized and tendons all normal in appearance without fraying, tears, or sheath effusions. No effusion seen. TFCC intact. Scapholunate ligament intact. Carpal tunnel and median nerve visualized but no significant inflammation or enlargement at this time. Patient does have surrounding fracture tendon hypoechoic changes within the tendon sheath it seems to be bilaterally when comparing. IMPRESSION nonspecific swelling of the flexor tendons bilaterally  MSK US performed of: Left knee This study was ordered, performed, and interpreted by Charlann Boxer D.O.  Knee: All structures visualized. Anteromedial, anterolateral, posteromedial, and posterolateral menisci unremarkable without tearing, fraying, effusion, or displacement. Mild narrowing of the joint lines Popliteal tendon has significant hypoechoic changes and mild subluxation noted. Patellar Tendon unremarkable on long and transverse views without effusion. No abnormality of prepatellar bursa. LCL and MCL unremarkable on long and transverse views. No abnormality of origin of medial or lateral head of the gastrocnemius.  IMPRESSION:  Popliteal tendinitis with mild subluxation    Impression and Recommendations:     This case required medical decision making of moderate complexity.

## 2015-12-29 NOTE — Assessment & Plan Note (Signed)
Patient is having more bilateral wrist pain. I do think that there is some swelling of the flexor tendons. I do think that this is concerning for possible systemic pathology. Patient does have family history of autoimmune diseases and to close relatives. I do feel that labs reported at this time. We discussed topical anti-inflammatories as well as continuing the oral anti-inflammatory's. We discussed icing. Monitoring diet. Patient will follow-up with me again in 4 weeks.

## 2015-12-29 NOTE — Assessment & Plan Note (Signed)
Patient does have more of a popliteal tendinitis with some mild subluxation. We discussed home exercise, compression, topical anti-inflammatories. Patient has oral anti-inflammatories that his been given previously. We discussed what ectasia doing which was avoid. Patient will come back and see me again in 4 weeks.

## 2015-12-29 NOTE — Patient Instructions (Signed)
Good to see you  We will get labs to make sure it is not a auto immune Duexis instead of meloxicam for next 6 days 3 times daily  Exercises for your knee.  Compression sleeve (tommy copper, etc) daily to the knee to help a little.  Turmeric 500mg  twice daily  Vitamin D 4000 IU daily for next month  See me again in 4 weeks and send me a message in 1 week with an update.    Popliteus Tendinitis With Rehab Tendonitis is a condition that is characterized by inflammation of a tendon. A tendon is the soft tissue that connects muscles to the skeletal system allowing for body movements. Popliteus tendonitis affects the popliteus tendon, which connects the popliteus muscle to the thigh bone (femur) near the knee. The popliteus muscle helps bend and rotate the knee. Popliteus tendonitis is often caused by a tendon tear (strain). Strains are classified into three categories. Grade 1 strains cause pain, but the tendon is not lengthened. Grade 2 strains include a lengthened ligament due to the ligament being stretched or partially ruptured. With grade 2 strains there is still function, although the function may be diminished. Grade 3 strains are characterized by a complete tear of the tendon or muscle, and function is usually impaired. SYMPTOMS   Pain in the knee, specifically the outer (lateral) and back (posterior) portions.  Pain that worsens with use of the popliteus muscle (standing on a slightly bent knee or rotating the knee).  A crackling sound (crepitation) when the tendon is moved or touched (uncommon, except when tested just after exercising). CAUSES Popliteus tendonitis occurs when damage to the popliteus tendon elicits an inflammatory (healing) response. Popliteus tendonitis is often an overuse injury. RISK INCREASES WITH:  Activities that require extensive running or walking downhill.  Poor strength and flexibility.  Failure to warm-up properly before activity.  Flat  feet. PREVENTION  Warm up and stretch properly before activity.  Allow for adequate recovery between workouts.  Maintain physical fitness:  Strength, flexibility, and endurance.  Cardiovascular fitness.  Learn and implement proper training regimens and sports technique.  Arch supports (orthotics) for individuals with flat feet. PROGNOSIS  If treated properly, then the symptoms of popliteus tendonitis usually resolve within 6 weeks.  RELATED COMPLICATIONS   Prolonged healing time, if improperly treated or re-injured.  Recurrent symptoms that result in a chronic problem. TREATMENT  Treatment initially involves the use of ice and medication to help reduce pain and inflammation. The use of strengthening and stretching exercises may help reduce pain with activity. These exercises may be performed at home or with referral to a therapist. Many individuals find that the use of a compression bandage or a knee sleeve helps reduce symptoms. If you have flat feet, then your caregiver may recommend arch supports. It is important to learn/modify techniques for running uphill/downhill that do not aggravate your symptoms. If symptoms persist for greater than 6 months despite conservative (non-surgical) treatment, then surgery may be recommended to remove the tendon sheath (lining).  MEDICATION   If pain medication is necessary, then nonsteroidal anti-inflammatory medications, such as aspirin and ibuprofen, or other minor pain relievers, such as acetaminophen, are often recommended.  Do not take pain medication within 7 days before surgery.  Prescription pain relievers may be given if deemed necessary by your caregiver. Use only as directed and only as much as you need. HEAT AND COLD  Cold treatment (icing) relieves pain and reduces inflammation. Cold treatment should be applied  for 10 to 15 minutes every 2 to 3 hours for inflammation and pain and immediately after any activity that aggravates your  symptoms. Use ice packs or massage the area with a piece of ice (ice massage).  Heat treatment may be used prior to performing the stretching and strengthening activities prescribed by your caregiver, physical therapist, or athletic trainer. Use a heat pack or soak the injury in warm water. SEEK MEDICAL CARE IF:  Treatment seems to offer no benefit, or the condition worsens.  Any medications produce adverse side effects. EXERCISES RANGE OF MOTION (ROM) AND STRETCHING EXERCISES - Popliteus Tendinitis These exercises may help you when beginning to rehabilitate your injury. Your symptoms may resolve with or without further involvement from your physician, physical therapist or athletic trainer. While completing these exercises, remember:   Restoring tissue flexibility helps normal motion to return to the joints. This allows healthier, less painful movement and activity.  An effective stretch should be held for at least 30 seconds.  A stretch should never be painful. You should only feel a gentle lengthening or release in the stretched tissue. STRETCH - Gastroc, Standing   Place hands on wall.  Extend right / left leg, keeping the front knee somewhat bent.  Slightly point your toes inward on your back foot.  Keeping your right / left heel on the floor and your knee straight, shift your weight toward the wall, not allowing your back to arch.  You should feel a gentle stretch in the right / left calf. Hold this position for __________ seconds. Repeat __________ times. Complete this stretch __________ times per day. STRETCH - Soleus, Standing   Place hands on wall.  Extend right / left leg, keeping the other knee somewhat bent.  Slightly point your toes inward on your back foot.  Keep your right / left heel on the floor, bend your back knee, and slightly shift your weight over the back leg so that you feel a gentle stretch deep in your back calf.  Hold this position for __________  seconds. Repeat __________ times. Complete this stretch __________ times per day. STRETCH - Gastrocsoleus, Standing  Note: This exercise can place a lot of stress on your foot and ankle. Please complete this exercise only if specifically instructed by your caregiver.   Place the ball of your right / left foot on a step, keeping your other foot firmly on the same step.  Hold on to the wall or a rail for balance.  Slowly lift your other foot, allowing your body weight to press your heel down over the edge of the step.  You should feel a stretch in your right / left calf.  Hold this position for __________ seconds.  Repeat this exercise with a slight bend in your right / left knee. Repeat __________ times. Complete this stretch __________ times per day.  STRETCH - Hamstrings, Standing  Stand or sit and extend your right / left leg, placing your foot on a chair or foot stool  Keeping a slight arch in your low back and your hips straight forward.  Lead with your chest and lean forward at the waist until you feel a gentle stretch in the back of your right / left knee or thigh. (When done correctly, this exercise requires leaning only a small distance.)  Hold this position for __________ seconds. Repeat __________ times. Complete this stretch __________ times per day. STRETCH - Hamstrings, Supine   Lie on your back. Loop a belt or  towel over the ball of your right / left foot.  Straighten your right / left knee and slowly pull on the belt to raise your leg. Do not allow the right / left knee to bend. Keep your opposite leg flat on the floor.  Raise the leg until you feel a gentle stretch behind your right / left knee or thigh. Hold this position for __________ seconds. Repeat __________ times. Complete this stretch __________ times per day.  STRETCH - Hamstrings, Doorway  Lie on your back with your right / left leg extended and resting on the wall and the opposite leg flat on the  ground through the door. Initially, position your bottom farther away from the wall than the illustration shows.  Keep your right / left knee straight. If you feel a stretch behind your knee or thigh, hold this position for __________ seconds.  If you do not feel a stretch, scoot your bottom closer to the door, and hold __________ seconds. Repeat __________ times. Complete this stretch __________ times per day.  STRETCH - Quadriceps, Prone   Lie on your stomach on a firm surface, such as a bed or padded floor.  Bend your right / left knee and grasp your ankle. If you are unable to reach, your ankle or pant leg, use a belt around your foot to lengthen your reach.  Gently pull your heel toward your buttocks. Your knee should not slide out to the side. You should feel a stretch in the front of your thigh and/or knee.  Hold this position for __________ seconds. Repeat __________ times. Complete this stretch __________ times per day.  STRENGTHENING EXERCISES - Popliteus Tendinitis These exercises may help you when beginning to rehabilitate your injury. They may resolve your symptoms with or without further involvement from your physician, physical therapist or athletic trainer. While completing these exercises, remember:   Muscles can gain both the endurance and the strength needed for everyday activities through controlled exercises.  Complete these exercises as instructed by your physician, physical therapist or athletic trainer. Progress the resistance and repetitions only as guided. STRENGTH - Hamstring, Isometrics   Lie on your back on a firm surface.  Bend your right / left knee approximately __________ degrees.  Dig your heel into the surface as if you are trying to pull it toward your buttocks. Tighten the muscles in the back of your thighs to "dig" as hard as you can without increasing any pain.  Hold this position for __________ seconds.  Release the tension gradually and allow  your muscle to completely relax for __________ seconds in between each exercise. Repeat __________ times. Complete this exercise __________ times per day.  STRENGTH - Hamstring, Curls   Lay on your stomach with your legs extended. (If you lay on a bed, your feet may hang over the edge.)  Tighten the muscles in the back of your thigh to bend your right / left knee up to 90 degrees. Keep your hips flat on the bed/floor.  Hold this position for __________ seconds.  Slowly lower your leg back to the starting position. Repeat __________ times. Complete this exercise __________ times per day.  OPTIONAL ANKLE WEIGHTS: Begin with ____________________, but DO NOT exceed ____________________. Increase in1 lb/0.5 kg increments.   This information is not intended to replace advice given to you by your health care provider. Make sure you discuss any questions you have with your health care provider.   Document Released: 10/04/2005 Document Revised: 02/18/2015 Document Reviewed:  01/16/2009 Elsevier Interactive Patient Education [employer].

## 2015-12-30 LAB — CYCLIC CITRUL PEPTIDE ANTIBODY, IGG

## 2015-12-30 LAB — ANA: Anti Nuclear Antibody(ANA): POSITIVE — AB

## 2015-12-30 LAB — ANTI-NUCLEAR AB-TITER (ANA TITER): ANA Titer 1: 1:160 {titer} — ABNORMAL HIGH

## 2015-12-30 LAB — ANTI-DNA ANTIBODY, DOUBLE-STRANDED

## 2015-12-30 LAB — SJOGRENS SYNDROME-B EXTRACTABLE NUCLEAR ANTIBODY: SSB (La) (ENA) Antibody, IgG: <1

## 2015-12-30 LAB — SJOGRENS SYNDROME-A EXTRACTABLE NUCLEAR ANTIBODY: SSA (Ro) (ENA) Antibody, IgG: <1

## 2015-12-31 ENCOUNTER — Telehealth: Payer: Self-pay | Admitting: Family Medicine

## 2015-12-31 NOTE — Telephone Encounter (Signed)
Chart updated to reflect 

## 2015-12-31 NOTE — Telephone Encounter (Signed)
Patient received flu shot October 2016 thru employer [employer]

## 2016-01-09 ENCOUNTER — Telehealth: Payer: Self-pay | Admitting: Family Medicine

## 2016-01-09 NOTE — Telephone Encounter (Signed)
Pt request lab result that was done 12/29/15 and she also wants to report that sample that Dr. Tamala Julian give her works but she still has symptoms. Please advise.

## 2016-01-12 ENCOUNTER — Other Ambulatory Visit: Payer: Self-pay

## 2016-01-12 DIAGNOSIS — M25562 Pain in left knee: Secondary | ICD-10-CM

## 2016-01-12 MED ORDER — IBUPROFEN-FAMOTIDINE 800-26.6 MG PO TABS: ORAL_TABLET | ORAL | Status: DC

## 2016-01-12 NOTE — Telephone Encounter (Signed)
Sent info in my chart +ANA- could be false positive but would like rheumatology to see her to make sure we are not missing anything If she would like I would try Dr. Amil Amen.  Also send in Duexis if she would like Thank you

## 2016-01-12 NOTE — Telephone Encounter (Signed)
Spoke with patient. Sending in referrral to Dr. Amil Amen. Sending in Duexis.

## 2016-02-02 ENCOUNTER — Ambulatory Visit: Payer: Self-pay | Admitting: Family Medicine

## 2016-02-17 ENCOUNTER — Telehealth: Payer: Self-pay | Admitting: Family Medicine

## 2016-02-17 NOTE — Telephone Encounter (Signed)
[employer] or [employer]

## 2016-02-17 NOTE — Telephone Encounter (Signed)
Is requesting referral to hand specialist to find out options on surgery for carpal tunnel.

## 2016-02-19 ENCOUNTER — Other Ambulatory Visit: Payer: Self-pay

## 2016-02-19 DIAGNOSIS — M25531 Pain in right wrist: Secondary | ICD-10-CM

## 2016-02-19 DIAGNOSIS — M25532 Pain in left wrist: Principal | ICD-10-CM

## 2016-02-19 NOTE — Telephone Encounter (Signed)
Orders placed.

## 2016-03-25 ENCOUNTER — Telehealth: Payer: Self-pay | Admitting: Internal Medicine

## 2016-03-25 NOTE — Telephone Encounter (Signed)
FYI for CY-pt has been doing well with out Adderall and will contact us if needed again in the future . Nothing more needed at this time.

## 2016-03-26 NOTE — Telephone Encounter (Signed)
Noted  

## 2016-05-19 ENCOUNTER — Other Ambulatory Visit: Payer: Self-pay | Admitting: Orthopedic Surgery

## 2016-05-20 ENCOUNTER — Ambulatory Visit: Payer: Self-pay | Admitting: Family Medicine

## 2016-05-21 ENCOUNTER — Encounter: Payer: Self-pay | Admitting: Internal Medicine

## 2016-05-28 ENCOUNTER — Other Ambulatory Visit (INDEPENDENT_AMBULATORY_CARE_PROVIDER_SITE_OTHER): Payer: 59

## 2016-05-28 ENCOUNTER — Encounter: Payer: Self-pay | Admitting: Internal Medicine

## 2016-05-28 ENCOUNTER — Ambulatory Visit (INDEPENDENT_AMBULATORY_CARE_PROVIDER_SITE_OTHER): Payer: 59 | Admitting: Internal Medicine

## 2016-05-28 VITALS — BP 136/82 | HR 72 | Temp 98.4°F | Resp 14 | Ht 66.0 in | Wt 170.8 lb

## 2016-05-28 DIAGNOSIS — Z0000 Encounter for general adult medical examination without abnormal findings: Secondary | ICD-10-CM

## 2016-05-28 LAB — CBC
HCT: 43.9 % (ref 36.0–46.0)
HEMOGLOBIN: 14.6 g/dL (ref 12.0–15.0)
MCHC: 33.4 g/dL (ref 30.0–36.0)
MCV: 90.8 fl (ref 78.0–100.0)
PLATELETS: 289 10*3/uL (ref 150.0–400.0)
RBC: 4.84 Mil/uL (ref 3.87–5.11)
RDW: 13.7 % (ref 11.5–15.5)
WBC: 5 10*3/uL (ref 4.0–10.5)

## 2016-05-28 LAB — LIPID PANEL
CHOL/HDL RATIO: 3
Cholesterol: 290 mg/dL — ABNORMAL HIGH (ref 0–200)
HDL: 113.4 mg/dL (ref >39.00)
LDL Cholesterol: 167 mg/dL — ABNORMAL HIGH (ref 0–99)
NONHDL: 176.71
Triglycerides: 51 mg/dL (ref 0.0–149.0)
VLDL: 10.2 mg/dL (ref 0.0–40.0)

## 2016-05-28 LAB — COMPREHENSIVE METABOLIC PANEL
ALK PHOS: 72 U/L (ref 39–117)
ALT: 17 U/L (ref 0–35)
AST: 17 U/L (ref 0–37)
Albumin: 4.4 g/dL (ref 3.5–5.2)
BILIRUBIN TOTAL: 0.8 mg/dL (ref 0.2–1.2)
BUN: 15 mg/dL (ref 6–23)
CALCIUM: 9.8 mg/dL (ref 8.4–10.5)
CO2: 28 meq/L (ref 19–32)
CREATININE: 0.79 mg/dL (ref 0.40–1.20)
Chloride: 103 mEq/L (ref 96–112)
GFR: 81.34 mL/min (ref >60.00)
GLUCOSE: 92 mg/dL (ref 70–99)
Potassium: 4.3 mEq/L (ref 3.5–5.1)
SODIUM: 138 meq/L (ref 135–145)
Total Protein: 7.6 g/dL (ref 6.0–8.3)

## 2016-05-28 NOTE — Progress Notes (Signed)
Pre visit review using our clinic review tool, if applicable. No additional management support is needed unless otherwise documented below in the visit note. 

## 2016-05-28 NOTE — Progress Notes (Signed)
   Subjective:    Patient ID: Jillian Mcknight, female    DOB: February 13, 1966, 50 y.o.   MRN: [id number]  HPI The patient is a 50 YO female coming in for wellness and to switch providers as her doctor is changing offices.   PMH, [facility], social history reviewed and updated.   Review of Systems  Constitutional: Negative for activity change, appetite change, fatigue and fever.  HENT: Negative.   Eyes: Negative.   Respiratory: Negative for cough, chest tightness and shortness of breath.   Cardiovascular: Negative for chest pain, palpitations and leg swelling.  Gastrointestinal: Negative for abdominal distention, abdominal pain, constipation, diarrhea and nausea.  Musculoskeletal: Negative.   Skin: Negative.   Neurological: Negative.   Psychiatric/Behavioral: Negative.       Objective:   Physical Exam  Constitutional: She is oriented to person, place, and time. She appears well-developed and well-nourished.  HENT:  Head: Normocephalic and atraumatic.  Eyes: EOM are normal.  Neck: Normal range of motion.  Cardiovascular: Normal rate and regular rhythm.   Pulmonary/Chest: Effort normal and breath sounds normal. No respiratory distress. She has no wheezes. She has no rales.  Abdominal: Soft. Bowel sounds are normal. She exhibits no distension. There is no tenderness. There is no rebound.  Musculoskeletal: She exhibits no edema.  Neurological: She is alert and oriented to person, place, and time. Coordination normal.  Skin: Skin is warm and dry.  Psychiatric: She has a normal mood and affect.   Vitals:   05/28/16 0806  BP: 136/82  Pulse: 72  Resp: 14  Temp: 98.4 F (36.9 C)  TempSrc: Oral  SpO2: 98%  Weight: 170 lb 12.8 oz (77.5 kg)  Height: 5\' 6"  (1.676 m)      Assessment & Plan:

## 2016-05-28 NOTE — Assessment & Plan Note (Signed)
Mammogram up to date and colonoscopy due in 2027. Reminded about yearly flu shot. Counseled about sun safety and mole surveillance as well as the dangers of distracted driving. Given screening recommendations.

## 2016-05-28 NOTE — Patient Instructions (Signed)
We will check the labs today and call you back with the results.   Work on getting back to exercising several times per week and eating more at home.  Health Maintenance, Female Adopting a healthy lifestyle and getting preventive care can go a long way to promote health and wellness. Talk with your health care provider about what schedule of regular examinations is right for you. This is a good chance for you to check in with your provider about disease prevention and staying healthy. In between checkups, there are plenty of things you can do on your own. Experts have done a lot of research about which lifestyle changes and preventive measures are most likely to keep you healthy. Ask your health care provider for more information. WEIGHT AND DIET  Eat a healthy diet  Be sure to include plenty of vegetables, fruits, low-fat dairy products, and lean protein.  Do not eat a lot of foods high in solid fats, added sugars, or salt.  Get regular exercise. This is one of the most important things you can do for your health.  Most adults should exercise for at least 150 minutes each week. The exercise should increase your heart rate and make you sweat (moderate-intensity exercise).  Most adults should also do strengthening exercises at least twice a week. This is in addition to the moderate-intensity exercise.  Maintain a healthy weight  Body mass index (BMI) is a measurement that can be used to identify possible weight problems. It estimates body fat based on height and weight. Your health care provider can help determine your BMI and help you achieve or maintain a healthy weight.  For females 52 years of age and older:   A BMI below 18.5 is considered underweight.  A BMI of 18.5 to 24.9 is normal.  A BMI of 25 to 29.9 is considered overweight.  A BMI of 30 and above is considered obese.  Watch levels of cholesterol and blood lipids  You should start having your blood tested for lipids  and cholesterol at 52 years of age, then have this test every 5 years.  You may need to have your cholesterol levels checked more often if:  Your lipid or cholesterol levels are high.  You are older than 52 years of age.  You are at high risk for heart disease.  CANCER SCREENING   Lung Cancer  Lung cancer screening is recommended for adults 7-51 years old who are at high risk for lung cancer because of a history of smoking.  A yearly low-dose CT scan of the lungs is recommended for people who:  Currently smoke.  Have quit within the past 15 years.  Have at least a 30-pack-year history of smoking. A pack year is smoking an average of one pack of cigarettes a day for 1 year.  Yearly screening should continue until it has been 15 years since you quit.  Yearly screening should stop if you develop a health problem that would prevent you from having lung cancer treatment.  Breast Cancer  Practice breast self-awareness. This means understanding how your breasts normally appear and feel.  It also means doing regular breast self-exams. Let your health care provider know about any changes, no matter how small.  If you are in your 20s or 30s, you should have a clinical breast exam (CBE) by a health care provider every 1-3 years as part of a regular health exam.  If you are 49 or older, have a CBE every year.  Also consider having a breast X-ray (mammogram) every year.  If you have a family history of breast cancer, talk to your health care provider about genetic screening.  If you are at high risk for breast cancer, talk to your health care provider about having an MRI and a mammogram every year.  Breast cancer gene (BRCA) assessment is recommended for women who have family members with BRCA-related cancers. BRCA-related cancers include:  Breast.  Ovarian.  Tubal.  Peritoneal cancers.  Results of the assessment will determine the need for genetic counseling and BRCA1 and  BRCA2 testing. Cervical Cancer Your health care provider may recommend that you be screened regularly for cancer of the pelvic organs (ovaries, uterus, and vagina). This screening involves a pelvic examination, including checking for microscopic changes to the surface of your cervix (Pap test). You may be encouraged to have this screening done every 3 years, beginning at age 67.  For women ages 48-65, health care providers may recommend pelvic exams and Pap testing every 3 years, or they may recommend the Pap and pelvic exam, combined with testing for human papilloma virus (HPV), every 5 years. Some types of HPV increase your risk of cervical cancer. Testing for HPV may also be done on women of any age with unclear Pap test results.  Other health care providers may not recommend any screening for nonpregnant women who are considered low risk for pelvic cancer and who do not have symptoms. Ask your health care provider if a screening pelvic exam is right for you.  If you have had past treatment for cervical cancer or a condition that could lead to cancer, you need Pap tests and screening for cancer for at least 20 years after your treatment. If Pap tests have been discontinued, your risk factors (such as having a new sexual partner) need to be reassessed to determine if screening should resume. Some women have medical problems that increase the chance of getting cervical cancer. In these cases, your health care provider may recommend more frequent screening and Pap tests. Colorectal Cancer  This type of cancer can be detected and often prevented.  Routine colorectal cancer screening usually begins at 52 years of age and continues through 52 years of age.  Your health care provider may recommend screening at an earlier age if you have risk factors for colon cancer.  Your health care provider may also recommend using home test kits to check for hidden blood in the stool.  A small camera at the end of  a tube can be used to examine your colon directly (sigmoidoscopy or colonoscopy). This is done to check for the earliest forms of colorectal cancer.  Routine screening usually begins at age 71.  Direct examination of the colon should be repeated every 5-10 years through 52 years of age. However, you may need to be screened more often if early forms of precancerous polyps or small growths are found. Skin Cancer  Check your skin from head to toe regularly.  Tell your health care provider about any new moles or changes in moles, especially if there is a change in a mole's shape or color.  Also tell your health care provider if you have a mole that is larger than the size of a pencil eraser.  Always use sunscreen. Apply sunscreen liberally and repeatedly throughout the day.  Protect yourself by wearing long sleeves, pants, a wide-brimmed hat, and sunglasses whenever you are outside. HEART DISEASE, DIABETES, AND HIGH BLOOD PRESSURE  High blood pressure causes heart disease and increases the risk of stroke. High blood pressure is more likely to develop in:  People who have blood pressure in the high end of the normal range (130-139/85-89 mm Hg).  People who are overweight or obese.  People who are African American.  If you are 18-39 years of age, have your blood pressure checked every 3-5 years. If you are 40 years of age or older, have your blood pressure checked every year. You should have your blood pressure measured twice--once when you are at a hospital or clinic, and once when you are not at a hospital or clinic. Record the average of the two measurements. To check your blood pressure when you are not at a hospital or clinic, you can use:  An automated blood pressure machine at a pharmacy.  A home blood pressure monitor.  If you are between 55 years and 79 years old, ask your health care provider if you should take aspirin to prevent strokes.  Have regular diabetes screenings. This  involves taking a blood sample to check your fasting blood sugar level.  If you are at a normal weight and have a low risk for diabetes, have this test once every three years after 52 years of age.  If you are overweight and have a high risk for diabetes, consider being tested at a younger age or more often. PREVENTING INFECTION  Hepatitis B  If you have a higher risk for hepatitis B, you should be screened for this virus. You are considered at high risk for hepatitis B if:  You were born in a country where hepatitis B is common. Ask your health care provider which countries are considered high risk.  Your parents were born in a high-risk country, and you have not been immunized against hepatitis B (hepatitis B vaccine).  You have HIV or AIDS.  You use needles to inject street drugs.  You live with someone who has hepatitis B.  You have had sex with someone who has hepatitis B.  You get hemodialysis treatment.  You take certain medicines for conditions, including cancer, organ transplantation, and autoimmune conditions. Hepatitis C  Blood testing is recommended for:  Everyone born from 1945 through 1965.  Anyone with known risk factors for hepatitis C. Sexually transmitted infections (STIs)  You should be screened for sexually transmitted infections (STIs) including gonorrhea and chlamydia if:  You are sexually active and are younger than 52 years of age.  You are older than 52 years of age and your health care provider tells you that you are at risk for this type of infection.  Your sexual activity has changed since you were last screened and you are at an increased risk for chlamydia or gonorrhea. Ask your health care provider if you are at risk.  If you do not have HIV, but are at risk, it may be recommended that you take a prescription medicine daily to prevent HIV infection. This is called pre-exposure prophylaxis (PrEP). You are considered at risk if:  You are  sexually active and do not regularly use condoms or know the HIV status of your partner(s).  You take drugs by injection.  You are sexually active with a partner who has HIV. Talk with your health care provider about whether you are at high risk of being infected with HIV. If you choose to begin PrEP, you should first be tested for HIV. You should then be tested every 3 months for as   long as you are taking PrEP.  PREGNANCY   If you are premenopausal and you may become pregnant, ask your health care provider about preconception counseling.  If you may become pregnant, take 400 to 800 micrograms (mcg) of folic acid every day.  If you want to prevent pregnancy, talk to your health care provider about birth control (contraception). OSTEOPOROSIS AND MENOPAUSE   Osteoporosis is a disease in which the bones lose minerals and strength with aging. This can result in serious bone fractures. Your risk for osteoporosis can be identified using a bone density scan.  If you are 27 years of age or older, or if you are at risk for osteoporosis and fractures, ask your health care provider if you should be screened.  Ask your health care provider whether you should take a calcium or vitamin D supplement to lower your risk for osteoporosis.  Menopause may have certain physical symptoms and risks.  Hormone replacement therapy may reduce some of these symptoms and risks. Talk to your health care provider about whether hormone replacement therapy is right for you.  HOME CARE INSTRUCTIONS   Schedule regular health, dental, and eye exams.  Stay current with your immunizations.   Do not use any tobacco products including cigarettes, chewing tobacco, or electronic cigarettes.  If you are pregnant, do not drink alcohol.  If you are breastfeeding, limit how much and how often you drink alcohol.  Limit alcohol intake to no more than 1 drink per day for nonpregnant women. One drink equals 12 ounces of beer, 5  ounces of wine, or 1 ounces of hard liquor.  Do not use street drugs.  Do not share needles.  Ask your health care provider for help if you need support or information about quitting drugs.  Tell your health care provider if you often feel depressed.  Tell your health care provider if you have ever been abused or do not feel safe at home.   This information is not intended to replace advice given to you by your health care provider. Make sure you discuss any questions you have with your health care provider.   Document Released: 04/19/2011 Document Revised: 10/25/2014 Document Reviewed: 09/05/2013 Elsevier Interactive Patient Education [employer].

## 2016-07-06 ENCOUNTER — Other Ambulatory Visit: Payer: Self-pay | Admitting: Family Medicine

## 2016-08-04 ENCOUNTER — Encounter (HOSPITAL_BASED_OUTPATIENT_CLINIC_OR_DEPARTMENT_OTHER): Payer: Self-pay | Admitting: *Deleted

## 2016-08-05 ENCOUNTER — Encounter: Payer: Self-pay | Admitting: Geriatric Medicine

## 2016-08-10 ENCOUNTER — Encounter (HOSPITAL_BASED_OUTPATIENT_CLINIC_OR_DEPARTMENT_OTHER): Payer: Self-pay | Admitting: Certified Registered"

## 2016-08-10 ENCOUNTER — Encounter (HOSPITAL_BASED_OUTPATIENT_CLINIC_OR_DEPARTMENT_OTHER)
Admission: RE | Disposition: A | Discharge status: Home / Self Care | Payer: Self-pay | Source: Ambulatory Visit | Attending: Orthopedic Surgery

## 2016-08-10 ENCOUNTER — Ambulatory Visit (HOSPITAL_BASED_OUTPATIENT_CLINIC_OR_DEPARTMENT_OTHER): Payer: 59 | Admitting: Anesthesiology

## 2016-08-10 ENCOUNTER — Ambulatory Visit (HOSPITAL_BASED_OUTPATIENT_CLINIC_OR_DEPARTMENT_OTHER)
Admission: RE | Admit: 2016-08-10 | Discharge: 2016-08-10 | Disposition: A | Discharge status: Home / Self Care | Payer: 59 | Source: Ambulatory Visit | Attending: Orthopedic Surgery | Admitting: Orthopedic Surgery

## 2016-08-10 DIAGNOSIS — G5601 Carpal tunnel syndrome, right upper limb: Secondary | ICD-10-CM | POA: Insufficient documentation

## 2016-08-10 DIAGNOSIS — Z79899 Other long term (current) drug therapy: Secondary | ICD-10-CM | POA: Diagnosis not present

## 2016-08-10 DIAGNOSIS — M1990 Unspecified osteoarthritis, unspecified site: Secondary | ICD-10-CM | POA: Diagnosis not present

## 2016-08-10 DIAGNOSIS — F329 Major depressive disorder, single episode, unspecified: Secondary | ICD-10-CM | POA: Diagnosis not present

## 2016-08-10 HISTORY — DX: Unspecified osteoarthritis, unspecified site: M19.90

## 2016-08-10 HISTORY — PX: CARPAL TUNNEL RELEASE: SHX101

## 2016-08-10 HISTORY — DX: Carpal tunnel syndrome, right upper limb: G56.01

## 2016-08-10 SURGERY — CARPAL TUNNEL RELEASE
Anesthesia: Regional | Site: Wrist | Laterality: Right

## 2016-08-10 MED ORDER — CEFAZOLIN SODIUM-DEXTROSE 2-4 GM/100ML-% IV SOLN
2.0000 g | INTRAVENOUS | Status: AC
  Administered 2016-08-10: 2 g via INTRAVENOUS

## 2016-08-10 MED ORDER — ONDANSETRON HCL 4 MG/2ML IJ SOLN
INTRAMUSCULAR | Status: AC
  Filled 2016-08-10: qty 2

## 2016-08-10 MED ORDER — SCOPOLAMINE 1 MG/3DAYS TD PT72: 1.0000 | MEDICATED_PATCH | Freq: Once | TRANSDERMAL | Status: DC | PRN

## 2016-08-10 MED ORDER — LACTATED RINGERS IV SOLN: INTRAVENOUS | Status: DC

## 2016-08-10 MED ORDER — MIDAZOLAM HCL 2 MG/2ML IJ SOLN
1.0000 mg | INTRAMUSCULAR | Status: DC | PRN
  Administered 2016-08-10 (×2): 1 mg via INTRAVENOUS

## 2016-08-10 MED ORDER — CHLORHEXIDINE GLUCONATE 4 % EX LIQD: 60.0000 mL | Freq: Once | CUTANEOUS | Status: DC

## 2016-08-10 MED ORDER — MIDAZOLAM HCL 2 MG/2ML IJ SOLN
INTRAMUSCULAR | Status: AC
  Filled 2016-08-10: qty 2

## 2016-08-10 MED ORDER — OXYCODONE HCL 5 MG PO TABS
ORAL_TABLET | ORAL | Status: AC
  Filled 2016-08-10: qty 1

## 2016-08-10 MED ORDER — FENTANYL CITRATE (PF) 100 MCG/2ML IJ SOLN
50.0000 ug | INTRAMUSCULAR | Status: DC | PRN
  Administered 2016-08-10: 50 ug via INTRAVENOUS

## 2016-08-10 MED ORDER — ONDANSETRON HCL 4 MG/2ML IJ SOLN
INTRAMUSCULAR | Status: DC | PRN
  Administered 2016-08-10: 4 mg via INTRAVENOUS

## 2016-08-10 MED ORDER — GLYCOPYRROLATE 0.2 MG/ML IJ SOLN: 0.2000 mg | Freq: Once | INTRAMUSCULAR | Status: DC | PRN

## 2016-08-10 MED ORDER — OXYCODONE HCL 5 MG PO TABS
5.0000 mg | ORAL_TABLET | Freq: Once | ORAL | Status: AC
  Administered 2016-08-10: 5 mg via ORAL

## 2016-08-10 MED ORDER — CEFAZOLIN SODIUM-DEXTROSE 2-4 GM/100ML-% IV SOLN
INTRAVENOUS | Status: AC
  Filled 2016-08-10: qty 100

## 2016-08-10 MED ORDER — HYDROCODONE-ACETAMINOPHEN 5-325 MG PO TABS: 1.0000 | ORAL_TABLET | Freq: Four times a day (QID) | ORAL | 0 refills | Status: DC | PRN

## 2016-08-10 MED ORDER — BUPIVACAINE HCL (PF) 0.25 % IJ SOLN
INTRAMUSCULAR | Status: DC | PRN
  Administered 2016-08-10: 9 mL

## 2016-08-10 MED ORDER — FENTANYL CITRATE (PF) 100 MCG/2ML IJ SOLN
INTRAMUSCULAR | Status: AC
  Filled 2016-08-10: qty 2

## 2016-08-10 MED ORDER — METOCLOPRAMIDE HCL 5 MG/ML IJ SOLN: 10.0000 mg | Freq: Once | INTRAMUSCULAR | Status: DC | PRN

## 2016-08-10 MED ORDER — LIDOCAINE HCL (PF) 0.5 % IJ SOLN
INTRAMUSCULAR | Status: DC | PRN
  Administered 2016-08-10: 35 mL via INTRAVENOUS

## 2016-08-10 MED ORDER — LACTATED RINGERS IV SOLN
INTRAVENOUS | Status: DC
  Administered 2016-08-10: 09:00:00 via INTRAVENOUS

## 2016-08-10 MED ORDER — MEPERIDINE HCL 25 MG/ML IJ SOLN: 6.2500 mg | INTRAMUSCULAR | Status: DC | PRN

## 2016-08-10 MED ORDER — PROPOFOL 500 MG/50ML IV EMUL
INTRAVENOUS | Status: DC | PRN
  Administered 2016-08-10: 75 ug/kg/min via INTRAVENOUS

## 2016-08-10 MED ORDER — LIDOCAINE 2% (20 MG/ML) 5 ML SYRINGE
INTRAMUSCULAR | Status: AC
  Filled 2016-08-10: qty 5

## 2016-08-10 MED ORDER — FENTANYL CITRATE (PF) 100 MCG/2ML IJ SOLN: 25.0000 ug | INTRAMUSCULAR | Status: DC | PRN

## 2016-08-10 SURGICAL SUPPLY — 36 items
BLADE SURG 15 STRL LF DISP TIS (BLADE) ×1 IMPLANT
BLADE SURG 15 STRL SS (BLADE) ×3
BNDG CMPR 9X4 STRL LF SNTH (GAUZE/BANDAGES/DRESSINGS) ×1
BNDG COHESIVE 3X5 TAN STRL LF (GAUZE/BANDAGES/DRESSINGS) ×3 IMPLANT
BNDG ESMARK 4X9 LF (GAUZE/BANDAGES/DRESSINGS) ×2 IMPLANT
BNDG GAUZE ELAST 4 BULKY (GAUZE/BANDAGES/DRESSINGS) ×3 IMPLANT
CHLORAPREP W/TINT 26ML (MISCELLANEOUS) ×3 IMPLANT
CORDS BIPOLAR (ELECTRODE) ×3 IMPLANT
COVER BACK TABLE 60X90IN (DRAPES) ×3 IMPLANT
COVER MAYO STAND STRL (DRAPES) ×3 IMPLANT
CUFF TOURNIQUET SINGLE 18IN (TOURNIQUET CUFF) ×3 IMPLANT
DRAPE EXTREMITY T 121X128X90 (DRAPE) ×3 IMPLANT
DRAPE SURG 17X23 STRL (DRAPES) ×3 IMPLANT
DRSG PAD ABDOMINAL 8X10 ST (GAUZE/BANDAGES/DRESSINGS) ×3 IMPLANT
GAUZE SPONGE 4X4 12PLY STRL (GAUZE/BANDAGES/DRESSINGS) ×3 IMPLANT
GAUZE XEROFORM 1X8 LF (GAUZE/BANDAGES/DRESSINGS) ×3 IMPLANT
GLOVE BIOGEL PI IND STRL 7.0 (GLOVE) IMPLANT
GLOVE BIOGEL PI IND STRL 8.5 (GLOVE) ×1 IMPLANT
GLOVE BIOGEL PI INDICATOR 7.0 (GLOVE) ×4
GLOVE BIOGEL PI INDICATOR 8.5 (GLOVE) ×2
GLOVE ECLIPSE 6.5 STRL STRAW (GLOVE) ×2 IMPLANT
GLOVE SURG ORTHO 8.0 STRL STRW (GLOVE) ×3 IMPLANT
GOWN STRL REUS W/ TWL LRG LVL3 (GOWN DISPOSABLE) ×1 IMPLANT
GOWN STRL REUS W/TWL LRG LVL3 (GOWN DISPOSABLE) ×3
GOWN STRL REUS W/TWL XL LVL3 (GOWN DISPOSABLE) ×3 IMPLANT
NDL PRECISIONGLIDE 27X1.5 (NEEDLE) IMPLANT
NEEDLE PRECISIONGLIDE 27X1.5 (NEEDLE) ×3 IMPLANT
NS IRRIG 1000ML POUR BTL (IV SOLUTION) ×3 IMPLANT
PACK BASIN DAY SURGERY FS (CUSTOM PROCEDURE TRAY) ×3 IMPLANT
STOCKINETTE 4X48 STRL (DRAPES) ×3 IMPLANT
SUT ETHILON 4 0 PS 2 18 (SUTURE) ×3 IMPLANT
SUT VICRYL 4-0 PS2 18IN ABS (SUTURE) IMPLANT
SYR BULB 3OZ (MISCELLANEOUS) ×3 IMPLANT
SYR CONTROL 10ML LL (SYRINGE) ×2 IMPLANT
TOWEL OR 17X24 6PK STRL BLUE (TOWEL DISPOSABLE) ×3 IMPLANT
UNDERPAD 30X30 (UNDERPADS AND DIAPERS) ×1 IMPLANT

## 2016-08-10 NOTE — Anesthesia Preprocedure Evaluation (Addendum)
Anesthesia Evaluation  Patient identified by MRN, date of birth, ID band Patient awake    Reviewed: Allergy & Precautions, NPO status , Patient's Chart, lab work & pertinent test results  Airway Mallampati: II  TM Distance: >3 FB Neck ROM: Full    Dental no notable dental hx.    Pulmonary neg pulmonary ROS,    Pulmonary exam normal breath sounds clear to auscultation       Cardiovascular negative cardio ROS Normal cardiovascular exam Rhythm:Regular Rate:Normal     Neuro/Psych  Headaches, PSYCHIATRIC DISORDERS Depression  Neuromuscular disease negative psych ROS   GI/Hepatic negative GI ROS, Neg liver ROS,   Endo/Other  negative endocrine ROS  Renal/GU negative Renal ROS  negative genitourinary   Musculoskeletal  (+) Arthritis ,   Abdominal   Peds negative pediatric ROS (+)  Hematology negative hematology ROS (+)   Anesthesia Other Findings   Reproductive/Obstetrics negative OB ROS                           Anesthesia Physical Anesthesia Plan  ASA: II  Anesthesia Plan: Bier Block   Post-op Pain Management:    Induction: Intravenous  Airway Management Planned: Natural Airway  Additional Equipment:   Intra-op Plan:   Post-operative Plan:   Informed Consent: I have reviewed the patients History and Physical, chart, labs and discussed the procedure including the risks, benefits and alternatives for the proposed anesthesia with the patient or authorized representative who has indicated his/her understanding and acceptance.   Dental advisory given  Plan Discussed with: CRNA  Anesthesia Plan Comments:         Anesthesia Quick Evaluation

## 2016-08-10 NOTE — H&P (Signed)
Jillian Mcknight is an 50 y.o. female.   Chief Complaint: numbness right hand HPI: Jillian Mcknight 50 year old right hand dominant female referred by Dr. Bea Graff consultation with respect to numbness, tingling in both hands, right greater than left, relatively constant on the right side. She states that she had a carpal tunnel when she was pregnant 17 years ago, it resolved, but has returned over the past year. She is awaken 5/7 nights. She complains of weakness a tight pain, especially to right hand with a VAS score of 5/10. She has begun dropping them. She states clubbing and using her hand increase it, ice have helped. She hast had exercises, Meloxicam, Duexis, which is Motrin to take which have given her minimal relief. She has had this injected on two occasions by Dr. Tamala Julian in June of last year and December of this year. She has worn a splint. She has a history of arthritis. No history of diabetes, thyroid problems or gout.She has had her nerve conductions done by Dr. Thereasa Parkin along with an ultrasound. This reveals a carpal tunnel syndrome on her right side and sonographic changes at borderline carpal tunnel syndrome on her left side with normal nerve studies.      FAMILY HISTORY: Negative for diabetes, thyroid problems, arthritis and gout. She has no other medical problems and a negative review of systems.      Past Medical History:  Diagnosis Date  . Allergy   . Arthritis    knees  . Carpal tunnel syndrome of right wrist   . Depression   . Headache, frequent episodic tension-type     Past Surgical History:  Procedure Laterality Date  . WISDOM TOOTH EXTRACTION  1982    Family History  Problem Relation Age of Onset  . Heart disease Mother   . Stroke Mother   . Lymphoma Mother   . Alzheimer's disease Father   . Cancer Cousin   . Colon cancer Cousin 39    colon  . Colon cancer Cousin 55    colon  . Diabetes Paternal Grandmother   . Colitis Daughter    Social History:   reports that she has never smoked. She has never used smokeless tobacco. She reports that she drinks alcohol. She reports that she does not use drugs.  Allergies:  Allergies  Allergen Reactions  . Sulfamethoxazole     Other reaction(s): GI Upset (intolerance)    No prescriptions prior to admission.    No results found for this or any previous visit (from the past 48 hour(s)).  No results found.   Pertinent items are noted in HPI.  Height 5\' 6"  (1.676 m), weight 77.1 kg (170 lb).  General appearance: alert, cooperative and appears stated age Head: Normocephalic, without obvious abnormality Neck: no JVD Resp: clear to auscultation bilaterally Cardio: regular rate and rhythm, S1, S2 normal, no murmur, click, rub or gallop GI: soft, non-tender; bowel sounds normal; no masses,  no organomegaly Extremities: numbness right hand Pulses: 2+ and symmetric Skin: Skin color, texture, turgor normal. No rashes or lesions Neurologic: Grossly normal Incision/Wound: na  Assessment/Plan Dx; CTS Rt  We have discussed the possibility of surgical decompression of the median nerve on her right side. Pre, peri and postoperative courses were discussed along with risks and complications. She is aware that there is no guarantee with the surgery, the possibility of infection, recurrence, injury to arteries, nerves, tendons, incomplete relief of symptoms and dystrophy. She would like to call us back to schedule this.  This can be scheduled as an outpatient under regional anesthesia for right carpal tunnel release.      Sade Hollon R 08/10/2016, 8:28 AM

## 2016-08-10 NOTE — Op Note (Signed)
Dictation Number [id number]

## 2016-08-10 NOTE — Discharge Instructions (Addendum)

## 2016-08-10 NOTE — Transfer of Care (Signed)
Immediate Anesthesia Transfer of Care Note  Patient: Jillian Mcknight  Procedure(s) Performed: Procedure(s) with comments: RIGHT CARPAL TUNNEL RELEASE (Right) - FAB  Patient Location: PACU  Anesthesia Type:Bier block  Level of Consciousness: awake, alert , oriented, sedated and patient cooperative  Airway & Oxygen Therapy: Patient Spontanous Breathing  Post-op Assessment: Report given to RN and Post -op Vital signs reviewed and stable  Post vital signs: Reviewed and stable  Last Vitals:  Vitals:   08/10/16 0903  BP: 110/65  Pulse: 78  Resp: 18  Temp: 36.8 C    Last Pain:  Vitals:   08/10/16 0903  TempSrc: Oral  PainSc: 5          Complications: No apparent anesthesia complications

## 2016-08-10 NOTE — Anesthesia Postprocedure Evaluation (Signed)
Anesthesia Post Note  Patient: Jillian Mcknight  Procedure(s) Performed: Procedure(s) (LRB): RIGHT CARPAL TUNNEL RELEASE (Right)  Patient location during evaluation: PACU Anesthesia Type: MAC and Regional Level of consciousness: awake and alert Pain management: pain level controlled Vital Signs Assessment: post-procedure vital signs reviewed and stable Respiratory status: spontaneous breathing, nonlabored ventilation, respiratory function stable and patient connected to nasal cannula oxygen Cardiovascular status: stable and blood pressure returned to baseline Anesthetic complications: no    Last Vitals:  Vitals:   08/10/16 1045 08/10/16 1114  BP: 119/80 136/72  Pulse: 63 77  Resp: 16 16  Temp:  36.4 C    Last Pain:  Vitals:   08/10/16 1114  TempSrc:   PainSc: 2                  Montez Hageman

## 2016-08-10 NOTE — Brief Op Note (Signed)
08/10/2016  10:31 AM  PATIENT:  Jillian Mcknight  50 y.o. female  PRE-OPERATIVE DIAGNOSIS:  RIGHT CARPAL TUNNEL SYNDROME  POST-OPERATIVE DIAGNOSIS:  RIGHT CARPAL TUNNEL SYNDROME  PROCEDURE:  Procedure(s) with comments: RIGHT CARPAL TUNNEL RELEASE (Right) - FAB  SURGEON:  Surgeon(s) and Role:    * Daryll Brod, MD - Primary  PHYSICIAN ASSISTANT:   ASSISTANTS: none   ANESTHESIA:   local and regional  EBL:  Total I/O In: 500 [I.V.:500] Out: -   BLOOD ADMINISTERED:none  DRAINS: none   LOCAL MEDICATIONS USED:  BUPIVICAINE   SPECIMEN:  No Specimen  DISPOSITION OF SPECIMEN:  N/A  COUNTS:  YES  TOURNIQUET:   Total Tourniquet Time Documented: Forearm (Right) - 21 minutes Total: Forearm (Right) - 21 minutes   DICTATION: .Other Dictation: Dictation Number [id number]  PLAN OF CARE: Discharge to home after PACU  PATIENT DISPOSITION:  PACU - hemodynamically stable.

## 2016-08-11 ENCOUNTER — Encounter (HOSPITAL_BASED_OUTPATIENT_CLINIC_OR_DEPARTMENT_OTHER): Payer: Self-pay | Admitting: Orthopedic Surgery

## 2016-08-11 NOTE — Op Note (Signed)
NAME:  Jillian Mcknight, Jillian Mcknight NO.:  [id number]  MEDICAL RECORD NO.:  [id number]  LOCATION:                                 FACILITY:  PHYSICIAN:  Daryll Brod, M.D.            DATE OF BIRTH:  DATE OF PROCEDURE:  08/10/2016 DATE OF DISCHARGE:                              OPERATIVE REPORT   PREOPERATIVE DIAGNOSIS:  Carpal tunnel syndrome, right hand.  POSTOPERATIVE DIAGNOSIS:  Carpal tunnel syndrome, right hand.  OPERATION:  Decompression, right median nerve.  SURGEON:  Daryll Brod, MD.  ANESTHESIA:  Forearm IV regional with local infiltration.  PLACE OF SURGERY:  [facility].  ANESTHESIOLOGIST:  Dr. Marcell Barlow.  HISTORY:  The patient is a 50 year old female with a history of carpal tunnel syndrome.  EMG nerve conduction is positive.  This has not responded to conservative treatment.  She has elected to undergo surgical release of the median nerve.  Pre, peri, and postoperative course have been discussed along with risks and complications.  She is aware that there is no guarantee to the surgery, possibility of infection; recurrence of injury to arteries, nerves, tendons; incomplete relief of symptoms with dystrophy. In the preoperative area, the patient was seen, the extremity marked by both patient and surgeon.  Antibiotic given.  PROCEDURE IN DETAIL:  The patient was brought to the operating room where a forearm-based IV regional anesthetic was carried out without difficulty under the direction of Anesthesia.  She was prepped using ChloraPrep in the supine position with the right arm free.  A 30-minute dry time was allowed and time-out taken confirming the patient and procedure.  A longitudinal incision was made in the right palm, carried down through subcutaneous tissue.  Bleeders were electrocauterized with bipolar.  The palmar fascia was split.  Superficial palmar arch was identified.  The flexor tendon of the ring and little finger was identified.   Retractors were placed protecting the median nerve radially and ulnar nerve ulnarly.  With sharp dissection, the flexor retinaculum was incised on its ulnar border.  A right angle and Sewall retractor were placed between skin and forearm fascia.  The median nerve was dissected free from beneath the fascia.  Blunt scissors were then used to cut the remainder of the flexor retinaculum in the distal forearm fascia for approximately 2 cm proximal to the wrist crease under direct vision.  The canal was explored.  An area compression to the median nerve was immediately apparent with an hourglass deformity.  Motor branch was noted to enter into muscle distally.  The wound was copiously irrigated with saline.  The skin was then closed with interrupted 4-0 nylon sutures.  A local infiltration with 0.25% bupivacaine without epinephrine was given, approximately 9 mL was used.  A sterile compressive dressing with the fingers free was applied.  On deflation of the tourniquet, all fingers immediately pinked.  She was taken to the recovery room for observation in satisfactory condition.  She will be discharged to home to return to [place] in 1 week, on Norco.          ______________________________ Daryll Brod, M.D.  GK/MEDQ  D:  08/10/2016  T:  08/11/2016  Job:  [id number]

## 2016-08-23 ENCOUNTER — Other Ambulatory Visit: Payer: Self-pay | Admitting: Internal Medicine

## 2016-08-23 DIAGNOSIS — Z1231 Encounter for screening mammogram for malignant neoplasm of breast: Secondary | ICD-10-CM

## 2016-09-02 ENCOUNTER — Ambulatory Visit: Payer: Self-pay

## 2016-10-08 ENCOUNTER — Encounter: Payer: Self-pay | Admitting: Family

## 2016-10-08 ENCOUNTER — Ambulatory Visit (INDEPENDENT_AMBULATORY_CARE_PROVIDER_SITE_OTHER): Payer: 59 | Admitting: Family

## 2016-10-08 DIAGNOSIS — J329 Chronic sinusitis, unspecified: Secondary | ICD-10-CM | POA: Insufficient documentation

## 2016-10-08 DIAGNOSIS — J0110 Acute frontal sinusitis, unspecified: Secondary | ICD-10-CM

## 2016-10-08 MED ORDER — AMOXICILLIN-POT CLAVULANATE 875-125 MG PO TABS: 1.0000 | ORAL_TABLET | Freq: Two times a day (BID) | ORAL | 0 refills | Status: DC

## 2016-10-08 MED ORDER — PROMETHAZINE-CODEINE 6.25-10 MG/5ML PO SYRP: 5.0000 mL | ORAL_SOLUTION | Freq: Four times a day (QID) | ORAL | 0 refills | Status: DC | PRN

## 2016-10-08 NOTE — Assessment & Plan Note (Signed)
Symptoms and exam consistent with bacterial sinusitis. Start Augmentin. Start promethazine with codeine as needed for cough and sleep. Continue over-the-counter medications as needed for symptom relief and supportive care. Follow-up if symptoms worsen or do not improve.

## 2016-10-08 NOTE — Progress Notes (Signed)
Subjective:    Patient ID: Jillian Mcknight, female    DOB: 08/23/1966, 50 y.o.   MRN: [id number]  Chief Complaint  Patient presents with  . Nasal Congestion    sore throat, chest congestion, cough, sinus pressure, x1 week    HPI:  Jillian Mcknight is a 50 y.o. female who  has a past medical history of Allergy; Arthritis; Carpal tunnel syndrome of right wrist; Depression; and Headache, frequent episodic tension-type. and presents today for an acute office visit.   This is new problem. Associated symptoms of sore throat, chest congestion, cough and sinus pressure have been going on for about 2 weeks. No fevers. Severity of the cough is enough to effect her lifestyle and sleep. No fevers. Modifying factors include Sudafed, Delsym, ibuprofen, cough drops which help temporarily with the symptoms. No recent antibiotics. Overall course is worsening.  Allergies  Allergen Reactions  . Sulfamethoxazole     Other reaction(s): GI Upset (intolerance)      Outpatient Medications Prior to Visit  Medication Sig Dispense Refill  . amphetamine-dextroamphetamine (ADDERALL) 10 MG tablet Take 1 tablet (10 mg total) by mouth 2 (two) times daily with a meal. (Patient taking differently: Take 10 mg by mouth as needed. ) 60 tablet 0  . Diclofenac Sodium 2 % SOLN Apply 1 pump twice daily. (Patient taking differently: as needed. Apply 1 pump twice daily.) 112 g 3  . FLUoxetine (PROZAC) 20 MG capsule Take 1 capsule (20 mg total) by mouth daily. 90 capsule 3  . Ibuprofen-Famotidine 800-26.6 MG TABS One pill three times a day as needed 90 tablet 1  . montelukast (SINGULAIR) 10 MG tablet Take 1 tablet by mouth  daily 90 tablet 1  . HYDROcodone-acetaminophen (NORCO) 5-325 MG tablet Take 1 tablet by mouth every 6 (six) hours as needed for moderate pain. 30 tablet 0   No facility-administered medications prior to visit.      Review of Systems  Constitutional: Negative for chills and fever.  HENT: Positive for  congestion, ear pain, sinus pain, sinus pressure and sore throat.   Respiratory: Positive for cough and chest tightness. Negative for shortness of breath and wheezing.   Neurological: Positive for headaches.      Objective:    BP (!) 148/84 (BP Location: Left Arm, Patient Position: Sitting, Cuff Size: Normal)   Pulse 84   Temp 98.1 F (36.7 C) (Oral)   Resp 16   Ht 5\' 6"  (1.676 m)   Wt 170 lb (77.1 kg)   LMP  (LMP Unknown)   SpO2 98%   BMI 27.44 kg/m  Nursing note and vital signs reviewed.  Physical Exam  Constitutional: She is oriented to person, place, and time. She appears well-developed and well-nourished.  HENT:  Right Ear: Hearing, tympanic membrane, external ear and ear canal normal.  Left Ear: Hearing, tympanic membrane, external ear and ear canal normal.  Nose: Right sinus exhibits frontal sinus tenderness. Right sinus exhibits no maxillary sinus tenderness. Left sinus exhibits frontal sinus tenderness.  Mouth/Throat: Uvula is midline, oropharynx is clear and moist and mucous membranes are normal.  Neck: Neck supple.  Cardiovascular: Normal rate, regular rhythm, normal heart sounds and intact distal pulses.   Pulmonary/Chest: Effort normal and breath sounds normal.  Neurological: She is alert and oriented to person, place, and time.  Skin: Skin is warm and dry.       Assessment & Plan:   Problem List Items Addressed This Visit      Respiratory  Sinusitis    Symptoms and exam consistent with bacterial sinusitis. Start Augmentin. Start promethazine with codeine as needed for cough and sleep. Continue over-the-counter medications as needed for symptom relief and supportive care. Follow-up if symptoms worsen or do not improve.      Relevant Medications   amoxicillin-clavulanate (AUGMENTIN) 875-125 MG tablet   promethazine-codeine (PHENERGAN WITH CODEINE) 6.25-10 MG/5ML syrup       I have discontinued Ms. Cork's HYDROcodone-acetaminophen. I am also having  her start on amoxicillin-clavulanate and promethazine-codeine. Additionally, I am having her maintain her Diclofenac Sodium, amphetamine-dextroamphetamine, montelukast, Ibuprofen-Famotidine, and FLUoxetine.   Meds ordered this encounter  Medications  . amoxicillin-clavulanate (AUGMENTIN) 875-125 MG tablet    Sig: Take 1 tablet by mouth 2 (two) times daily.    Dispense:  14 tablet    Refill:  0    Order Specific Question:   Supervising Provider    Answer:   Pricilla Holm A [id number]  . promethazine-codeine (PHENERGAN WITH CODEINE) 6.25-10 MG/5ML syrup    Sig: Take 5 mLs by mouth every 6 (six) hours as needed for cough.    Dispense:  180 mL    Refill:  0    Order Specific Question:   Supervising Provider    Answer:   Pricilla Holm A [id number]     Follow-up: Return if symptoms worsen or fail to improve.  Mauricio Po, FNP

## 2016-10-08 NOTE — Patient Instructions (Signed)
Thank you for choosing [facility].  SUMMARY AND INSTRUCTIONS:  Medication:  Your prescription(s) have been submitted to your pharmacy or been printed and provided for you. Please take as directed and contact our office if you believe you are having problem(s) with the medication(s) or have any questions.   Follow up:  If your symptoms worsen or fail to improve, please contact our office for further instruction, or in case of emergency go directly to the emergency room at the closest medical facility.    General Recommendations:    Please drink plenty of fluids.  Get plenty of rest   Sleep in humidified air  Use saline nasal sprays  Netti pot   OTC Medications:  Decongestants - helps relieve congestion   Flonase (generic fluticasone) or Nasacort (generic triamcinolone) - please make sure to use the "cross-over" technique at a 45 degree angle towards the opposite eye as opposed to straight up the nasal passageway.   Sudafed (generic pseudoephedrine - Note this is the one that is available behind the pharmacy counter); Products with phenylephrine (-PE) may also be used but is often not as effective as pseudoephedrine.   If you have HIGH BLOOD PRESSURE - Coricidin HBP; AVOID any product that is -D as this contains pseudoephedrine which may increase your blood pressure.  Afrin (oxymetazoline) every 6-8 hours for up to 3 days.   Allergies - helps relieve runny nose, itchy eyes and sneezing   Claritin (generic loratidine), Allegra (fexofenidine), or Zyrtec (generic cyrterizine) for runny nose. These medications should not cause drowsiness.  Note - Benadryl (generic diphenhydramine) may be used however may cause drowsiness  Cough -   Delsym or Robitussin (generic dextromethorphan)  Expectorants - helps loosen mucus to ease removal   Mucinex (generic guaifenesin) as directed on the package.  Headaches / General Aches   Tylenol (generic acetaminophen) - DO  NOT EXCEED 3 grams (3,000 mg) in a 24 hour time period  Advil/Motrin (generic ibuprofen)   Sore Throat -   Salt water gargle   Chloraseptic (generic benzocaine) spray or lozenges / Sucrets (generic dyclonine)    Sinusitis Sinusitis is redness, soreness, and inflammation of the paranasal sinuses. Paranasal sinuses are air pockets within the bones of your face (beneath the eyes, the middle of the forehead, or above the eyes). In healthy paranasal sinuses, mucus is able to drain out, and air is able to circulate through them by way of your nose. However, when your paranasal sinuses are inflamed, mucus and air can become trapped. This can allow bacteria and other germs to grow and cause infection. Sinusitis can develop quickly and last only a short time (acute) or continue over a long period (chronic). Sinusitis that lasts for more than 12 weeks is considered chronic.  CAUSES  Causes of sinusitis include:  Allergies.  Structural abnormalities, such as displacement of the cartilage that separates your nostrils (deviated septum), which can decrease the air flow through your nose and sinuses and affect sinus drainage.  Functional abnormalities, such as when the small hairs (cilia) that line your sinuses and help remove mucus do not work properly or are not present. SIGNS AND SYMPTOMS  Symptoms of acute and chronic sinusitis are the same. The primary symptoms are pain and pressure around the affected sinuses. Other symptoms include:  Upper toothache.  Earache.  Headache.  Bad breath.  Decreased sense of smell and taste.  A cough, which worsens when you are lying flat.  Fatigue.  Fever.  Thick drainage   from your nose, which often is green and may contain pus (purulent).  Swelling and warmth over the affected sinuses. DIAGNOSIS  Your health care provider will perform a physical exam. During the exam, your health care provider may:  Look in your nose for signs of abnormal growths  in your nostrils (nasal polyps).  Tap over the affected sinus to check for signs of infection.  View the inside of your sinuses (endoscopy) using an imaging device that has a light attached (endoscope). If your health care provider suspects that you have chronic sinusitis, one or more of the following tests may be recommended:  Allergy tests.  Nasal culture. A sample of mucus is taken from your nose, sent to a lab, and screened for bacteria.  Nasal cytology. A sample of mucus is taken from your nose and examined by your health care provider to determine if your sinusitis is related to an allergy. TREATMENT  Most cases of acute sinusitis are related to a viral infection and will resolve on their own within 10 days. Sometimes medicines are prescribed to help relieve symptoms (pain medicine, decongestants, nasal steroid sprays, or saline sprays).  However, for sinusitis related to a bacterial infection, your health care provider will prescribe antibiotic medicines. These are medicines that will help kill the bacteria causing the infection.  Rarely, sinusitis is caused by a fungal infection. In theses cases, your health care provider will prescribe antifungal medicine. For some cases of chronic sinusitis, surgery is needed. Generally, these are cases in which sinusitis recurs more than 3 times per year, despite other treatments. HOME CARE INSTRUCTIONS   Drink plenty of water. Water helps thin the mucus so your sinuses can drain more easily.  Use a humidifier.  Inhale steam 3 to 4 times a day (for example, sit in the bathroom with the shower running).  Apply a warm, moist washcloth to your face 3 to 4 times a day, or as directed by your health care provider.  Use saline nasal sprays to help moisten and clean your sinuses.  Take medicines only as directed by your health care provider.  If you were prescribed either an antibiotic or antifungal medicine, finish it all even if you start to feel  better. SEEK IMMEDIATE MEDICAL CARE IF:  You have increasing pain or severe headaches.  You have nausea, vomiting, or drowsiness.  You have swelling around your face.  You have vision problems.  You have a stiff neck.  You have difficulty breathing. MAKE SURE YOU:   Understand these instructions.  Will watch your condition.  Will get help right away if you are not doing well or get worse. Document Released: 10/04/2005 Document Revised: 02/18/2014 Document Reviewed: 10/19/2011 Exit[employer] Patient Information 2015 [employer]. This information is not intended to replace advice given to you by your health care provider. Make sure you discuss any questions you have with your health care provider.   

## 2016-10-29 ENCOUNTER — Telehealth: Payer: Self-pay | Admitting: Family

## 2016-10-29 DIAGNOSIS — J0110 Acute frontal sinusitis, unspecified: Secondary | ICD-10-CM

## 2016-10-29 MED ORDER — AMOXICILLIN-POT CLAVULANATE 875-125 MG PO TABS: 1.0000 | ORAL_TABLET | Freq: Two times a day (BID) | ORAL | 0 refills | Status: DC

## 2016-10-29 NOTE — Telephone Encounter (Signed)
Routing to greg, please advise thanks

## 2016-10-29 NOTE — Telephone Encounter (Signed)
Patient states she still feels like she has sinus infection in her head that has continued since last appt.  Patient would like to know if another round of antibiotics could be sent to [facility] on [address].

## 2016-10-29 NOTE — Telephone Encounter (Signed)
Medication sent to pharmacy  

## 2016-10-29 NOTE — Telephone Encounter (Signed)
Left message advising patient that rx has been sent to pharm

## 2016-12-06 ENCOUNTER — Ambulatory Visit
Admission: RE | Admit: 2016-12-06 | Discharge: 2016-12-06 | Disposition: A | Discharge status: Home / Self Care | Payer: 59 | Source: Ambulatory Visit | Attending: Internal Medicine | Admitting: Internal Medicine

## 2016-12-06 DIAGNOSIS — Z1231 Encounter for screening mammogram for malignant neoplasm of breast: Secondary | ICD-10-CM

## 2017-02-07 ENCOUNTER — Telehealth: Payer: Self-pay | Admitting: Internal Medicine

## 2017-02-07 NOTE — Telephone Encounter (Signed)
Pt called and stated for her next rx for FLUoxetine (PROZAC) 20 MG capsule she would like 10mg  2x per day. She states some days she only take 10mg  because she is trying eventually take 10mg  and get off of 20mg . She has 3 weeks left of Rx  [facility].

## 2017-02-08 MED ORDER — FLUOXETINE HCL 10 MG PO TABS: 20.0000 mg | ORAL_TABLET | Freq: Every day | ORAL | 3 refills | Status: DC

## 2017-02-08 NOTE — Telephone Encounter (Signed)
Sent in to her pharmacy listed.

## 2017-02-08 NOTE — Telephone Encounter (Signed)
Please advise 

## 2017-02-08 NOTE — Telephone Encounter (Signed)
Resent to [facility]

## 2017-05-11 ENCOUNTER — Ambulatory Visit: Payer: 59 | Admitting: Family Medicine

## 2017-05-28 NOTE — Progress Notes (Signed)
[facility] [address] [place], [place] [address] Phone: [phone] Subjective:      CC: Bilateral knee pain  [id number]  Jillian Mcknight is a 51 y.o. female coming in with complaint of left knee pain. Patient has not been seen for greater than a year and a half. Patient had some difficulty with left knee previously and did have on popliteal bursitis. Patient has had aspiration done before. Patient states She is been trying to work on and is having worsening pain in the knees bilaterally. Patient states that has been working amounts notices when doing jumping or squatting has worsening pain. Right now greater than left. Noticing after a lot of activity the right leg is more difficult to flex.     Past Medical History:  Diagnosis Date  . Allergy   . Arthritis    knees  . Carpal tunnel syndrome of right wrist   . Depression   . Headache, frequent episodic tension-type    Past Surgical History:  Procedure Laterality Date  . CARPAL TUNNEL RELEASE Right 08/10/2016   Procedure: RIGHT CARPAL TUNNEL RELEASE;  Surgeon: Daryll Brod, MD;  Location: [facility];  Service: Orthopedics;  Laterality: Right;  FAB  . WISDOM TOOTH EXTRACTION  1982   Social History   Social History  . Marital status: Married    Spouse name: N/A  . Number of children: N/A  . Years of education: N/A   Social History Main Topics  . Smoking status: Never Smoker  . Smokeless tobacco: Never Used  . Alcohol use 0.0 oz/week     Comment: socially  . Drug use: No  . Sexual activity: Not on file   Other Topics Concern  . Not on file   Social History Narrative  . No narrative on file   Allergies  Allergen Reactions  . Sulfamethoxazole     Other reaction(s): GI Upset (intolerance)   Family History  Problem Relation Age of Onset  . Heart disease Mother   . Stroke Mother   . Lymphoma Mother   . Alzheimer's disease Father   . Cancer Cousin   . Colon cancer  Cousin 72       colon  . Colon cancer Cousin 64       colon  . Diabetes Paternal Grandmother   . Colitis Daughter      Past medical history, social, surgical and family history all reviewed in electronic medical record.  No pertanent information unless stated regarding to the chief complaint.   Review of Systems: No headache, visual changes, nausea, vomiting, diarrhea, constipation, dizziness, abdominal pain, skin rash, fevers, chills, night sweats, weight loss, swollen lymph nodes, body aches,muscle aches, chest pain, shortness of breath, mood changes.  Mild positive joint swelling of the right knee  Objective  There were no vitals taken for this visit. Systems examined below as of 05/28/17   General: No apparent distress alert and oriented x3 mood and affect normal, dressed appropriately.  HEENT: Pupils equal, extraocular movements intact  Respiratory: Patient's speak in full sentences and does not appear short of breath  Cardiovascular: No lower extremity edema, non tender, no erythema  Skin: Warm dry intact with no signs of infection or rash on extremities or on axial skeleton.  Abdomen: Soft nontender  Neuro: Cranial nerves II through XII are intact, neurovascularly intact in all extremities with 2+ DTRs and 2+ pulses.  Lymph: No lymphadenopathy of posterior or anterior cervical chain or axillae  bilaterally.  Gait normal with good balance and coordination.  MSK:  Non tender with full range of motion and good stability and symmetric strength and tone of shoulders, elbows, wrist, hip, and ankles bilaterally.  Knee:Bilateral Mild lateral tilt of the patella right greater than left Tender to palpation the paraspinal musculature as well as in the right lateral side ROM full in flexion and extension and lower leg rotation. Ligaments with solid consistent endpoints including ACL, PCL, LCL, MCL. Negative Mcmurray's, Apley's, and Thessalonian tests. painful patellar  compression. Patellar glide with mild crepitus.right greater then left  Patellar and quadriceps tendons unremarkable. Hamstring and quadriceps strength is normal.    MSK US performed of: right This study was ordered, performed, and interpreted by Charlann Boxer D.O.  Knee: Patient does on the right side having some patellofemoral arthritis as well as a mild medial arthritis. Trace effusion noted.    IMPRESSION:  Patellofemoral arthritis    Impression and Recommendations:     This case required medical decision making of moderate complexity.      Note: This dictation was prepared with Dragon dictation along with smaller phrase technology. Any transcriptional errors that result from this process are unintentional.

## 2017-05-30 ENCOUNTER — Ambulatory Visit: Payer: Self-pay

## 2017-05-30 ENCOUNTER — Encounter: Payer: Self-pay | Admitting: Family Medicine

## 2017-05-30 ENCOUNTER — Ambulatory Visit (INDEPENDENT_AMBULATORY_CARE_PROVIDER_SITE_OTHER): Payer: 59 | Admitting: Family Medicine

## 2017-05-30 VITALS — BP 116/78 | HR 92 | Ht 66.0 in | Wt 173.0 lb

## 2017-05-30 DIAGNOSIS — M1711 Unilateral primary osteoarthritis, right knee: Secondary | ICD-10-CM | POA: Insufficient documentation

## 2017-05-30 DIAGNOSIS — M25561 Pain in right knee: Secondary | ICD-10-CM | POA: Diagnosis not present

## 2017-05-30 NOTE — Assessment & Plan Note (Signed)
Patient does have some patellofemoral arthritis. Likely bilateral. Home exercises given and bracing. Topical anti-inflammatories. We'll try conservative therapy and come back in 4 weeks. Worsening symptoms consider injection

## 2017-05-30 NOTE — Patient Instructions (Addendum)
Good to see you  Ice 20 minutes 2 times daily. Usually after activity and before bed. Try brace with activity  Biking, elliptical and swimming is better Avoid deep squats jumping or lunges  pennsaid pinkie amount topically 2 times daily as needed.   See me again in 4-6 weeks.

## 2017-06-28 ENCOUNTER — Other Ambulatory Visit (INDEPENDENT_AMBULATORY_CARE_PROVIDER_SITE_OTHER): Payer: 59

## 2017-06-28 ENCOUNTER — Encounter: Payer: Self-pay | Admitting: Internal Medicine

## 2017-06-28 ENCOUNTER — Ambulatory Visit (INDEPENDENT_AMBULATORY_CARE_PROVIDER_SITE_OTHER): Payer: 59 | Admitting: Internal Medicine

## 2017-06-28 ENCOUNTER — Ambulatory Visit: Payer: Self-pay | Admitting: Family Medicine

## 2017-06-28 VITALS — BP 130/78 | HR 83 | Temp 98.2°F | Ht 66.0 in | Wt 175.0 lb

## 2017-06-28 DIAGNOSIS — Z0000 Encounter for general adult medical examination without abnormal findings: Secondary | ICD-10-CM

## 2017-06-28 DIAGNOSIS — Z23 Encounter for immunization: Secondary | ICD-10-CM | POA: Diagnosis not present

## 2017-06-28 LAB — CBC
HEMATOCRIT: 43.4 % (ref 36.0–46.0)
Hemoglobin: 14.3 g/dL (ref 12.0–15.0)
MCHC: 33 g/dL (ref 30.0–36.0)
MCV: 92.2 fl (ref 78.0–100.0)
PLATELETS: 296 10*3/uL (ref 150.0–400.0)
RBC: 4.71 Mil/uL (ref 3.87–5.11)
RDW: 13.5 % (ref 11.5–15.5)
WBC: 5.3 10*3/uL (ref 4.0–10.5)

## 2017-06-28 LAB — LIPID PANEL
CHOL/HDL RATIO: 3
Cholesterol: 272 mg/dL — ABNORMAL HIGH (ref 0–200)
HDL: 99 mg/dL (ref >39.00)
LDL CALC: 165 mg/dL — AB (ref 0–99)
NonHDL: 173.13
Triglycerides: 42 mg/dL (ref 0.0–149.0)
VLDL: 8.4 mg/dL (ref 0.0–40.0)

## 2017-06-28 LAB — COMPREHENSIVE METABOLIC PANEL
ALBUMIN: 4.3 g/dL (ref 3.5–5.2)
ALT: 24 U/L (ref 0–35)
AST: 23 U/L (ref 0–37)
Alkaline Phosphatase: 59 U/L (ref 39–117)
BUN: 15 mg/dL (ref 6–23)
CALCIUM: 9.8 mg/dL (ref 8.4–10.5)
CHLORIDE: 103 meq/L (ref 96–112)
CO2: 28 meq/L (ref 19–32)
CREATININE: 0.81 mg/dL (ref 0.40–1.20)
GFR: 78.7 mL/min (ref >60.00)
Glucose, Bld: 92 mg/dL (ref 70–99)
Potassium: 4.4 mEq/L (ref 3.5–5.1)
Sodium: 140 mEq/L (ref 135–145)
Total Bilirubin: 0.6 mg/dL (ref 0.2–1.2)
Total Protein: 7.1 g/dL (ref 6.0–8.3)

## 2017-06-28 LAB — VITAMIN D 25 HYDROXY (VIT D DEFICIENCY, FRACTURES): VITD: 53.38 ng/mL (ref 30.00–100.00)

## 2017-06-28 NOTE — Patient Instructions (Signed)
We will get the labs today and call you back with the results.   Health Maintenance, Female Adopting a healthy lifestyle and getting preventive care can go a long way to promote health and wellness. Talk with your health care provider about what schedule of regular examinations is right for you. This is a good chance for you to check in with your provider about disease prevention and staying healthy. In between checkups, there are plenty of things you can do on your own. Experts have done a lot of research about which lifestyle changes and preventive measures are most likely to keep you healthy. Ask your health care provider for more information. Weight and diet Eat a healthy diet  Be sure to include plenty of vegetables, fruits, low-fat dairy products, and lean protein.  Do not eat a lot of foods high in solid fats, added sugars, or salt.  Get regular exercise. This is one of the most important things you can do for your health. ? Most adults should exercise for at least 150 minutes each week. The exercise should increase your heart rate and make you sweat (moderate-intensity exercise). ? Most adults should also do strengthening exercises at least twice a week. This is in addition to the moderate-intensity exercise.  Maintain a healthy weight  Body mass index (BMI) is a measurement that can be used to identify possible weight problems. It estimates body fat based on height and weight. Your health care provider can help determine your BMI and help you achieve or maintain a healthy weight.  For females 89 years of age and older: ? A BMI below 18.5 is considered underweight. ? A BMI of 18.5 to 24.9 is normal. ? A BMI of 25 to 29.9 is considered overweight. ? A BMI of 30 and above is considered obese.  Watch levels of cholesterol and blood lipids  You should start having your blood tested for lipids and cholesterol at 52 years of age, then have this test every 5 years.  You may need to  have your cholesterol levels checked more often if: ? Your lipid or cholesterol levels are high. ? You are older than 53 years of age. ? You are at high risk for heart disease.  Cancer screening Lung Cancer  Lung cancer screening is recommended for adults 46-37 years old who are at high risk for lung cancer because of a history of smoking.  A yearly low-dose CT scan of the lungs is recommended for people who: ? Currently smoke. ? Have quit within the past 15 years. ? Have at least a 30-pack-year history of smoking. A pack year is smoking an average of one pack of cigarettes a day for 1 year.  Yearly screening should continue until it has been 15 years since you quit.  Yearly screening should stop if you develop a health problem that would prevent you from having lung cancer treatment.  Breast Cancer  Practice breast self-awareness. This means understanding how your breasts normally appear and feel.  It also means doing regular breast self-exams. Let your health care provider know about any changes, no matter how small.  If you are in your 20s or 30s, you should have a clinical breast exam (CBE) by a health care provider every 1-3 years as part of a regular health exam.  If you are 86 or older, have a CBE every year. Also consider having a breast X-ray (mammogram) every year.  If you have a family history of breast cancer, talk  to your health care provider about genetic screening.  If you are at high risk for breast cancer, talk to your health care provider about having an MRI and a mammogram every year.  Breast cancer gene (BRCA) assessment is recommended for women who have family members with BRCA-related cancers. BRCA-related cancers include: ? Breast. ? Ovarian. ? Tubal. ? Peritoneal cancers.  Results of the assessment will determine the need for genetic counseling and BRCA1 and BRCA2 testing.  Cervical Cancer Your health care provider may recommend that you be screened  regularly for cancer of the pelvic organs (ovaries, uterus, and vagina). This screening involves a pelvic examination, including checking for microscopic changes to the surface of your cervix (Pap test). You may be encouraged to have this screening done every 3 years, beginning at age 21.  For women ages 30-65, health care providers may recommend pelvic exams and Pap testing every 3 years, or they may recommend the Pap and pelvic exam, combined with testing for human papilloma virus (HPV), every 5 years. Some types of HPV increase your risk of cervical cancer. Testing for HPV may also be done on women of any age with unclear Pap test results.  Other health care providers may not recommend any screening for nonpregnant women who are considered low risk for pelvic cancer and who do not have symptoms. Ask your health care provider if a screening pelvic exam is right for you.  If you have had past treatment for cervical cancer or a condition that could lead to cancer, you need Pap tests and screening for cancer for at least 20 years after your treatment. If Pap tests have been discontinued, your risk factors (such as having a new sexual partner) need to be reassessed to determine if screening should resume. Some women have medical problems that increase the chance of getting cervical cancer. In these cases, your health care provider may recommend more frequent screening and Pap tests.  Colorectal Cancer  This type of cancer can be detected and often prevented.  Routine colorectal cancer screening usually begins at 53 years of age and continues through 53 years of age.  Your health care provider may recommend screening at an earlier age if you have risk factors for colon cancer.  Your health care provider may also recommend using home test kits to check for hidden blood in the stool.  A small camera at the end of a tube can be used to examine your colon directly (sigmoidoscopy or colonoscopy). This is  done to check for the earliest forms of colorectal cancer.  Routine screening usually begins at age 50.  Direct examination of the colon should be repeated every 5-10 years through 53 years of age. However, you may need to be screened more often if early forms of precancerous polyps or small growths are found.  Skin Cancer  Check your skin from head to toe regularly.  Tell your health care provider about any new moles or changes in moles, especially if there is a change in a mole's shape or color.  Also tell your health care provider if you have a mole that is larger than the size of a pencil eraser.  Always use sunscreen. Apply sunscreen liberally and repeatedly throughout the day.  Protect yourself by wearing long sleeves, pants, a wide-brimmed hat, and sunglasses whenever you are outside.  Heart disease, diabetes, and high blood pressure  High blood pressure causes heart disease and increases the risk of stroke. High blood pressure is more   likely to develop in: ? People who have blood pressure in the high end of the normal range (130-139/85-89 mm Hg). ? People who are overweight or obese. ? People who are African American.  If you are 90-95 years of age, have your blood pressure checked every 3-5 years. If you are 4 years of age or older, have your blood pressure checked every year. You should have your blood pressure measured twice-once when you are at a hospital or clinic, and once when you are not at a hospital or clinic. Record the average of the two measurements. To check your blood pressure when you are not at a hospital or clinic, you can use: ? An automated blood pressure machine at a pharmacy. ? A home blood pressure monitor.  If you are between 44 years and 21 years old, ask your health care provider if you should take aspirin to prevent strokes.  Have regular diabetes screenings. This involves taking a blood sample to check your fasting blood sugar level. ? If you are  at a normal weight and have a low risk for diabetes, have this test once every three years after 53 years of age. ? If you are overweight and have a high risk for diabetes, consider being tested at a younger age or more often. Preventing infection Hepatitis B  If you have a higher risk for hepatitis B, you should be screened for this virus. You are considered at high risk for hepatitis B if: ? You were born in a country where hepatitis B is common. Ask your health care provider which countries are considered high risk. ? Your parents were born in a high-risk country, and you have not been immunized against hepatitis B (hepatitis B vaccine). ? You have HIV or AIDS. ? You use needles to inject street drugs. ? You live with someone who has hepatitis B. ? You have had sex with someone who has hepatitis B. ? You get hemodialysis treatment. ? You take certain medicines for conditions, including cancer, organ transplantation, and autoimmune conditions.  Hepatitis C  Blood testing is recommended for: ? Everyone born from [birth year] through 1967. ? Anyone with known risk factors for hepatitis C.  Sexually transmitted infections (STIs)  You should be screened for sexually transmitted infections (STIs) including gonorrhea and chlamydia if: ? You are sexually active and are younger than 53 years of age. ? You are older than 53 years of age and your health care provider tells you that you are at risk for this type of infection. ? Your sexual activity has changed since you were last screened and you are at an increased risk for chlamydia or gonorrhea. Ask your health care provider if you are at risk.  If you do not have HIV, but are at risk, it may be recommended that you take a prescription medicine daily to prevent HIV infection. This is called pre-exposure prophylaxis (PrEP). You are considered at risk if: ? You are sexually active and do not regularly use condoms or know the HIV status of your  partner(s). ? You take drugs by injection. ? You are sexually active with a partner who has HIV.  Talk with your health care provider about whether you are at high risk of being infected with HIV. If you choose to begin PrEP, you should first be tested for HIV. You should then be tested every 3 months for as long as you are taking PrEP. Pregnancy  If you are premenopausal and you may  become pregnant, ask your health care provider about preconception counseling.  If you may become pregnant, take 400 to 800 micrograms (mcg) of folic acid every day.  If you want to prevent pregnancy, talk to your health care provider about birth control (contraception). Osteoporosis and menopause  Osteoporosis is a disease in which the bones lose minerals and strength with aging. This can result in serious bone fractures. Your risk for osteoporosis can be identified using a bone density scan.  If you are 40 years of age or older, or if you are at risk for osteoporosis and fractures, ask your health care provider if you should be screened.  Ask your health care provider whether you should take a calcium or vitamin D supplement to lower your risk for osteoporosis.  Menopause may have certain physical symptoms and risks.  Hormone replacement therapy may reduce some of these symptoms and risks. Talk to your health care provider about whether hormone replacement therapy is right for you. Follow these instructions at home:  Schedule regular health, dental, and eye exams.  Stay current with your immunizations.  Do not use any tobacco products including cigarettes, chewing tobacco, or electronic cigarettes.  If you are pregnant, do not drink alcohol.  If you are breastfeeding, limit how much and how often you drink alcohol.  Limit alcohol intake to no more than 1 drink per day for nonpregnant women. One drink equals 12 ounces of beer, 5 ounces of wine, or 1 ounces of hard liquor.  Do not use street  drugs.  Do not share needles.  Ask your health care provider for help if you need support or information about quitting drugs.  Tell your health care provider if you often feel depressed.  Tell your health care provider if you have ever been abused or do not feel safe at home. This information is not intended to replace advice given to you by your health care provider. Make sure you discuss any questions you have with your health care provider. Document Released: 04/19/2011 Document Revised: 03/11/2016 Document Reviewed: 07/08/2015 Elsevier Interactive Patient Education  [employer].

## 2017-06-28 NOTE — Assessment & Plan Note (Signed)
Flu vaccine given at visit. Tdap up to date. Declines HIV and hep c screening. Checking labs and adjust as needed. Current CV risk about 1%. Counseled on sun safety and mole surveillance (fair skin and only intermittent sunscreen usage). Given screening recommendations. Counseled about dangers of distracted driving.

## 2017-06-28 NOTE — Progress Notes (Signed)
   Subjective:    Patient ID: Jillian Mcknight, female    DOB: June 22, 1966, 51 y.o.   MRN: [id number]  HPI The patient is a 51 YO female coming in for wellness. No new concerns.   PMH, [facility], social history reviewed and updated.   Review of Systems  Constitutional: Negative.   HENT: Negative.   Eyes: Negative.   Respiratory: Negative for cough, chest tightness and shortness of breath.   Cardiovascular: Negative for chest pain, palpitations and leg swelling.  Gastrointestinal: Negative for abdominal distention, abdominal pain, constipation, diarrhea, nausea and vomiting.  Musculoskeletal: Negative.   Skin: Negative.   Neurological: Negative.   Psychiatric/Behavioral: Negative.       Objective:   Physical Exam  Constitutional: She is oriented to person, place, and time. She appears well-developed and well-nourished.  HENT:  Head: Normocephalic and atraumatic.  Eyes: EOM are normal.  Neck: Normal range of motion.  Cardiovascular: Normal rate and regular rhythm.   Pulmonary/Chest: Effort normal and breath sounds normal. No respiratory distress. She has no wheezes. She has no rales.  Abdominal: Soft. Bowel sounds are normal. She exhibits no distension. There is no tenderness. There is no rebound.  Musculoskeletal: She exhibits no edema.  Neurological: She is alert and oriented to person, place, and time. Coordination normal.  Skin: Skin is warm and dry.  Psychiatric: She has a normal mood and affect.   Vitals:   06/28/17 0759  BP: 130/78  Pulse: 83  Temp: 98.2 F (36.8 C)  TempSrc: Oral  SpO2: 98%  Weight: 175 lb (79.4 kg)  Height: 5\' 6"  (1.676 m)      Assessment & Plan:  Flu shot given at visit.

## 2017-07-27 ENCOUNTER — Telehealth: Payer: Self-pay | Admitting: Internal Medicine

## 2017-07-28 ENCOUNTER — Other Ambulatory Visit: Payer: Self-pay

## 2017-07-28 MED ORDER — MONTELUKAST SODIUM 10 MG PO TABS
10.0000 mg | ORAL_TABLET | Freq: Every day | ORAL | 1 refills | Status: DC
Start: 2017-07-28 — End: 2018-01-25

## 2017-07-28 MED ORDER — MONTELUKAST SODIUM 10 MG PO TABS: 10.0000 mg | ORAL_TABLET | Freq: Every day | ORAL | 1 refills | Status: DC

## 2017-07-28 NOTE — Telephone Encounter (Signed)
[facility] called requesting that this medication be sent to them.

## 2017-07-28 NOTE — Telephone Encounter (Signed)
Sent to [facility]

## 2017-08-16 ENCOUNTER — Ambulatory Visit: Payer: Self-pay | Admitting: Family Medicine

## 2017-09-16 ENCOUNTER — Other Ambulatory Visit: Payer: Self-pay | Admitting: *Deleted

## 2017-09-16 ENCOUNTER — Telehealth: Payer: Self-pay | Admitting: Internal Medicine

## 2017-09-16 MED ORDER — FLUOXETINE HCL 10 MG PO TABS: 20.0000 mg | ORAL_TABLET | Freq: Every day | ORAL | 3 refills | Status: DC

## 2017-09-16 NOTE — Telephone Encounter (Signed)
Copied from [place] [id number]. Topic: Quick Communication - Rx Refill/Question >> Sep 16, 2017  9:01 AM Carolyn Stare wrote:  FLUoxetine (PROZAC) 10 MG tablet  Pt is requesting  90 days supply 180 pills   Pharmacy   [facility] mail order

## 2017-10-20 NOTE — Telephone Encounter (Signed)
Called patient because the pharmacy called confused, the patient called asking for a 90 day supply, , she has 2 refills left and wants those canceled and a new Rx sent into [facility] for a 90 days supply, please advise

## 2017-10-21 ENCOUNTER — Other Ambulatory Visit: Payer: Self-pay

## 2017-10-21 MED ORDER — FLUOXETINE HCL 10 MG PO TABS: 20.0000 mg | ORAL_TABLET | Freq: Every day | ORAL | 3 refills | Status: DC

## 2017-10-21 NOTE — Telephone Encounter (Signed)
Medication sent to [facility] 

## 2017-10-21 NOTE — Telephone Encounter (Signed)
What is the question? She is up to date on visits so 90 day supply fine.

## 2017-11-07 ENCOUNTER — Other Ambulatory Visit: Payer: Self-pay | Admitting: Internal Medicine

## 2017-11-07 DIAGNOSIS — Z1231 Encounter for screening mammogram for malignant neoplasm of breast: Secondary | ICD-10-CM

## 2017-12-12 ENCOUNTER — Ambulatory Visit
Admission: RE | Admit: 2017-12-12 | Discharge: 2017-12-12 | Disposition: A | Discharge status: Home / Self Care | Payer: 59 | Source: Ambulatory Visit | Attending: Internal Medicine | Admitting: Internal Medicine

## 2017-12-12 DIAGNOSIS — Z1231 Encounter for screening mammogram for malignant neoplasm of breast: Secondary | ICD-10-CM

## 2018-01-09 DIAGNOSIS — Z23 Encounter for immunization: Secondary | ICD-10-CM | POA: Diagnosis not present

## 2018-01-25 ENCOUNTER — Other Ambulatory Visit: Payer: Self-pay | Admitting: Internal Medicine

## 2018-04-26 ENCOUNTER — Telehealth: Payer: Self-pay

## 2018-04-26 NOTE — Telephone Encounter (Signed)
Patient would like to transfer from Dr. Sharlet Salina to Dr. Deborra Medina.    Copied from [place] [id number]. Topic: Appointment Scheduling - Scheduling Inquiry for Clinic >> Apr 26, 2018  2:15 PM Scherrie Gerlach wrote: Reason for CRM:  pt would like to switch to Dr Deborra Medina from Dr Sharlet Salina. Pt lives closer to this location. Is that ok?

## 2018-04-27 NOTE — Telephone Encounter (Signed)
Okay with me 

## 2018-04-27 NOTE — Telephone Encounter (Signed)
Is the transfer okay with Dr. Deborra Medina?

## 2018-04-27 NOTE — Telephone Encounter (Signed)
Fine with me

## 2018-04-28 NOTE — Telephone Encounter (Signed)
FYI

## 2018-06-17 ENCOUNTER — Other Ambulatory Visit: Payer: Self-pay | Admitting: Internal Medicine

## 2018-06-24 DIAGNOSIS — Z23 Encounter for immunization: Secondary | ICD-10-CM | POA: Diagnosis not present

## 2018-06-29 ENCOUNTER — Encounter: Payer: Self-pay | Admitting: Family Medicine

## 2018-07-04 ENCOUNTER — Encounter: Payer: Self-pay | Admitting: Internal Medicine

## 2018-08-24 ENCOUNTER — Emergency Department (HOSPITAL_BASED_OUTPATIENT_CLINIC_OR_DEPARTMENT_OTHER): Payer: 59

## 2018-08-24 ENCOUNTER — Emergency Department (HOSPITAL_BASED_OUTPATIENT_CLINIC_OR_DEPARTMENT_OTHER)
Admission: EM | Admit: 2018-08-24 | Discharge: 2018-08-24 | Disposition: A | Discharge status: Home / Self Care | Payer: 59 | Attending: Emergency Medicine | Admitting: Emergency Medicine

## 2018-08-24 ENCOUNTER — Encounter (HOSPITAL_BASED_OUTPATIENT_CLINIC_OR_DEPARTMENT_OTHER): Payer: Self-pay

## 2018-08-24 ENCOUNTER — Other Ambulatory Visit: Payer: Self-pay

## 2018-08-24 DIAGNOSIS — W07XXXA Fall from chair, initial encounter: Secondary | ICD-10-CM | POA: Insufficient documentation

## 2018-08-24 DIAGNOSIS — Y9389 Activity, other specified: Secondary | ICD-10-CM | POA: Diagnosis not present

## 2018-08-24 DIAGNOSIS — S59902A Unspecified injury of left elbow, initial encounter: Secondary | ICD-10-CM | POA: Diagnosis not present

## 2018-08-24 DIAGNOSIS — Z79899 Other long term (current) drug therapy: Secondary | ICD-10-CM | POA: Diagnosis not present

## 2018-08-24 DIAGNOSIS — M25522 Pain in left elbow: Secondary | ICD-10-CM | POA: Diagnosis not present

## 2018-08-24 DIAGNOSIS — Y92018 Other place in single-family (private) house as the place of occurrence of the external cause: Secondary | ICD-10-CM | POA: Insufficient documentation

## 2018-08-24 DIAGNOSIS — Y999 Unspecified external cause status: Secondary | ICD-10-CM | POA: Diagnosis not present

## 2018-08-24 DIAGNOSIS — W19XXXA Unspecified fall, initial encounter: Secondary | ICD-10-CM

## 2018-08-24 MED ORDER — OXYCODONE-ACETAMINOPHEN 5-325 MG PO TABS
1.0000 | ORAL_TABLET | Freq: Once | ORAL | Status: AC
  Administered 2018-08-24: 1 via ORAL
  Filled 2018-08-24: qty 1

## 2018-08-24 MED ORDER — OXYCODONE-ACETAMINOPHEN 5-325 MG PO TABS: 1.0000 | ORAL_TABLET | ORAL | 0 refills | Status: DC | PRN

## 2018-08-24 NOTE — ED Notes (Signed)
Pt had 800mg  Ibuprofen PTA.

## 2018-08-24 NOTE — ED Notes (Signed)
Patient transported to X-ray 

## 2018-08-24 NOTE — ED Triage Notes (Signed)
Pt injured left elbow when fell from chair just prior to arrival-NAD-steady gait

## 2018-08-24 NOTE — Discharge Instructions (Addendum)
Thank you for allowing me to care for you today in the Emergency Department.   Please keep the splint in place until you follow-up with orthopedics.  Call [facility] orthopedics tomorrow morning to schedule a follow-up appointment.  Typically, the orthopedist will repeat an x-ray within the next week.  Take 600 mg of ibuprofen with food or 650 mg of Tylenol every 6 hours for pain control.  You can also alternate between these 2 medications every 3 hours.  For severe pain, you can take 1 tablet of Percocet every 6 hours. Each tablet of Percocet has 325 mg of Tylenol.  Do not take more than 4000 mg of Tylenol from all sources in a 24-hour.  Return to the emergency department if your fingertips turn blue, if you develop high fever, new numbness or weakness in your left arm, have another fall or injury, or have other new, concerning symptoms.

## 2018-08-24 NOTE — ED Notes (Signed)
ED Provider at bedside. 

## 2018-08-24 NOTE — ED Provider Notes (Signed)
[facility] EMERGENCY DEPARTMENT Provider Note   CSN: [id number] Arrival date & time: 08/24/18  2046     History   Chief Complaint Chief Complaint  Patient presents with  . Elbow Injury    HPI Jillian Mcknight is a 52 y.o. female with a history of arthritis and carpal tunnel of the right wrist who presents to the emergency department with a chief complaint of fall.  Patient reports that she was standing on a chair attempting to move objects in and out of her closet when she fell off the chair and landed on her left elbow.  She reports sudden onset left elbow pain immediately after the fall, which occurred at approximately 1930.  No numbness, weakness, left wrist or shoulder pain.  No history of previous left elbow surgery or injury.  Pain is worse with movement of the left elbow and improved with not moving the joint.  She treated her symptoms with Tylenol prior to arrival with no improvement.  She denies hitting her head, neck pain, back pain, LOC, nausea, or emesis.  The history is provided by the patient. No language interpreter was used.    Past Medical History:  Diagnosis Date  . Allergy   . Arthritis    knees  . Carpal tunnel syndrome of right wrist   . Depression   . Headache, frequent episodic tension-type     Patient Active Problem List   Diagnosis Date Noted  . Patellofemoral arthritis of right knee 05/30/2017  . Popliteal bursitis of left knee 12/29/2015  . Hypersomnia, idiopathic 12/27/2013  . 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 Lateral knee pain 04/06/2013  . IBS (irritable bowel syndrome) 04/06/2013  . Snoring 04/06/2013  . Routine general medical examination at a health care facility 05/15/2012    Past Surgical History:  Procedure Laterality Date  . CARPAL TUNNEL RELEASE Right 08/10/2016   Procedure: RIGHT CARPAL TUNNEL RELEASE;  Surgeon: Daryll Brod, MD;  Location: [facility];  Service: Orthopedics;  Laterality: Right;  FAB  .  WISDOM TOOTH EXTRACTION  1982     OB History   None      Home Medications    Prior to Admission medications   Medication Sig Start Date End Date Taking? Authorizing Provider  Diclofenac Sodium 2 % SOLN Apply 1 pump twice daily. Patient taking differently: as needed. Apply 1 pump twice daily. 09/06/14   Lyndal Pulley, DO  FLUoxetine (PROZAC) 10 MG tablet Take 2 tablets (20 mg total) by mouth daily. 10/21/17   Hoyt Koch, MD  Ibuprofen-Famotidine 800-26.6 MG TABS One pill three times a day as needed 01/12/16   Lyndal Pulley, DO  montelukast (SINGULAIR) 10 MG tablet Take 1 tablet (10 mg total) by mouth daily. -- Office visit needed for further refills 06/20/18   Hoyt Koch, MD  oxyCODONE-acetaminophen (PERCOCET/ROXICET) 5-325 MG tablet Take 1 tablet by mouth every 4 (four) hours as needed for severe pain. 08/24/18   Eusebia Grulke A, PA-C    Family History Family History  Problem Relation Age of Onset  . Heart disease Mother   . Stroke Mother   . Lymphoma Mother   . Alzheimer's disease Father   . Cancer Cousin   . Colon cancer Cousin 37       colon  . Colon cancer Cousin 29       colon  . Diabetes Paternal Grandmother   . Colitis Daughter     Social History Social History  Tobacco Use  . Smoking status: Never Smoker  . Smokeless tobacco: Never Used  Substance Use Topics  . Alcohol use: Yes    Alcohol/week: 0.0 standard drinks    Comment: socially  . Drug use: No     Allergies   Sulfamethoxazole   Review of Systems Review of Systems  Constitutional: Negative for activity change, chills and fever.  Respiratory: Negative for shortness of breath.   Cardiovascular: Negative for chest pain.  Gastrointestinal: Negative for abdominal pain.  Genitourinary: Negative for dysuria.  Musculoskeletal: Positive for arthralgias and myalgias. Negative for back pain and neck pain.  Skin: Negative for rash.  Allergic/Immunologic: Negative for  immunocompromised state.  Neurological: Negative for dizziness, weakness, numbness and headaches.  Psychiatric/Behavioral: Negative for confusion.     Physical Exam Updated Vital Signs BP (!) 116/96 (BP Location: Right Arm)   Pulse 96   Temp 98.5 F (36.9 C) (Oral)   Resp 18   Ht 5\' 6"  (1.676 m)   Wt 81.2 kg   LMP  (LMP Unknown)   SpO2 99%   BMI 28.89 kg/m   Physical Exam  Constitutional: No distress.  HENT:  Head: Normocephalic.  Eyes: Conjunctivae are normal.  Neck: Neck supple.  Cardiovascular: Normal rate and regular rhythm. Exam reveals no gallop and no friction rub.  No murmur heard. Pulmonary/Chest: Effort normal. No respiratory distress.  Abdominal: Soft. She exhibits no distension.  Musculoskeletal: She exhibits tenderness. She exhibits no edema or deformity.  Point tenderness to palpation over the left olecranon.  No tenderness to the left wrist or shoulder.  Full active and passive range of motion of the left wrist and shoulder.  Patient has significant pain with extension of the left elbow at >160 degrees and with flexion at ~15 degrees. Radial pulses are 2+ and dementia.  Sensation is intact and equal throughout the bilateral upper extremities.  Good capillary refill.  Neurological: She is alert.  Skin: Skin is warm. No rash noted.  Psychiatric: Her behavior is normal.  Nursing note and vitals reviewed.    ED Treatments / Results  Labs (all labs ordered are listed, but only abnormal results are displayed) Labs Reviewed - No data to display  EKG None  Radiology Dg Elbow Complete Left  Result Date: 08/24/2018 CLINICAL DATA:  52 y/o F; fall with left elbow pain. Initial encounter. EXAM: LEFT ELBOW - COMPLETE 3+ VIEW COMPARISON:  None. FINDINGS: There is no evidence of fracture, dislocation, or joint effusion. There is no evidence of arthropathy or other focal bone abnormality. Soft tissues are unremarkable. IMPRESSION: No acute fracture or dislocation  identified. Electronically Signed   By: Kristine Garbe M.D.   On: 08/24/2018 21:26    Procedures Procedures (including critical care time)  Medications Ordered in ED Medications  oxyCODONE-acetaminophen (PERCOCET/ROXICET) 5-325 MG per tablet 1 tablet (1 tablet Oral Given 08/24/18 2308)     Initial Impression / Assessment and Plan / ED Course  I have reviewed the triage vital signs and the nursing notes.  Pertinent labs & imaging results that were available during my care of the patient were reviewed by me and considered in my medical decision making (see chart for details).     52 year old female with history of carpal tunnel syndrome and arthritis presents to the emergency department with a chief complaint of fall.  She denies hitting her head, LOC, nausea, or emesis.  She had sudden onset left elbow pain after the fall during which she landed on her  left elbow.  On exam, she has point tenderness over the left olecranon.  X-ray of the left elbow is negative for fracture.  No obvious sail sign.  However, given her exam, I have a strong clinical suspicion for occult fracture.  Will place the patient in a posterior long-arm splint and advised to follow-up with orthopedics.  Will discharge the patient with a short course of pain medication.  A 65-month prescription history query was performed using the [place] CSRS prior to discharge. Strict return precautions given.  She is hemodynamically stable and in no acute distress.  She is safe for discharge home with outpatient follow-up at this time.  Final Clinical Impressions(s) / ED Diagnoses   Final diagnoses:  Fall, initial encounter  Elbow injury, left, initial encounter    ED Discharge Orders         Ordered    oxyCODONE-acetaminophen (PERCOCET/ROXICET) 5-325 MG tablet  Every 4 hours PRN     08/24/18 2304           Kieu Quiggle A, PA-C 08/25/18 0139    Gareth Morgan, MD 08/25/18 1305

## 2018-08-28 ENCOUNTER — Encounter (INDEPENDENT_AMBULATORY_CARE_PROVIDER_SITE_OTHER): Payer: Self-pay | Admitting: Orthopaedic Surgery

## 2018-08-28 ENCOUNTER — Ambulatory Visit (INDEPENDENT_AMBULATORY_CARE_PROVIDER_SITE_OTHER): Payer: 59 | Admitting: Orthopaedic Surgery

## 2018-08-28 DIAGNOSIS — S5002XA Contusion of left elbow, initial encounter: Secondary | ICD-10-CM

## 2018-08-28 NOTE — Progress Notes (Signed)
Office Visit Note   Patient: Jillian Mcknight           Date of Birth: Jan 03, 1966           MRN: [id number] Visit Date: 08/28/2018              Requested by: Hoyt Koch, MD [place], [place] [address] PCP: Hoyt Koch, MD   Assessment & Plan: Visit Diagnoses:  1. Contusion of left elbow, initial encounter     Plan: Given the pain and swelling she has as well as a bruising around the radial head I cannot rule out an occult fracture of the radial head.  I will have her still wear her elbow splint but I want her to remove for hygiene purposes and to gently work on flexion extension and rotation of the elbow.  I would like to see her back in 2 weeks.  I would like 3 views of her left elbow at that visit.  This would include AP lateral and oblique/radial head view.  Follow-Up Instructions: Return in about 2 weeks (around 09/11/2018).   Orders:  No orders of the defined types were placed in this encounter.  No orders of the defined types were placed in this encounter.     Procedures: No procedures performed   Clinical Data: No additional findings.   Subjective: Chief Complaint  Patient presents with  . Left Elbow - Pain  Patient is a very pleasant 52 year old right-hand-dominant female who sustained a mechanical fall of the chair last week landing on her left elbow directly.  She went to the urgent care center to [facility] and x-rays were obtained and they did not see a fracture.  They did place her appropriately in the splint due to swelling and pain she was having at her elbow.  She denies any shoulder pain and denies any numbness and tingling going down into her hand.  Is mainly certain motions of the elbow that cause her severe pain.  It seems like most of it is rotational.  She is never injured this elbow before.  HPI  Review of Systems Currently denies any systemic illnesses or acute medical problems.  Objective: Vital Signs: LMP   (LMP Unknown)   Physical Exam She is alert and oriented and in no acute distress Ortho Exam Examination her left elbow does show bruising over the radial head.  She is very tender to palpation over the radial head.  The olecranon feels normal.  There is swelling around her elbow in general.  She has significant pain on attempts of rotation as well as flexion extension of the elbow but there is no blocks to this. Specialty Comments:  No specialty comments available.  Imaging: No results found. Independent review of x-rays of her left elbow are seen.  I do not specifically see a fracture of the radial head but I cannot rule that out.  The radiologist did not feel that there was a fracture at all.  PMFS History: Patient Active Problem List   Diagnosis Date Noted  . Patellofemoral arthritis of right knee 05/30/2017  . Popliteal bursitis of left knee 12/29/2015  . Hypersomnia, idiopathic 12/27/2013  . 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 Lateral knee pain 04/06/2013  . IBS (irritable bowel syndrome) 04/06/2013  . Snoring 04/06/2013  . Routine general medical examination at a health care facility 05/15/2012   Past Medical History:  Diagnosis Date  . Allergy   . Arthritis  knees  . Carpal tunnel syndrome of right wrist   . Depression   . Headache, frequent episodic tension-type     Family History  Problem Relation Age of Onset  . Heart disease Mother   . Stroke Mother   . Lymphoma Mother   . Alzheimer's disease Father   . Cancer Cousin   . Colon cancer Cousin 87       colon  . Colon cancer Cousin 23       colon  . Diabetes Paternal Grandmother   . Colitis Daughter     Past Surgical History:  Procedure Laterality Date  . CARPAL TUNNEL RELEASE Right 08/10/2016   Procedure: RIGHT CARPAL TUNNEL RELEASE;  Surgeon: Daryll Brod, MD;  Location: [facility];  Service: Orthopedics;  Laterality: Right;  FAB  . WISDOM TOOTH EXTRACTION  1982   Social History    Occupational History  . Not on file  Tobacco Use  . Smoking status: Never Smoker  . Smokeless tobacco: Never Used  Substance and Sexual Activity  . Alcohol use: Yes    Alcohol/week: 0.0 standard drinks    Comment: socially  . Drug use: No  . Sexual activity: Not on file

## 2018-08-29 ENCOUNTER — Other Ambulatory Visit: Payer: Self-pay | Admitting: Internal Medicine

## 2018-09-04 ENCOUNTER — Other Ambulatory Visit: Payer: Self-pay | Admitting: Internal Medicine

## 2018-09-11 ENCOUNTER — Ambulatory Visit (INDEPENDENT_AMBULATORY_CARE_PROVIDER_SITE_OTHER): Payer: 59 | Admitting: Orthopaedic Surgery

## 2018-09-11 DIAGNOSIS — J101 Influenza due to other identified influenza virus with other respiratory manifestations: Secondary | ICD-10-CM | POA: Diagnosis not present

## 2018-09-19 ENCOUNTER — Ambulatory Visit (INDEPENDENT_AMBULATORY_CARE_PROVIDER_SITE_OTHER): Payer: Self-pay

## 2018-09-19 ENCOUNTER — Ambulatory Visit (INDEPENDENT_AMBULATORY_CARE_PROVIDER_SITE_OTHER): Payer: 59 | Admitting: Orthopaedic Surgery

## 2018-09-19 ENCOUNTER — Other Ambulatory Visit (INDEPENDENT_AMBULATORY_CARE_PROVIDER_SITE_OTHER): Payer: Self-pay

## 2018-09-19 ENCOUNTER — Encounter (INDEPENDENT_AMBULATORY_CARE_PROVIDER_SITE_OTHER): Payer: Self-pay | Admitting: Orthopaedic Surgery

## 2018-09-19 DIAGNOSIS — M25522 Pain in left elbow: Secondary | ICD-10-CM | POA: Diagnosis not present

## 2018-09-19 DIAGNOSIS — S42402A Unspecified fracture of lower end of left humerus, initial encounter for closed fracture: Secondary | ICD-10-CM

## 2018-09-19 NOTE — Progress Notes (Signed)
The patient is someone I am seeing in follow-up.  She injured her left elbow after a very hard mechanical fall directly on it a few weeks ago.  X-rays in the emergency room were read as negative.  On clinical exam she has significant pain around the radial head especially with pronation supination and she is feeling some popping in her elbow now just does not feel right to her at all.  On exam certainly the elbow is still quite tender and painful especially all around the radial head and lateral elbow itself.  There is some clicking in the elbow is well.  Plain films today are reviewed and compared to previous films and although it the radiologist read the previous films is normal there seems to be a deformity of the radial head.  This is seen on today's films as well.  At this point an MRI of her elbow is medically warranted clinically warranted based on her clinical exam findings with significant pain with rotation of the elbow combined with our plain film findings and the fact that I am feeling something mechanical and that is not right elbow with pronation supination.  She will still avoid any heavy impact activities with that elbow at all keep her out of any type of splint so she can work on motion of the elbow.  We will see her back after the MRI is obtained.  All question concerns were answered and addressed.

## 2018-09-22 DIAGNOSIS — B029 Zoster without complications: Secondary | ICD-10-CM | POA: Diagnosis not present

## 2018-09-26 NOTE — Progress Notes (Signed)
Subjective:   Patient ID: Jillian Mcknight, female    DOB: [birth year], 52 y.o.   MRN: [id number]  Jillian Mcknight is a pleasant 52 y.o. year old female who presents to clinic today with Annual Exam (Patient is here today for a CPE with PAP.  She is UTD with immunizations.  Last Mammogram on 2.25.19 WNL at [facility] and will need an order placed for this and her first BMD scan.  She has had some vaginal discharge.  She declines HIV and Hep-C lab draws.  FYI: She had the flu 11.25.19; Dx with Shingles 12.6.19 taking Valacyclovir 1k mg tid & Gaba hs; went to hospital 11.7.19 for fall and injury to left elbow.)  on 09/28/2018  HPI: Here to establish care and for CPX with pap smear.  Chart reviewed.  Mammogram 12/12/17. Colonoscopy due in 2027. No h/o abnormal pap smears in the last 5 years. No h/o post menopausal bleeding.  Anxiety/depression- taking 10- 20 mg Prozac daily. Denies any symptoms of anxiety or depression.  Allergic rhinitis-  Has been well controlled on Singulair daily.  Daughter has UC- she was diagnosed 4 years ago, she is now 54 years old.  Currently on Humira.  Sees pediatric GI specialist at [facility].  Depression screen [facility] 2/9 07/05/2015 05/15/2014 04/06/2013  Decreased Interest 0 0 0  Down, Depressed, Hopeless 0 0 0  PHQ - 2 Score 0 0 0    Health Maintenance  Topic Date Due  . Hepatitis C Screening  Aug 28, 1966  . HIV Screening  10/06/1979  . PAP SMEAR  05/20/2018  . MAMMOGRAM  12/13/2019  . COLONOSCOPY  11/27/2025  . TETANUS/TDAP  01/10/2028  . INFLUENZA VACCINE  Completed   Current Outpatient Medications on File Prior to Visit  Medication Sig Dispense Refill  . Ibuprofen-Famotidine 800-26.6 MG TABS One pill three times a day as needed 90 tablet 1  . montelukast (SINGULAIR) 10 MG tablet Take 1 tablet (10 mg total) by mouth daily. -- Office visit needed for further refills 90 tablet 0   No current facility-administered medications on file prior to visit.      Allergies  Allergen Reactions  . Sulfamethoxazole     Other reaction(s): GI Upset (intolerance)    Past Medical History:  Diagnosis Date  . Allergy   . Arthritis    knees  . Carpal tunnel syndrome of right wrist   . Depression   . Headache, frequent episodic tension-type     Past Surgical History:  Procedure Laterality Date  . CARPAL TUNNEL RELEASE Right 08/10/2016   Procedure: RIGHT CARPAL TUNNEL RELEASE;  Surgeon: Daryll Brod, MD;  Location: [facility];  Service: Orthopedics;  Laterality: Right;  FAB  . WISDOM TOOTH EXTRACTION  1982    Family History  Problem Relation Age of Onset  . Heart disease Mother   . Stroke Mother   . Lymphoma Mother   . Alzheimer's disease Father   . Cancer Cousin   . Colon cancer Cousin 58       colon  . Colon cancer Cousin 2       colon  . Diabetes Paternal Grandmother   . Colitis Daughter     Social History   Socioeconomic History  . Marital status: Married    Spouse name: Not on file  . Number of children: Not on file  . Years of education: Not on file  . Highest education level: Not on file  Occupational History  . Not  on file  Social Needs  . Financial resource strain: Not on file  . Food insecurity:    Worry: Not on file    Inability: Not on file  . Transportation needs:    Medical: Not on file    Non-medical: Not on file  Tobacco Use  . Smoking status: Never Smoker  . Smokeless tobacco: Never Used  Substance and Sexual Activity  . Alcohol use: Yes    Alcohol/week: 0.0 standard drinks    Comment: socially  . Drug use: No  . Sexual activity: Not on file  Lifestyle  . Physical activity:    Days per week: Not on file    Minutes per session: Not on file  . Stress: Not on file  Relationships  . Social connections:    Talks on phone: Not on file    Gets together: Not on file    Attends religious service: Not on file    Active member of club or organization: Not on file    Attends meetings of  clubs or organizations: Not on file    Relationship status: Not on file  . Intimate partner violence:    Fear of current or ex partner: Not on file    Emotionally abused: Not on file    Physically abused: Not on file    Forced sexual activity: Not on file  Other Topics Concern  . Not on file  Social History Narrative  . Not on file   The PMH, PSH, Social History, Family History, Medications, and allergies have been reviewed in [facility], and have been updated if relevant.   Review of Systems  Constitutional: Negative.   HENT: Negative.   Eyes: Negative.   Respiratory: Negative.   Cardiovascular: Negative.   Endocrine: Negative.   Genitourinary: Negative.   Musculoskeletal: Negative.   Skin: Negative.   Allergic/Immunologic: Negative.   Neurological: Negative.   Hematological: Negative.   Psychiatric/Behavioral: Negative.   All other systems reviewed and are negative.      Objective:    BP 126/82 (BP Location: Left Arm, Patient Position: Sitting, Cuff Size: Normal)   Pulse (!) 101   Temp 98.6 F (37 C) (Oral)   Ht '5\' 6"'$  (1.676 m)   Wt 177 lb 6.4 oz (80.5 kg)   LMP  (LMP Unknown)   SpO2 97%   BMI 28.63 kg/m    Physical Exam   General:  Well-developed,well-nourished,in no acute distress; alert,appropriate and cooperative throughout examination Head:  normocephalic and atraumatic.   Eyes:  vision grossly intact, PERRL Ears:  R ear normal and L ear normal externally, TMs clear bilaterally Nose:  no external deformity.   Mouth:  good dentition.   Neck:  No deformities, masses, or tenderness noted. Breasts:  No mass, nodules, thickening, tenderness, bulging, retraction, inflamation, nipple discharge or skin changes noted.   Lungs:  Normal respiratory effort, chest expands symmetrically. Lungs are clear to auscultation, no crackles or wheezes. Heart:  Normal rate and regular rhythm. S1 and S2 normal without gallop, murmur, click, rub or other extra sounds. Abdomen:   Bowel sounds positive,abdomen soft and non-tender without masses, organomegaly or hernias noted. Rectal:  no external abnormalities.   Genitalia:  Pelvic Exam:        External: normal female genitalia without lesions or masses        Vagina: normal without lesions or masses        Cervix: normal without lesions or masses  Adnexa: normal bimanual exam without masses or fullness        Uterus: normal by palpation        Pap smear: performed Msk:  No deformity or scoliosis noted of thoracic or lumbar spine.   Extremities:  No clubbing, cyanosis, edema, or deformity noted with normal full range of motion of all joints.   Neurologic:  alert & oriented X3 and gait normal.   Skin:  Intact without suspicious lesions or rashes Cervical Nodes:  No lymphadenopathy noted Axillary Nodes:  No palpable lymphadenopathy Psych:  Cognition and judgment appear intact. Alert and cooperative with normal attention span and concentration. No apparent delusions, illusions, hallucinations       Assessment & Plan:   Well woman exam with routine gynecological exam - Plan: Comp Met (CMET), CBC w/Diff, Lipid Profile, VITAMIN D 25 Hydroxy (Vit-D Deficiency, Fractures), Cytology - PAP( [facility])  Screening for lipoid disorders - Plan: Lipid Profile  Vaginal discharge - Plan: Cytology - PAP( [facility]) No follow-ups on file.

## 2018-09-28 ENCOUNTER — Encounter: Payer: Self-pay | Admitting: Family Medicine

## 2018-09-28 ENCOUNTER — Other Ambulatory Visit (HOSPITAL_COMMUNITY)
Admission: RE | Admit: 2018-09-28 | Discharge: 2018-09-28 | Disposition: A | Discharge status: Home / Self Care | Payer: 59 | Source: Ambulatory Visit | Attending: Family Medicine | Admitting: Family Medicine

## 2018-09-28 ENCOUNTER — Encounter

## 2018-09-28 ENCOUNTER — Ambulatory Visit (INDEPENDENT_AMBULATORY_CARE_PROVIDER_SITE_OTHER): Payer: 59 | Admitting: Family Medicine

## 2018-09-28 VITALS — BP 126/82 | HR 101 | Temp 98.6°F | Ht 66.0 in | Wt 177.4 lb

## 2018-09-28 DIAGNOSIS — Z01419 Encounter for gynecological examination (general) (routine) without abnormal findings: Secondary | ICD-10-CM

## 2018-09-28 DIAGNOSIS — Z1239 Encounter for other screening for malignant neoplasm of breast: Secondary | ICD-10-CM | POA: Diagnosis not present

## 2018-09-28 DIAGNOSIS — N898 Other specified noninflammatory disorders of vagina: Secondary | ICD-10-CM | POA: Insufficient documentation

## 2018-09-28 DIAGNOSIS — F32A Depression, unspecified: Secondary | ICD-10-CM

## 2018-09-28 DIAGNOSIS — T7840XA Allergy, unspecified, initial encounter: Secondary | ICD-10-CM | POA: Insufficient documentation

## 2018-09-28 DIAGNOSIS — E2839 Other primary ovarian failure: Secondary | ICD-10-CM

## 2018-09-28 DIAGNOSIS — F419 Anxiety disorder, unspecified: Secondary | ICD-10-CM

## 2018-09-28 DIAGNOSIS — F329 Major depressive disorder, single episode, unspecified: Secondary | ICD-10-CM

## 2018-09-28 DIAGNOSIS — F411 Generalized anxiety disorder: Secondary | ICD-10-CM | POA: Insufficient documentation

## 2018-09-28 DIAGNOSIS — Z13220 Encounter for screening for lipoid disorders: Secondary | ICD-10-CM | POA: Diagnosis not present

## 2018-09-28 DIAGNOSIS — T7840XD Allergy, unspecified, subsequent encounter: Secondary | ICD-10-CM

## 2018-09-28 LAB — CBC WITH DIFFERENTIAL/PLATELET
BASOS PCT: 0.6 % (ref 0.0–3.0)
Basophils Absolute: 0 10*3/uL (ref 0.0–0.1)
EOS ABS: 0.2 10*3/uL (ref 0.0–0.7)
EOS PCT: 3.9 % (ref 0.0–5.0)
HEMATOCRIT: 40.5 % (ref 36.0–46.0)
HEMOGLOBIN: 13.5 g/dL (ref 12.0–15.0)
LYMPHS ABS: 1.6 10*3/uL (ref 0.7–4.0)
Lymphocytes Relative: 33.2 % (ref 12.0–46.0)
MCHC: 33.4 g/dL (ref 30.0–36.0)
MCV: 91.2 fl (ref 78.0–100.0)
MONOS PCT: 9.7 % (ref 3.0–12.0)
Monocytes Absolute: 0.5 10*3/uL (ref 0.1–1.0)
Neutro Abs: 2.5 10*3/uL (ref 1.4–7.7)
Neutrophils Relative %: 52.6 % (ref 43.0–77.0)
PLATELETS: 291 10*3/uL (ref 150.0–400.0)
RBC: 4.44 Mil/uL (ref 3.87–5.11)
RDW: 13.6 % (ref 11.5–15.5)
WBC: 4.8 10*3/uL (ref 4.0–10.5)

## 2018-09-28 LAB — LIPID PANEL
Cholesterol: 221 mg/dL — ABNORMAL HIGH (ref 0–200)
HDL: 80.8 mg/dL (ref >39.00)
LDL CALC: 130 mg/dL — AB (ref 0–99)
NonHDL: 140.49
TRIGLYCERIDES: 50 mg/dL (ref 0.0–149.0)
Total CHOL/HDL Ratio: 3
VLDL: 10 mg/dL (ref 0.0–40.0)

## 2018-09-28 LAB — COMPREHENSIVE METABOLIC PANEL
ALT: 16 U/L (ref 0–35)
AST: 21 U/L (ref 0–37)
Albumin: 4.2 g/dL (ref 3.5–5.2)
Alkaline Phosphatase: 63 U/L (ref 39–117)
BUN: 11 mg/dL (ref 6–23)
CALCIUM: 9.4 mg/dL (ref 8.4–10.5)
CO2: 28 meq/L (ref 19–32)
Chloride: 105 mEq/L (ref 96–112)
Creatinine, Ser: 0.8 mg/dL (ref 0.40–1.20)
GFR: 79.45 mL/min (ref >60.00)
Glucose, Bld: 84 mg/dL (ref 70–99)
Potassium: 4.1 mEq/L (ref 3.5–5.1)
Sodium: 139 mEq/L (ref 135–145)
Total Bilirubin: 0.7 mg/dL (ref 0.2–1.2)
Total Protein: 6.9 g/dL (ref 6.0–8.3)

## 2018-09-28 LAB — VITAMIN D 25 HYDROXY (VIT D DEFICIENCY, FRACTURES): VITD: 42.89 ng/mL (ref 30.00–100.00)

## 2018-09-28 MED ORDER — FLUOXETINE HCL 10 MG PO TABS: 20.0000 mg | ORAL_TABLET | Freq: Every day | ORAL | 3 refills | Status: DC

## 2018-09-28 NOTE — Assessment & Plan Note (Signed)
Well controlled on current dose of prozac.  No changes made today.

## 2018-09-28 NOTE — Assessment & Plan Note (Signed)
Well controlled on current dose of Singulair.

## 2018-09-28 NOTE — Patient Instructions (Addendum)
Great to see you. I will call you with your lab results from today and you can view them online.   Please call the breast center at [phone] to schedule your mammogram  Okay to schedule your daughter a new patient.

## 2018-09-28 NOTE — Assessment & Plan Note (Addendum)
Reviewed preventive care protocols, scheduled due services, and updated immunizations Discussed nutrition, exercise, diet, and healthy lifestyle.  Pap smear done today.  Mammogram and DEXA ordered.

## 2018-10-02 ENCOUNTER — Telehealth (INDEPENDENT_AMBULATORY_CARE_PROVIDER_SITE_OTHER): Payer: Self-pay | Admitting: Orthopaedic Surgery

## 2018-10-02 ENCOUNTER — Ambulatory Visit: Payer: Self-pay | Admitting: *Deleted

## 2018-10-02 LAB — CYTOLOGY - PAP
BACTERIAL VAGINITIS: NEGATIVE
Candida vaginitis: NEGATIVE
DIAGNOSIS: NEGATIVE
HPV: NOT DETECTED

## 2018-10-02 NOTE — Telephone Encounter (Signed)
Called patient left message on voicemail to call back to schedule an appointment with Dr. Ninfa Linden for MRI review

## 2018-10-02 NOTE — Telephone Encounter (Signed)
Message from [facility] sent at 10/02/2018 5:27 PM EST   Summary: Pain question    Patient states she saw Dr. Deborra Medina last week in the office and she was diagnosed with shingles, she is taking ibuprofen for pain but it is not helping, is there anything else she could take for the pain? Please advise, best call back is [phone]

## 2018-10-03 ENCOUNTER — Ambulatory Visit (INDEPENDENT_AMBULATORY_CARE_PROVIDER_SITE_OTHER): Payer: 59 | Admitting: Orthopaedic Surgery

## 2018-10-03 MED ORDER — GABAPENTIN 300 MG PO CAPS: ORAL_CAPSULE | ORAL | 0 refills | Status: DC

## 2018-10-03 NOTE — Telephone Encounter (Signed)
Please advise 

## 2018-10-03 NOTE — Telephone Encounter (Signed)
It's strange because I do not see where I commented on a rash. Did we start her on valtrex?

## 2018-10-03 NOTE — Addendum Note (Signed)
Addended by: Marrion Coy on: 10/03/2018 01:52 PM   Modules accepted: Orders

## 2018-10-03 NOTE — Telephone Encounter (Signed)
Discussed with TA/she was Dx by an UC and given Valtrex/Per TA ok to give gabapentin 300mg  1qhs if needed can take it bid/tried to call pt but VM was full/sent to pharm/will send pt a MyChart message/thx dmf

## 2018-10-05 ENCOUNTER — Telehealth (INDEPENDENT_AMBULATORY_CARE_PROVIDER_SITE_OTHER): Payer: Self-pay | Admitting: Orthopaedic Surgery

## 2018-10-05 NOTE — Telephone Encounter (Signed)
FYI Spoke with patient she advised that she will need to call back and schedule her MRI review after she have it on 10/02/18 (Not sure of her schedule

## 2018-10-06 ENCOUNTER — Encounter: Payer: Self-pay | Admitting: Family Medicine

## 2018-10-12 ENCOUNTER — Ambulatory Visit
Admission: RE | Admit: 2018-10-12 | Discharge: 2018-10-12 | Disposition: A | Discharge status: Home / Self Care | Payer: 59 | Source: Ambulatory Visit | Attending: Orthopaedic Surgery | Admitting: Orthopaedic Surgery

## 2018-10-12 DIAGNOSIS — S52122A Displaced fracture of head of left radius, initial encounter for closed fracture: Secondary | ICD-10-CM | POA: Diagnosis not present

## 2018-10-12 DIAGNOSIS — S42402A Unspecified fracture of lower end of left humerus, initial encounter for closed fracture: Secondary | ICD-10-CM

## 2018-10-17 DIAGNOSIS — M25522 Pain in left elbow: Secondary | ICD-10-CM | POA: Diagnosis not present

## 2018-10-23 ENCOUNTER — Telehealth: Payer: Self-pay

## 2018-10-23 NOTE — Telephone Encounter (Signed)
Copied from [place] [id number]. Topic: General - Inquiry >> Oct 23, 2018  9:33 AM Lionel December wrote: Reason for CRM: Pt called stating at her visit with Dr Deborra Medina on 09/28/18 was told Dr Deborra Medina would also see her daughter as a new patient.  I am unable to schedule this appointment.

## 2018-10-25 NOTE — Telephone Encounter (Signed)
Please advise 

## 2018-10-25 NOTE — Telephone Encounter (Signed)
Okay to schedule

## 2018-10-31 ENCOUNTER — Other Ambulatory Visit: Payer: Self-pay | Admitting: Family Medicine

## 2018-11-01 DIAGNOSIS — S52109A Unspecified fracture of upper end of unspecified radius, initial encounter for closed fracture: Secondary | ICD-10-CM | POA: Insufficient documentation

## 2018-11-01 HISTORY — PX: ELBOW FRACTURE SURGERY: SHX616

## 2018-11-27 ENCOUNTER — Encounter: Payer: Self-pay | Admitting: Family Medicine

## 2018-12-13 ENCOUNTER — Ambulatory Visit
Admission: RE | Admit: 2018-12-13 | Discharge: 2018-12-13 | Disposition: A | Discharge status: Home / Self Care | Payer: Managed Care, Other (non HMO) | Source: Ambulatory Visit | Attending: Family Medicine | Admitting: Family Medicine

## 2018-12-13 DIAGNOSIS — E2839 Other primary ovarian failure: Secondary | ICD-10-CM

## 2018-12-13 DIAGNOSIS — Z1239 Encounter for other screening for malignant neoplasm of breast: Secondary | ICD-10-CM

## 2019-08-04 ENCOUNTER — Other Ambulatory Visit: Payer: Self-pay | Admitting: Family Medicine

## 2019-10-02 ENCOUNTER — Encounter: Payer: Managed Care, Other (non HMO) | Admitting: Family Medicine

## 2019-10-02 DIAGNOSIS — E785 Hyperlipidemia, unspecified: Secondary | ICD-10-CM | POA: Insufficient documentation

## 2019-10-02 DIAGNOSIS — Z8639 Personal history of other endocrine, nutritional and metabolic disease: Secondary | ICD-10-CM | POA: Insufficient documentation

## 2019-10-02 NOTE — Assessment & Plan Note (Signed)
Reviewed preventive care protocols, scheduled due services, and updated immunizations Discussed nutrition, exercise, diet, and healthy lifestyle.  

## 2019-10-02 NOTE — Progress Notes (Signed)
Virtual Visit via Video   Due to the COVID-19 pandemic, this visit was completed with telemedicine (audio/video) technology to reduce patient and provider exposure as well as to preserve personal protective equipment.   I connected with Jillian Mcknight by a video enabled telemedicine application and verified that I am speaking with the correct person using two identifiers. Location patient: Home Location provider: [facility], Office Persons participating in the virtual visit: Alajah Trease, Arnette Norris, MD   I discussed the limitations of evaluation and management by telemedicine and the availability of in person appointments. The patient expressed understanding and agreed to proceed.  Care Team   Patient Care Team: Lucille Passy, MD as PCP - General (Family Medicine)  Subjective:   HPI: Here for CPX.  Health Maintenance  Topic Date Due  . Hepatitis C Screening  1966-04-07  . HIV Screening  10/06/1979  . MAMMOGRAM  12/13/2020  . PAP SMEAR-Modifier  09/28/2021  . COLONOSCOPY  11/27/2025  . TETANUS/TDAP  01/10/2028  . INFLUENZA VACCINE  Completed     Patient established care with me last year on 09/28/18- note reviewed.  Had a CPX at that time.  Mammogram 12/13/18 DEXA 12/13/18- normal bone density Pap smear done by me on 09/28/18- normal Colonoscopy done by Dr. Loletha Carrow on 11/28/15- reviewed- recommended 10 year recall.  Fractured her elbow in 09/2018. Had surgery on it on 11/2018. Went through PT/OT-  Does not have full ROM or strength in that elbow but it is better.  She is continuing exercises with 5 pound weights.    Anxiety/depression- taking 10- 20 mg Prozac daily.  She alternates between 10 and 20 mg every other day. Denies any symptoms of anxiety or depression.  Depression screen [facility] 2/9 10/03/2019 07/05/2015 05/15/2014 04/06/2013  Decreased Interest 0 0 0 0  Down, Depressed, Hopeless 0 0 0 0  PHQ - 2 Score 0 0 0 0  Altered sleeping 0 - - -  Tired, decreased energy  0 - - -  Change in appetite 0 - - -  Feeling bad or failure about yourself  0 - - -  Trouble concentrating 0 - - -  Moving slowly or fidgety/restless 0 - - -  Suicidal thoughts 0 - - -  PHQ-9 Score 0 - - -  Difficult doing work/chores Not difficult at all - - -   No flowsheet data found.   HLD-  Lipid Panel     Component Value Date/Time   CHOL 221 (H) 09/28/2018 0819   TRIG 50.0 09/28/2018 0819   HDL 80.80 09/28/2018 0819   CHOLHDL 3 09/28/2018 0819   VLDL 10.0 09/28/2018 0819   LDLCALC 130 (H) 09/28/2018 0819   LDLDIRECT 124.7 04/06/2013 0933   The ASCVD Risk score (Goff DC Jr., et al., 2013) failed to calculate for the following reasons:   The systolic blood pressure is missing   Review of Systems  Constitutional: Negative.  Negative for fever and malaise/fatigue.  HENT: Negative.  Negative for congestion and hearing loss.   Eyes: Negative.  Negative for blurred vision, discharge and redness.  Respiratory: Negative.  Negative for cough and shortness of breath.   Cardiovascular: Negative.  Negative for chest pain, palpitations and leg swelling.  Gastrointestinal: Negative.  Negative for abdominal pain and heartburn.  Genitourinary: Negative.  Negative for dysuria.  Musculoskeletal: Positive for joint pain. Negative for falls.       Still does not have  FROM of elbow.  Skin: Negative.  Negative for rash.  Neurological: Positive for focal weakness. Negative for tingling, tremors, sensory change, speech change, seizures, loss of consciousness, weakness and headaches.  Endo/Heme/Allergies: Negative.  Does not bruise/bleed easily.  Psychiatric/Behavioral: Negative.  Negative for depression, hallucinations, memory loss, substance abuse and suicidal ideas. The patient is not nervous/anxious and does not have insomnia.   All other systems reviewed and are negative.    Patient Active Problem List   Diagnosis Date Noted  . History of hyperlipidemia 10/02/2019  . Anxiety and  depression 09/28/2018  . Allergies 09/28/2018  . Patellofemoral arthritis of right knee 05/30/2017  . Popliteal bursitis of left knee 12/29/2015  . Hypersomnia, idiopathic 12/27/2013  . 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 IBS (irritable bowel syndrome) 04/06/2013  . Snoring 04/06/2013  . Well woman exam without gynecological exam 05/15/2012    Social History   Tobacco Use  . Smoking status: Never Smoker  . Smokeless tobacco: Never Used  Substance Use Topics  . Alcohol use: Yes    Alcohol/week: 0.0 standard drinks    Comment: socially    Current Outpatient Medications:  .  FLUoxetine (PROZAC) 10 MG tablet, TAKE 2 TABLETS BY MOUTH  DAILY, Disp: 180 tablet, Rfl: 3 .  docusate sodium (COLACE) 100 MG capsule, 100 mg., Disp: , Rfl:   Allergies  Allergen Reactions  . Sulfamethoxazole     Other reaction(s): GI Upset (intolerance)    Objective:  Ht 5\' 6"  (1.676 m)   Wt 177 lb (80.3 kg)   LMP  (LMP Unknown)   BMI 28.57 kg/m  Wt Readings from Last 3 Encounters:  10/03/19 177 lb (80.3 kg)  09/28/18 177 lb 6.4 oz (80.5 kg)  08/24/18 179 lb (81.2 kg)    VITALS: Per patient if applicable, see vitals. GENERAL: Alert, appears well and in no acute distress. HEENT: Atraumatic, conjunctiva clear, no obvious abnormalities on inspection of external nose and ears. NECK: Normal movements of the head and neck. CARDIOPULMONARY: No increased WOB. Speaking in clear sentences. I:E ratio WNL.  MS: Moves all visible extremities without noticeable abnormality.- ROM of elbow slightly diminished. PSYCH: Pleasant and cooperative, well-groomed. Speech normal rate and rhythm. Affect is appropriate. Insight and judgement are appropriate. Attention is focused, linear, and appropriate.  NEURO: CN grossly intact. Oriented as arrived to appointment on time with no prompting. Moves both UE equally.  SKIN: No obvious lesions, wounds, erythema, or cyanosis noted on face or hands.  Depression screen [facility] 2/9  10/03/2019 07/05/2015 05/15/2014  Decreased Interest 0 0 0  Down, Depressed, Hopeless 0 0 0  PHQ - 2 Score 0 0 0  Altered sleeping 0 - -  Tired, decreased energy 0 - -  Change in appetite 0 - -  Feeling bad or failure about yourself  0 - -  Trouble concentrating 0 - -  Moving slowly or fidgety/restless 0 - -  Suicidal thoughts 0 - -  PHQ-9 Score 0 - -  Difficult doing work/chores Not difficult at all - -     . COVID-19 Education: The signs and symptoms of COVID-19 were discussed with the patient and how to seek care for testing if needed. The importance of social distancing was discussed today. . Reviewed expectations re: course of current medical issues. . Discussed self-management of symptoms. . Outlined signs and symptoms indicating need for more acute intervention. . Patient verbalized understanding and all questions were answered. Marland Kitchen Health Maintenance issues including appropriate healthy diet, exercise, and smoking avoidance were discussed with patient. Marland Kitchen  See orders for this visit as documented in the electronic medical record.  Arnette Norris, MD  Records requested if needed. Time spent: 40 minutes, of which >50% was spent in obtaining information about her symptoms, reviewing her previous labs, evaluations, and treatments, counseling her about her condition (please see the discussed topics above), and developing a plan to further investigate it; she had a number of questions which I addressed.   Lab Results  Component Value Date   WBC 4.8 09/28/2018   HGB 13.5 09/28/2018   HCT 40.5 09/28/2018   PLT 291.0 09/28/2018   GLUCOSE 84 09/28/2018   CHOL 221 (H) 09/28/2018   TRIG 50.0 09/28/2018   HDL 80.80 09/28/2018   LDLDIRECT 124.7 04/06/2013   LDLCALC 130 (H) 09/28/2018   ALT 16 09/28/2018   AST 21 09/28/2018   NA 139 09/28/2018   K 4.1 09/28/2018   CL 105 09/28/2018   CREATININE 0.80 09/28/2018   BUN 11 09/28/2018   CO2 28 09/28/2018   TSH 1.56 12/29/2015    Lab  Results  Component Value Date   TSH 1.56 12/29/2015   Lab Results  Component Value Date   WBC 4.8 09/28/2018   HGB 13.5 09/28/2018   HCT 40.5 09/28/2018   MCV 91.2 09/28/2018   PLT 291.0 09/28/2018   Lab Results  Component Value Date   NA 139 09/28/2018   K 4.1 09/28/2018   CO2 28 09/28/2018   GLUCOSE 84 09/28/2018   BUN 11 09/28/2018   CREATININE 0.80 09/28/2018   BILITOT 0.7 09/28/2018   ALKPHOS 63 09/28/2018   AST 21 09/28/2018   ALT 16 09/28/2018   PROT 6.9 09/28/2018   ALBUMIN 4.2 09/28/2018   CALCIUM 9.4 09/28/2018   GFR 79.45 09/28/2018   Lab Results  Component Value Date   CHOL 221 (H) 09/28/2018   Lab Results  Component Value Date   HDL 80.80 09/28/2018   Lab Results  Component Value Date   LDLCALC 130 (H) 09/28/2018   Lab Results  Component Value Date   TRIG 50.0 09/28/2018   Lab Results  Component Value Date   CHOLHDL 3 09/28/2018   No results found for: HGBA1C     Assessment & Plan:   Problem List Items Addressed This Visit      Active Problems   Well woman exam without gynecological exam - Primary    Reviewed preventive care protocols, scheduled due services, and updated immunizations Discussed nutrition, exercise, diet, and healthy lifestyle.       Anxiety and depression    Well controlled on current rxs. No changes made.   Video Visit from 10/03/2019 in [place]  PHQ-2 Total Score  0           History of hyperlipidemia    Encouraged heart healthy diet, increase exercise, avoid trans fats, consider a krill oil cap daily       Relevant Orders   Well woman- non DM- CBC   Well woman- non DM- CMET   Well woman- non DM- lipid   well woman- non DM- TSH    Other Visit Diagnoses    Need for hepatitis C screening test       Relevant Orders   Hepatitis C Antibody      I have discontinued Rever Hun's Ibuprofen-Famotidine, montelukast, and gabapentin. I am also having her maintain her FLUoxetine  and docusate sodium.  No orders of the defined types were placed in this encounter.  Arnette Norris, MD

## 2019-10-03 ENCOUNTER — Telehealth (INDEPENDENT_AMBULATORY_CARE_PROVIDER_SITE_OTHER): Payer: Managed Care, Other (non HMO) | Admitting: Family Medicine

## 2019-10-03 ENCOUNTER — Encounter: Payer: Self-pay | Admitting: Family Medicine

## 2019-10-03 VITALS — Ht 66.0 in | Wt 177.0 lb

## 2019-10-03 DIAGNOSIS — Z0000 Encounter for general adult medical examination without abnormal findings: Secondary | ICD-10-CM | POA: Diagnosis not present

## 2019-10-03 DIAGNOSIS — Z8639 Personal history of other endocrine, nutritional and metabolic disease: Secondary | ICD-10-CM

## 2019-10-03 DIAGNOSIS — F419 Anxiety disorder, unspecified: Secondary | ICD-10-CM | POA: Diagnosis not present

## 2019-10-03 DIAGNOSIS — F329 Major depressive disorder, single episode, unspecified: Secondary | ICD-10-CM

## 2019-10-03 DIAGNOSIS — M899 Disorder of bone, unspecified: Secondary | ICD-10-CM

## 2019-10-03 DIAGNOSIS — Z1159 Encounter for screening for other viral diseases: Secondary | ICD-10-CM | POA: Diagnosis not present

## 2019-10-03 NOTE — Telephone Encounter (Signed)
Pt has a lab on Friday and Dr. Deborra Medina wanted to see if pt can also have the pneumovax.  Can you put pt in a slot and Sharyn Lull will give pt injection?

## 2019-10-03 NOTE — Assessment & Plan Note (Addendum)
Well controlled on current rxs. No changes made.   Video Visit from 10/03/2019 in [facility]  PHQ-2 Total Score  0

## 2019-10-03 NOTE — Assessment & Plan Note (Signed)
Encouraged heart healthy diet, increase exercise, avoid trans fats, consider a krill oil cap daily 

## 2019-10-04 ENCOUNTER — Telehealth: Payer: Self-pay

## 2019-10-04 NOTE — Telephone Encounter (Signed)
.   Questions for Screening COVID-19    Travel or Contacts: No  During this illness, did/does the patient experience any of the following symptoms? Fever >100.80F []   Yes [x]   No []   Unknown Subjective fever (felt feverish) []   Yes [x]   No []   Unknown Chills []   Yes [x]   No []   Unknown Muscle aches (myalgia) []   Yes []   No [x]   Unknown Runny nose (rhinorrhea) []   Yes [x]   No []   Unknown Sore throat []   Yes [x]   No []   Unknown Cough (new onset or worsening of chronic cough) []   Yes [x]   No []   Unknown Shortness of breath (dyspnea) []   Yes [x]   No []   Unknown Nausea or vomiting []   Yes [x]   No []   Unknown Headache []   Yes [x]   No []   Unknown Abdominal pain  []   Yes [x]   No []   Unknown Diarrhea (?3 loose/looser than normal stools/24hr period) []   Yes [x]   No []   Unknown Other, specify:

## 2019-10-04 NOTE — Patient Instructions (Signed)
Health Maintenance Due  Topic Date Due  . Hepatitis C Screening  April 05, 1966  . HIV Screening  10/06/1979    Depression screen [facility] 2/9 10/03/2019 07/05/2015 05/15/2014  Decreased Interest 0 0 0  Down, Depressed, Hopeless 0 0 0  PHQ - 2 Score 0 0 0  Altered sleeping 0 - -  Tired, decreased energy 0 - -  Change in appetite 0 - -  Feeling bad or failure about yourself  0 - -  Trouble concentrating 0 - -  Moving slowly or fidgety/restless 0 - -  Suicidal thoughts 0 - -  PHQ-9 Score 0 - -  Difficult doing work/chores Not difficult at all - -

## 2019-10-04 NOTE — Telephone Encounter (Signed)
Please see message . Thank you .

## 2019-10-04 NOTE — Telephone Encounter (Signed)
Patient has been scheduled for nurse visit on 10/05/19 at 10:20 AM to receive Pneumovax-23 vaccine.

## 2019-10-05 ENCOUNTER — Ambulatory Visit (INDEPENDENT_AMBULATORY_CARE_PROVIDER_SITE_OTHER): Payer: Managed Care, Other (non HMO)

## 2019-10-05 ENCOUNTER — Other Ambulatory Visit (INDEPENDENT_AMBULATORY_CARE_PROVIDER_SITE_OTHER): Payer: Managed Care, Other (non HMO)

## 2019-10-05 ENCOUNTER — Other Ambulatory Visit: Payer: Self-pay

## 2019-10-05 DIAGNOSIS — Z8639 Personal history of other endocrine, nutritional and metabolic disease: Secondary | ICD-10-CM

## 2019-10-05 DIAGNOSIS — M899 Disorder of bone, unspecified: Secondary | ICD-10-CM

## 2019-10-05 DIAGNOSIS — Z1159 Encounter for screening for other viral diseases: Secondary | ICD-10-CM

## 2019-10-05 DIAGNOSIS — Z23 Encounter for immunization: Secondary | ICD-10-CM

## 2019-10-05 NOTE — Addendum Note (Signed)
Addended by: Lynnea Ferrier on: 10/05/2019 07:34 AM   Modules accepted: Orders

## 2019-10-05 NOTE — Progress Notes (Signed)
After obtaining consent, and per orders of Dr. Deborra Medina, injection of PNV-23 given in left deltoid by Ohm Dentler Berneta Sages. Patient instructed to remain in clinic for 20 minutes afterwards, and to report any adverse reaction to me immediately.

## 2019-10-06 LAB — CBC WITH DIFFERENTIAL/PLATELET
Basophils Absolute: 0 10*3/uL (ref 0.0–0.2)
Basos: 0 %
EOS (ABSOLUTE): 0.1 10*3/uL (ref 0.0–0.4)
Eos: 3 %
Hematocrit: 42.5 % (ref 34.0–46.6)
Hemoglobin: 14.3 g/dL (ref 11.1–15.9)
Immature Grans (Abs): 0 10*3/uL (ref 0.0–0.1)
Immature Granulocytes: 0 %
Lymphocytes Absolute: 1.4 10*3/uL (ref 0.7–3.1)
Lymphs: 33 %
MCH: 30.4 pg (ref 26.6–33.0)
MCHC: 33.6 g/dL (ref 31.5–35.7)
MCV: 90 fL (ref 79–97)
Monocytes Absolute: 0.4 10*3/uL (ref 0.1–0.9)
Monocytes: 10 %
Neutrophils Absolute: 2.4 10*3/uL (ref 1.4–7.0)
Neutrophils: 54 %
Platelets: 301 10*3/uL (ref 150–450)
RBC: 4.71 x10E6/uL (ref 3.77–5.28)
RDW: 12.6 % (ref 11.7–15.4)
WBC: 4.4 10*3/uL (ref 3.4–10.8)

## 2019-10-06 LAB — LIPID PANEL
Chol/HDL Ratio: 2.8 ratio (ref 0.0–4.4)
Cholesterol, Total: 271 mg/dL — ABNORMAL HIGH (ref 100–199)
HDL: 96 mg/dL (ref >39)
LDL Chol Calc (NIH): 169 mg/dL — ABNORMAL HIGH (ref 0–99)
Triglycerides: 44 mg/dL (ref 0–149)
VLDL Cholesterol Cal: 6 mg/dL (ref 5–40)

## 2019-10-06 LAB — COMPREHENSIVE METABOLIC PANEL
ALT: 15 IU/L (ref 0–32)
AST: 17 IU/L (ref 0–40)
Albumin/Globulin Ratio: 1.7 (ref 1.2–2.2)
Albumin: 4.4 g/dL (ref 3.8–4.9)
Alkaline Phosphatase: 64 IU/L (ref 39–117)
BUN/Creatinine Ratio: 17 (ref 9–23)
BUN: 12 mg/dL (ref 6–24)
Bilirubin Total: 0.5 mg/dL (ref 0.0–1.2)
CO2: 24 mmol/L (ref 20–29)
Calcium: 9.9 mg/dL (ref 8.7–10.2)
Chloride: 103 mmol/L (ref 96–106)
Creatinine, Ser: 0.7 mg/dL (ref 0.57–1.00)
GFR calc Af Amer: 114 mL/min/{1.73_m2} (ref >59)
GFR calc non Af Amer: 99 mL/min/{1.73_m2} (ref >59)
Globulin, Total: 2.6 g/dL (ref 1.5–4.5)
Glucose: 92 mg/dL (ref 65–99)
Potassium: 4.1 mmol/L (ref 3.5–5.2)
Sodium: 139 mmol/L (ref 134–144)
Total Protein: 7 g/dL (ref 6.0–8.5)

## 2019-10-06 LAB — HEPATITIS C ANTIBODY: Hep C Virus Ab: <0.1 s/co ratio (ref 0.0–0.9)

## 2019-10-06 LAB — TSH: TSH: 2.37 u[IU]/mL (ref 0.450–4.500)

## 2019-10-06 LAB — VITAMIN D 25 HYDROXY (VIT D DEFICIENCY, FRACTURES): Vit D, 25-Hydroxy: 40.3 ng/mL (ref 30.0–100.0)

## 2019-10-31 ENCOUNTER — Encounter: Payer: Self-pay | Admitting: Family Medicine

## 2019-11-12 ENCOUNTER — Encounter: Payer: Self-pay | Admitting: Family Medicine

## 2019-11-13 ENCOUNTER — Other Ambulatory Visit: Payer: Self-pay | Admitting: Family Medicine

## 2019-11-13 DIAGNOSIS — Z1231 Encounter for screening mammogram for malignant neoplasm of breast: Secondary | ICD-10-CM

## 2019-12-26 ENCOUNTER — Ambulatory Visit: Payer: Managed Care, Other (non HMO)

## 2020-01-17 ENCOUNTER — Other Ambulatory Visit: Payer: Self-pay

## 2020-01-17 ENCOUNTER — Ambulatory Visit
Admission: RE | Admit: 2020-01-17 | Discharge: 2020-01-17 | Disposition: A | Discharge status: Home / Self Care | Payer: Managed Care, Other (non HMO) | Source: Ambulatory Visit | Attending: Family Medicine | Admitting: Family Medicine

## 2020-01-17 DIAGNOSIS — Z1231 Encounter for screening mammogram for malignant neoplasm of breast: Secondary | ICD-10-CM

## 2020-01-21 ENCOUNTER — Other Ambulatory Visit: Payer: Self-pay | Admitting: Family Medicine

## 2020-01-21 ENCOUNTER — Other Ambulatory Visit: Payer: Self-pay | Admitting: *Deleted

## 2020-01-21 DIAGNOSIS — R928 Other abnormal and inconclusive findings on diagnostic imaging of breast: Secondary | ICD-10-CM

## 2020-01-24 ENCOUNTER — Encounter: Payer: Managed Care, Other (non HMO) | Admitting: Nurse Practitioner

## 2020-01-31 ENCOUNTER — Other Ambulatory Visit: Payer: Self-pay | Admitting: Family Medicine

## 2020-01-31 ENCOUNTER — Other Ambulatory Visit: Payer: Self-pay | Admitting: Obstetrics and Gynecology

## 2020-01-31 DIAGNOSIS — R928 Other abnormal and inconclusive findings on diagnostic imaging of breast: Secondary | ICD-10-CM

## 2020-02-05 ENCOUNTER — Ambulatory Visit: Payer: Managed Care, Other (non HMO)

## 2020-02-05 ENCOUNTER — Ambulatory Visit
Admission: RE | Admit: 2020-02-05 | Discharge: 2020-02-05 | Disposition: A | Discharge status: Home / Self Care | Payer: Managed Care, Other (non HMO) | Source: Ambulatory Visit | Attending: *Deleted | Admitting: *Deleted

## 2020-02-05 ENCOUNTER — Other Ambulatory Visit: Payer: Self-pay

## 2020-02-05 DIAGNOSIS — R928 Other abnormal and inconclusive findings on diagnostic imaging of breast: Secondary | ICD-10-CM

## 2020-02-21 ENCOUNTER — Ambulatory Visit: Payer: Managed Care, Other (non HMO) | Admitting: Family Medicine

## 2020-03-27 ENCOUNTER — Ambulatory Visit: Payer: Managed Care, Other (non HMO) | Admitting: Family Medicine

## 2020-04-08 ENCOUNTER — Encounter: Payer: Self-pay | Admitting: Family Medicine

## 2020-04-16 ENCOUNTER — Encounter: Payer: Self-pay | Admitting: Family Medicine

## 2020-05-06 ENCOUNTER — Ambulatory Visit: Payer: Managed Care, Other (non HMO) | Admitting: Family Medicine

## 2020-05-06 ENCOUNTER — Other Ambulatory Visit: Payer: Self-pay

## 2020-05-06 ENCOUNTER — Ambulatory Visit (INDEPENDENT_AMBULATORY_CARE_PROVIDER_SITE_OTHER): Payer: Managed Care, Other (non HMO)

## 2020-05-06 ENCOUNTER — Encounter: Payer: Self-pay | Admitting: Family Medicine

## 2020-05-06 VITALS — BP 112/82 | HR 76 | Ht 66.0 in | Wt 178.0 lb

## 2020-05-06 DIAGNOSIS — M25562 Pain in left knee: Secondary | ICD-10-CM | POA: Diagnosis not present

## 2020-05-06 DIAGNOSIS — G8929 Other chronic pain: Secondary | ICD-10-CM

## 2020-05-06 DIAGNOSIS — M1712 Unilateral primary osteoarthritis, left knee: Secondary | ICD-10-CM | POA: Diagnosis not present

## 2020-05-06 NOTE — Progress Notes (Signed)
[facility] [address] [place] Phone: [phone] Subjective:   I Kandace Blitz am serving as a Education administrator for Dr. Hulan Saas.  This visit occurred during the SARS-CoV-2 public health emergency.  Safety protocols were in place, including screening questions prior to the visit, additional usage of staff PPE, and extensive cleaning of exam room while observing appropriate contact time as indicated for disinfecting solutions.   I'm seeing this patient by the request  of:  Patient, No Pcp Per  CC: Knee pain follow-up  [id number]  Pinkie Manger is a 54 y.o. female coming in with complaint of left knee pain. Last seen 05/30/2017 for right knee pain. Patient states she goes into phases where she gets back to exercising. States she works from home and climbs stairs.  Onset- Chronic  Location - lateral  Character- stiffness, swelling, throbbing at night  Aggravating factors-walking, standing, sitting, going down stairs  Reliving factors-  Therapies tried- brace, ice, Ibuprofen, rest  Severity-  5/10 at its worse      Past Medical History:  Diagnosis Date  . Allergy   . Arthritis    knees  . Carpal tunnel syndrome of right wrist   . Depression   . Headache, frequent episodic tension-type    Past Surgical History:  Procedure Laterality Date  . CARPAL TUNNEL RELEASE Right 08/10/2016   Procedure: RIGHT CARPAL TUNNEL RELEASE;  Surgeon: Daryll Brod, MD;  Location: [place];  Service: Orthopedics;  Laterality: Right;  FAB  . ELBOW FRACTURE SURGERY Left 11/01/2018  . WISDOM TOOTH EXTRACTION  1982   Social History   Socioeconomic History  . Marital status: Married    Spouse name: Not on file  . Number of children: Not on file  . Years of education: Not on file  . Highest education level: Not on file  Occupational History  . Not on file  Tobacco Use  . Smoking status: Never Smoker  . Smokeless tobacco: Never Used   Vaping Use  . Vaping Use: Never used  Substance and Sexual Activity  . Alcohol use: Yes    Alcohol/week: 0.0 standard drinks    Comment: socially  . Drug use: No  . Sexual activity: Not on file  Other Topics Concern  . Not on file  Social History Narrative  . Not on file   Social Determinants of Health   Financial Resource Strain:   . Difficulty of Paying Living Expenses:   Food Insecurity:   . Worried About Charity fundraiser in the Last Year:   . Arboriculturist in the Last Year:   Transportation Needs:   . Film/video editor (Medical):   Marland Kitchen Lack of Transportation (Non-Medical):   Physical Activity:   . Days of Exercise per Week:   . Minutes of Exercise per Session:   Stress:   . Feeling of Stress :   Social Connections:   . Frequency of Communication with Friends and Family:   . Frequency of Social Gatherings with Friends and Family:   . Attends Religious Services:   . Active Member of Clubs or Organizations:   . Attends Archivist Meetings:   Marland Kitchen Marital Status:    Allergies  Allergen Reactions  . Sulfamethoxazole     Other reaction(s): GI Upset (intolerance)   Family History  Problem Relation Age of Onset  . Heart disease Mother   . Stroke Mother   . Lymphoma Mother   .  Alzheimer's disease Father   . Cancer Cousin   . Colon cancer Cousin 51       colon  . Colon cancer Cousin 4       colon  . Diabetes Paternal Grandmother   . Colitis Daughter   . Breast cancer Neg Hx          Current Outpatient Medications (Other):  .  docusate sodium (COLACE) 100 MG capsule, 100 mg. .  FLUoxetine (PROZAC) 10 MG tablet, TAKE 2 TABLETS BY MOUTH  DAILY   Reviewed prior external information including notes and imaging from  primary care provider As well as notes that were available from care everywhere and other healthcare systems.  Past medical history, social, surgical and family history all reviewed in electronic medical record.  No pertanent  information unless stated regarding to the chief complaint.   Review of Systems:  No headache, visual changes, nausea, vomiting, diarrhea, constipation, dizziness, abdominal pain, skin rash, fevers, chills, night sweats, weight loss, swollen lymph nodes, body aches, joint swelling, chest pain, shortness of breath, mood changes. POSITIVE muscle aches  Objective  Blood pressure 112/82, pulse 76, height 5\' 6"  (1.676 m), weight 178 lb (80.7 kg), SpO2 98 %.   General: No apparent distress alert and oriented x3 mood and affect normal, dressed appropriately.  HEENT: Pupils equal, extraocular movements intact  Respiratory: Patient's speak in full sentences and does not appear short of breath  Cardiovascular: No lower extremity edema, non tender, no erythema  Neuro: Cranial nerves II through XII are intact, neurovascularly intact in all extremities with 2+ DTRs and 2+ pulses.  Gait mild antalgic favoring the left knee Left knee exam strength the patient does have a positive patellar grind test.  Trace effusion noted.  Tender to palpation over the patellofemoral joint.  No significant instability of the knee and does have full range of motion.   After informed written and verbal consent, patient was seated on exam table. Left knee was prepped with alcohol swab and utilizing anterolateral approach, patient's left knee space was injected with 4:1  marcaine 0.5%: Kenalog 40mg /dL. Patient tolerated the procedure well without immediate complications.   Impression and Recommendations:    Patient was measured and fitted for an off-the-shelf brace. Adjustments were made to brace to ensure proper fit.   The above documentation has been reviewed and is accurate and complete Lyndal Pulley, DO       Note: This dictation was prepared with Dragon dictation along with smaller phrase technology. Any transcriptional errors that result from this process are unintentional.

## 2020-05-06 NOTE — Patient Instructions (Signed)
Exercise 3 times a week 2 times a day small amount  See me again in 5-6 weeks

## 2020-05-06 NOTE — Assessment & Plan Note (Signed)
Patient given injection.  Tolerated the procedure well.  Discussed icing regimen and home exercises.  Patient could be a candidate for viscosupplementation.  X-rays ordered today to further evaluate the progression over the last 6 years since the previous imaging.  Tru pull lite brace given today for instability.  Follow-up again in 4 to 8 weeks

## 2020-05-15 ENCOUNTER — Other Ambulatory Visit: Payer: Self-pay

## 2020-05-15 ENCOUNTER — Encounter: Payer: Self-pay | Admitting: Family Medicine

## 2020-05-15 ENCOUNTER — Ambulatory Visit: Payer: Managed Care, Other (non HMO) | Admitting: Family Medicine

## 2020-05-15 VITALS — BP 118/68 | HR 89 | Temp 97.8°F | Ht 66.0 in | Wt 178.3 lb

## 2020-05-15 DIAGNOSIS — E7800 Pure hypercholesterolemia, unspecified: Secondary | ICD-10-CM

## 2020-05-15 NOTE — Progress Notes (Signed)
Jillian Mcknight is a 54 y.o. female  Chief Complaint  Patient presents with   Transitions Of Care    toc from Dr. Deborra Medina, no concerns.     HPI: Jillian Mcknight is a 54 y.o. female seen today for [facility] appt, previous PCP Dr. Deborra Medina.  Last mammo: 01/2020  Last PAP: done with GYN 01/2020 - pt would prefer to have them every 1-2 years Last Dexa: 11/2018 - normal (T-score = -0.7) Last colonoscopy: 11/2015 (Dr. Loletha Carrow w/ [facility]) - due in 2027  She had wellness screening at work in 04/2020 ASCVD = 1.6%  Past Medical History:  Diagnosis Date   Allergy    Arthritis    knees   Carpal tunnel syndrome of right wrist    Depression    Headache, frequent episodic tension-type     Past Surgical History:  Procedure Laterality Date   CARPAL TUNNEL RELEASE Right 08/10/2016   Procedure: RIGHT CARPAL TUNNEL RELEASE;  Surgeon: Daryll Brod, MD;  Location: [place];  Service: Orthopedics;  Laterality: Right;  FAB   ELBOW FRACTURE SURGERY Left 11/01/2018   WISDOM TOOTH EXTRACTION  1982    Social History   Socioeconomic History   Marital status: Married    Spouse name: Not on file   Number of children: Not on file   Years of education: Not on file   Highest education level: Not on file  Occupational History   Not on file  Tobacco Use   Smoking status: Never Smoker   Smokeless tobacco: Never Used  Vaping Use   Vaping Use: Never used  Substance and Sexual Activity   Alcohol use: Yes    Alcohol/week: 0.0 standard drinks    Comment: socially   Drug use: No   Sexual activity: Not on file  Other Topics Concern   Not on file  Social History Narrative   Not on file   Social Determinants of Health   Financial Resource Strain:    Difficulty of Paying Living Expenses:   Food Insecurity:    Worried About Charity fundraiser in the Last Year:    Arboriculturist in the Last Year:   Transportation Needs:    Film/video editor (Medical):    Lack of  Transportation (Non-Medical):   Physical Activity:    Days of Exercise per Week:    Minutes of Exercise per Session:   Stress:    Feeling of Stress :   Social Connections:    Frequency of Communication with Friends and Family:    Frequency of Social Gatherings with Friends and Family:    Attends Religious Services:    Active Member of Clubs or Organizations:    Attends Music therapist:    Marital Status:   Intimate Partner Violence:    Fear of Current or Ex-Partner:    Emotionally Abused:    Physically Abused:    Sexually Abused:     Family History  Problem Relation Age of Onset   Heart disease Mother    Stroke Mother    Lymphoma Mother    Alzheimer's disease Father    Cancer Cousin    Colon cancer Cousin 29       colon   Colon cancer Cousin 45       colon   Diabetes Paternal Grandmother    Colitis Daughter    Breast cancer Neg Hx      Immunization History  Administered Date(s) Administered   Influenza  Inj Mdck Quad Pf 06/24/2018   Influenza Split 07/18/2013, 07/15/2014   Influenza,inj,Quad PF,6+ Mos 07/19/2015, 06/28/2017, 06/28/2019   Influenza-Unspecified 08/01/2016, 08/03/2018   PFIZER SARS-COV-2 Vaccination 12/17/2019, 01/18/2020   Pneumococcal Polysaccharide-23 10/05/2019   Tdap 01/23/2010, 05/15/2014, 01/09/2018    Outpatient Encounter Medications as of 05/15/2020  Medication Sig   FLUoxetine (PROZAC) 10 MG tablet TAKE 2 TABLETS BY MOUTH  DAILY   docusate sodium (COLACE) 100 MG capsule 100 mg.   No facility-administered encounter medications on file as of 05/15/2020.     ROS: Pertinent positives and negatives noted in HPI. Remainder of ROS non-contributory    Allergies  Allergen Reactions   Sulfamethoxazole-Trimethoprim     Other reaction(s): Abdominal Pain   Other    Sulfamethoxazole     Other reaction(s): GI Upset (intolerance)    BP 118/68    Pulse 89    Temp 97.8 F (36.6 C) (Tympanic)     Ht 5\' 6"  (1.676 m)    Wt 178 lb 4.8 oz (80.9 kg)    LMP  (LMP Unknown)    SpO2 96%    BMI 28.78 kg/m   Physical Exam Constitutional:      General: She is not in acute distress.    Appearance: Normal appearance. She is not toxic-appearing.  Pulmonary:     Effort: No respiratory distress.  Neurological:     Mental Status: She is alert and oriented to person, place, and time.  Psychiatric:        Mood and Affect: Mood normal.        Behavior: Behavior normal.      A/P:  1. Hypercholesterolemia - will scan results from wellness exam at work in 04/2020 - ASCVD 1.6% - cont with diet, exercise  - due for CPE in 09/2020   This visit occurred during the SARS-CoV-2 public health emergency.  Safety protocols were in place, including screening questions prior to the visit, additional usage of staff PPE, and extensive cleaning of exam room while observing appropriate contact time as indicated for disinfecting solutions.

## 2020-06-10 ENCOUNTER — Ambulatory Visit: Payer: Managed Care, Other (non HMO) | Admitting: Family Medicine

## 2020-06-25 ENCOUNTER — Telehealth: Payer: Self-pay

## 2020-06-25 MED ORDER — FLUOXETINE HCL 10 MG PO TABS: 20.0000 mg | ORAL_TABLET | Freq: Every day | ORAL | 3 refills | Status: DC

## 2020-06-25 NOTE — Telephone Encounter (Signed)
Dr C please advise.  Pt is asking for a refill on:  Fluoxetine 180 tabs   3-refills Last filled 08/06/19 Last OV-0729/21

## 2020-06-25 NOTE — Telephone Encounter (Signed)
Pt was notified and is aware.

## 2020-06-25 NOTE — Telephone Encounter (Signed)
Rx refilled.

## 2020-07-15 ENCOUNTER — Ambulatory Visit (INDEPENDENT_AMBULATORY_CARE_PROVIDER_SITE_OTHER): Payer: Managed Care, Other (non HMO) | Admitting: Family Medicine

## 2020-07-15 ENCOUNTER — Encounter: Payer: Self-pay | Admitting: Family Medicine

## 2020-07-15 DIAGNOSIS — M1711 Unilateral primary osteoarthritis, right knee: Secondary | ICD-10-CM

## 2020-07-15 NOTE — Progress Notes (Signed)
Virtual Visit via Video Note  I connected with Jillian Mcknight on 07/15/20 at  1:45 PM EDT by a video enabled telemedicine application and verified that I am speaking with the correct person using two identifiers.  Patient is in home setting and is alone Location: Patient: In home setting Provider: Office setting   I discussed the limitations of evaluation and management by telemedicine and the availability of in person appointments. The patient expressed understanding and agreed to proceed.  History of Present Illness: 7/201/2021 Patient given injection.  Tolerated the procedure well.  Discussed icing regimen and home exercises.  Patient could be a candidate for viscosupplementation.  X-rays ordered today to further evaluate the progression over the last 6 years since the previous imaging.  Tru pull lite brace given today for instability.  Follow-up again in 4 to 8 weeks  Update 07/15/2020 Speaking with patient regarding left knee pain.   Patient did have x-rays at last exam.  X-rays were taken May 07, 2020 and found to have no significant arthritic changes.   Observations/Objective: Alert, no difficulty with virtual platform so did meet on telephone call   Assessment and Plan:Patellofemoral arthritis of right knee Patient's right knee is doing much better at this time.  Very minimal arthritic changes on the x-rays.  Did respond to the injection and can do it again if necessary.  Follow-up as needed     Follow Up Instructions: As needed    I discussed the assessment and treatment plan with the patient. The patient was provided an opportunity to ask questions and all were answered. The patient agreed with the plan and demonstrated an understanding of the instructions.   The patient was advised to call back or seek an in-person evaluation if the symptoms worsen or if the condition fails to improve as anticipated.  I provided 5 minutes of non-face-to-face time during this  encounter.   Lyndal Pulley, DO

## 2020-07-15 NOTE — Assessment & Plan Note (Signed)
Patient's right knee is doing much better at this time.  Very minimal arthritic changes on the x-rays.  Did respond to the injection and can do it again if necessary.  Follow-up as needed

## 2020-09-22 ENCOUNTER — Ambulatory Visit: Payer: Self-pay

## 2020-09-22 ENCOUNTER — Other Ambulatory Visit: Payer: Self-pay

## 2020-09-22 ENCOUNTER — Ambulatory Visit: Payer: Managed Care, Other (non HMO) | Admitting: Family Medicine

## 2020-09-22 VITALS — BP 116/84 | HR 80 | Ht 66.0 in | Wt 177.6 lb

## 2020-09-22 DIAGNOSIS — G5621 Lesion of ulnar nerve, right upper limb: Secondary | ICD-10-CM | POA: Diagnosis not present

## 2020-09-22 DIAGNOSIS — G8929 Other chronic pain: Secondary | ICD-10-CM

## 2020-09-22 DIAGNOSIS — M25511 Pain in right shoulder: Secondary | ICD-10-CM

## 2020-09-22 DIAGNOSIS — M357 Hypermobility syndrome: Secondary | ICD-10-CM

## 2020-09-22 NOTE — Progress Notes (Signed)
I, Peterson Lombard, LAT, ATC acting as a scribe for Lynne Leader, MD.  Jillian Mcknight is a 54 y.o. female who presents to [place] [facility] today for R shoulder pain. Pt has previously been seen by Dr. Tamala Julian on 07/15/20 for R patellofemoral arthritis. Pt locates pain to be all over shoulder and complains pain goes down lateral upper arm and forearm and numbness in 4th-5th fingers  MOI: Pt has been doing a lot of yard work and cleaning out the attic. Pt reports pain has been ongoing for a few weeks. Radiates: yes UE numbness/tingling: yes Rx tried: ice, IBU, tylenol, heat, stretch   Pertinent review of systems: No fever or chills  Relevant historical information: Hypermobility   Exam:  BP 116/84 (BP Location: Right Arm, Patient Position: Sitting, Cuff Size: Normal)    Pulse 80    Ht 5\' 6"  (1.676 m)    Wt 177 lb 9.6 oz (80.6 kg)    LMP  (LMP Unknown)    SpO2 98%    BMI 28.67 kg/m  General: Well Developed, well nourished, and in no acute distress.   MSK: Right shoulder normal-appearing pain with overhead motion.  Intact strength.  Right elbow normal-appearing positive Tinel's cubital tunnel.    Lab and Radiology Results  Procedure: Real-time Ultrasound Guided Injection of right shoulder subacromial bursa Device: Philips Affiniti 50G Images permanently stored and available for review in PACS Ultrasound examination right shoulder reveals moderate subacromial bursitis.  Median nerve is slightly enlarged and cubital tunnel right elbow. Verbal informed consent obtained.  Discussed risks and benefits of procedure. Warned about infection bleeding damage to structures skin hypopigmentation and fat atrophy among others. Patient expresses understanding and agreement Time-out conducted.   Noted no overlying erythema, induration, or other signs of local infection.   Skin prepped in a sterile fashion.   Local anesthesia: Topical Ethyl chloride.   With sterile technique  and under real time ultrasound guidance:  40 mg of Kenalog and 2 L of Marcaine injected into subacromial bursa. Fluid seen entering the bursa.   Completed without difficulty   Pain immediately resolved suggesting accurate placement of the medication.   Advised to call if fevers/chills, erythema, induration, drainage, or persistent bleeding.   Images permanently stored and available for review in the ultrasound unit.  Impression: Technically successful ultrasound guided injection.          Assessment and Plan: 54 y.o. female with multifactorial arm pain.  Some pain due to subacromial bursitis.  Some pain due to cubital tunnel syndrome.  Plan for subacromial injection.  Also refer to physical therapy.  Recheck back in 6 to 8 weeks.  Return sooner if needed.   PDMP not reviewed this encounter. Orders Placed This Encounter  Procedures   [place] LIMITED JOINT SPACE STRUCTURES UP RIGHT(NO LINKED CHARGES)    Standing Status:   Future    Number of Occurrences:   1    Standing Expiration Date:   03/23/2021    Order Specific Question:   Reason for Exam (SYMPTOM  OR DIAGNOSIS REQUIRED)    Answer:   chronic right shoulder pain    Order Specific Question:   Preferred imaging location?    Answer:   [place]   DG Shoulder Right    Standing Status:   Future    Standing Expiration Date:   09/22/2021    Order Specific Question:   Reason for Exam (SYMPTOM  OR DIAGNOSIS REQUIRED)  Answer:   right shoulder pain    Order Specific Question:   Is patient pregnant?    Answer:   No    Order Specific Question:   Preferred imaging location?    Answer:   [facility]   Ambulatory referral to Physical Therapy    Referral Priority:   Routine    Referral Type:   Physical Medicine    Referral Reason:   Specialty Services Required    Requested Specialty:   Physical Therapy   No orders of the defined types were placed in this encounter.    Discussed warning signs or  symptoms. Please see discharge instructions. Patient expresses understanding.   The above documentation has been reviewed and is accurate and complete Lynne Leader, M.D.

## 2020-09-22 NOTE — Patient Instructions (Addendum)
Thank you for coming in today.  I've referred you to Physical Therapy.  Let us know if you don't hear from them in one week.  Complete your home exercises: View at [email or web address] using code: [place]  Call or go to the ER if you develop a large red swollen joint with extreme pain or oozing puss.   Recheck in 6-8 weeks.   Let us know if not improving.    Shoulder Impingement Syndrome  Shoulder impingement syndrome is a condition that causes pain when connective tissues (tendons) surrounding the shoulder joint become pinched. These tendons are part of the group of muscles and tissues that help to stabilize the shoulder (rotator cuff). Beneath the rotator cuff is a fluid-filled sac (bursa) that allows the muscles and tendons to glide smoothly. The bursa may become swollen or irritated (bursitis). Bursitis, swelling in the rotator cuff tendons, or both conditions can decrease how much space is under a bone in the shoulder joint (acromion), resulting in impingement. What are the causes? Shoulder impingement syndrome may be caused by bursitis or swelling of the rotator cuff tendons, which may result from:  Repetitive overhead arm movements.  Falling onto the shoulder.  Weakness in the shoulder muscles. What increases the risk? You may be more likely to develop this condition if you:  Play sports that involve throwing, such as baseball.  Participate in sports such as tennis, volleyball, and swimming.  Work as a Curator, Games developer, or Architect. Some people are also more likely to develop impingement syndrome because of the shape of their acromion bone. What are the signs or symptoms? The main symptom of this condition is pain on the front or side of the shoulder. The pain may:  Get worse when lifting or raising the arm.  Get worse at night.  Wake you up from sleeping.  Feel sharp when the shoulder is moved and then fade to an ache. Other symptoms may include:   Tenderness.  Stiffness.  Inability to raise the arm above shoulder level or behind the body.  Weakness. How is this diagnosed? This condition may be diagnosed based on:  Your symptoms and medical history.  A physical exam.  Imaging tests, such as: ? X-rays. ? MRI. ? Ultrasound. How is this treated? This condition may be treated by:  Resting your shoulder and avoiding all activities that cause pain or put stress on the shoulder.  Icing your shoulder.  NSAIDs to help reduce pain and swelling.  One or more injections of medicines to numb the area and reduce inflammation.  Physical therapy.  Surgery. This may be needed if nonsurgical treatments have not helped. Surgery may involve repairing the rotator cuff, reshaping the acromion, or removing the bursa. Follow these instructions at home: Managing pain, stiffness, and swelling   If directed, put ice on the injured area. ? Put ice in a plastic bag. ? Place a towel between your skin and the bag. ? Leave the ice on for 20 minutes, 2-3 times a day. Activity  Rest and return to your normal activities as told by your health care provider. Ask your health care provider what activities are safe for you.  Do exercises as told by your health care provider. General instructions  Do not use any products that contain nicotine or tobacco, such as cigarettes, e-cigarettes, and chewing tobacco. These can delay healing. If you need help quitting, ask your health care provider.  Ask your health care provider when it is safe for you to  drive.  Take over-the-counter and prescription medicines only as told by your health care provider.  Keep all follow-up visits as told by your health care provider. This is important. How is this prevented?  Give your body time to rest between periods of activity.  Be safe and responsible while being active. This will help you avoid falls.  Maintain physical fitness, including strength and  flexibility. Contact a health care provider if:  Your symptoms have not improved after 1-2 months of treatment and rest.  You cannot lift your arm away from your body. Summary  Shoulder impingement syndrome is a condition that causes pain when connective tissues (tendons) surrounding the shoulder joint become pinched.  The main symptom of this condition is pain on the front or side of the shoulder.  This condition is usually treated with rest, ice, and pain medicines as needed. This information is not intended to replace advice given to you by your health care provider. Make sure you discuss any questions you have with your health care provider. Document Revised: 01/26/2019 Document Reviewed: 03/29/2018 Elsevier Patient Education  2020 [employer].

## 2020-09-26 ENCOUNTER — Ambulatory Visit: Payer: Managed Care, Other (non HMO) | Attending: Family Medicine | Admitting: Physical Therapy

## 2020-09-26 ENCOUNTER — Other Ambulatory Visit: Payer: Self-pay

## 2020-09-26 ENCOUNTER — Encounter: Payer: Self-pay | Admitting: Physical Therapy

## 2020-09-26 DIAGNOSIS — M25511 Pain in right shoulder: Secondary | ICD-10-CM | POA: Insufficient documentation

## 2020-09-26 DIAGNOSIS — M25611 Stiffness of right shoulder, not elsewhere classified: Secondary | ICD-10-CM | POA: Diagnosis present

## 2020-09-26 DIAGNOSIS — R252 Cramp and spasm: Secondary | ICD-10-CM | POA: Diagnosis present

## 2020-09-26 NOTE — Therapy (Signed)
[place], [place], [address] Phone: [phone]   Fax:  [phone]  Physical Therapy Evaluation  Patient Details  Name: Jillian Mcknight MRN: [id number] Date of Birth: [birth year] No data recorded  Encounter Date: 09/26/2020   PT End of Session - 09/26/20 0836    Visit Number 1    Date for PT Re-Evaluation 11/27/20    Authorization Type cigna    PT Start Time 0750    PT Stop Time 0845    PT Time Calculation (min) 55 min    Activity Tolerance Patient tolerated treatment well    Behavior During Therapy [facility] for tasks assessed/performed           Past Medical History:  Diagnosis Date  . Allergy   . Arthritis    knees  . Carpal tunnel syndrome of right wrist   . Depression   . Headache, frequent episodic tension-type     Past Surgical History:  Procedure Laterality Date  . CARPAL TUNNEL RELEASE Right 08/10/2016   Procedure: RIGHT CARPAL TUNNEL RELEASE;  Surgeon: Daryll Brod, MD;  Location: [place];  Service: Orthopedics;  Laterality: Right;  FAB  . ELBOW FRACTURE SURGERY Left 11/01/2018  . WISDOM TOOTH EXTRACTION  1982    There were no vitals filed for this visit.    Subjective Assessment - 09/26/20 0757    Subjective Patient reports that in the past month she started having right shoulder pain and right lateral upper arm pain.  She does describe some pain and numbness in the 4th and 5th digits.  She does report doine a lot of yardwork and cleaning out the attic.  Had injection in the right shoulder on Monday and the pain is a little better.    Patient Stated Goals have better ROM and less pain    Currently in Pain? Yes    Pain Score 2     Pain Location Shoulder    Pain Orientation Right    Pain Descriptors / Indicators Aching    Pain Type Acute pain    Pain Radiating Towards inthe right lateral upper arm, numbness in the 4th and 5th fingers    Pain Onset More than a month  ago    Pain Frequency Constant    Aggravating Factors  as the day goes on it is worse, reaching behind, pain up to 6/10    Pain Relieving Factors anit inflammatories, limiting lifting pain can be 2/10    Effect of Pain on Daily Activities difficulty sleeping, hurts with most ADL's              [facility] PT Assessment - 09/26/20 0001      Assessment   Medical Diagnosis right shoulder pain (P)     Referring Provider (PT) Georgina Snell (P)     Onset Date/Surgical Date 08/27/20 (P)     Hand Dominance Right (P)     Prior Therapy n (P)       Precautions   Precautions None (P)       Balance Screen   Has the patient fallen in the past 6 months No (P)     Has the patient had a decrease in activity level because of a fear of falling?  No (P)     Is the patient reluctant to leave their home because of a fear of falling?  No (P)       Home Environment   Additional Comments does  housework and yardwork (P)       Prior Function   Level of Independence Independent (P)     Vocation Full time employment (P)     Vocation Requirements on computer most of the day (P)     Leisure limited exercises, some bike (P)       Posture/Postural Control   Posture Comments rounded shoulders , fwd head      ROM / Strength   AROM / PROM / Strength AROM;Strength (P)       AROM   AROM Assessment Site Shoulder (P)     Right/Left Shoulder Right (P)     Right Shoulder Flexion 150 Degrees (P)     Right Shoulder ABduction 170 Degrees (P)     Right Shoulder Internal Rotation 50 Degrees (P)     Right Shoulder External Rotation 70 Degrees (P)       Strength   Overall Strength Comments pain and weak 4-/5 for flexion, 4+/5 for other motions with a little pain (P)       Flexibility   Soft Tissue Assessment /Muscle Length -- (P)    tight ulnar nerve tension     Palpation   Palpation comment she has significant tightness with a trigger point in the right upper trap, tight and tender in the right cervical area has  referred pain in the right latreral upper arm, (P)                       Objective measurements completed on examination: See above findings.       [facility] Adult PT Treatment/Exercise - 09/26/20 0001      Modalities   Modalities Electrical Stimulation      Electrical Stimulation   Electrical Stimulation Location right upper trap into the shoulder    Electrical Stimulation Action IFC    Electrical Stimulation Parameters prone    Electrical Stimulation Goals Pain            Trigger Point Dry Needling - 09/26/20 0001    Consent Given? Yes    Education Handout Provided Yes    Muscles Treated Head and Neck Upper trapezius    Upper Trapezius Response Twitch reponse elicited;Palpable increased muscle length                  PT Short Term Goals - 09/26/20 0849      PT SHORT TERM GOAL #1   Title independent with initial HEP    Time 2    Period Weeks    Status New             PT Long Term Goals - 09/26/20 0849      PT LONG TERM GOAL #1   Title understand posture and body mechanics    Time 8    Period Weeks    Status New      PT LONG TERM GOAL #2   Title decrease pain 50%    Time 8    Period Weeks    Status New      PT LONG TERM GOAL #3   Title increase shoulder ROM to WNL's    Time 2    Period Weeks    Status New      PT LONG TERM GOAL #4   Title increase strength to 4+/5    Time 8    Period Weeks    Status New  Plan - 09/26/20 0837    Clinical Impression Statement Patient with right shoulder pain, over the past few months started after a lot of yardwork and cleaning hte attic.  She reports some numbness in the 4th and 5th fingers on the right.  She has some limitaitons in right shoulder motions with IR being the most limited, she reports that she is normally hyper mobile.  She has scapular weakness, she has significant tightness and spasm in the right upper trap, and neck area.  Some ulnar nerve tension.  Does  have rounded shoulder posture.    Stability/Clinical Decision Making Stable/Uncomplicated    Clinical Decision Making Low    Rehab Potential Good    PT Frequency 2x / week    PT Duration 8 weeks    PT Treatment/Interventions ADLs/Self Care Home Management;Patient/family education;Manual techniques;Dry needling;Electrical Stimulation;Moist Heat;Neuromuscular re-education;Therapeutic exercise;Therapeutic activities    PT Next Visit Plan see if the dry needles helped, I really stressed the posture at home and work    [employer] and Agree with Plan of Care Patient           Patient will benefit from skilled therapeutic intervention in order to improve the following deficits and impairments:  Decreased range of motion,Increased muscle spasms,Pain,Improper body mechanics,Postural dysfunction,Decreased strength  Visit Diagnosis: Acute pain of right shoulder - Plan: PT plan of care cert/re-cert  Cramp and spasm - Plan: PT plan of care cert/re-cert  Stiffness of right shoulder, not elsewhere classified - Plan: PT plan of care cert/re-cert     Problem List Patient Active Problem List   Diagnosis Date Noted  . Patellofemoral arthritis of left knee 05/06/2020  . History of hyperlipidemia 10/02/2019  . Anxiety and depression 09/28/2018  . Allergies 09/28/2018  . Patellofemoral arthritis of right knee 05/30/2017  . Popliteal bursitis of left knee 12/29/2015  . Hypersomnia, idiopathic 12/27/2013  . 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 IBS (irritable bowel syndrome) 04/06/2013  . Snoring 04/06/2013  . Well woman exam without gynecological exam 05/15/2012    Sumner Boast., PT 09/26/2020, 8:55 AM  [address] [place], [place], [address] Phone: [phone]   Fax:  [phone]  Name: Jillian Mcknight MRN: [id number] Date of Birth: 04-04-1966

## 2020-09-26 NOTE — Patient Instructions (Signed)
Access Code: [email or web address] URL: [email or web address] Date: 09/26/2020 Prepared by: Lum Babe  Exercises Scapular Retraction with Resistance - 1 x daily - 7 x weekly - 1 sets - 10 reps - 3 hold Scapular Retraction with Resistance Advanced - 1 x daily - 7 x weekly - 1 sets - 10 reps - 3 hold Shoulder External Rotation and Scapular Retraction with Resistance - 1 x daily - 7 x weekly - 1 sets - 10 reps - 3 hold Seated Shoulder Shrugs - 1 x daily - 7 x weekly - 1 sets - 10 reps - 3 hold Standing Ulnar Nerve Glide - 1 x daily - 7 x weekly - 1 sets - 10 reps - 15 hold   Trigger Point Dry Needling  . What is Trigger Point Dry Needling (DN)? o DN is a physical therapy technique used to treat muscle pain and dysfunction. Specifically, DN helps deactivate muscle trigger points (muscle knots).  o A thin filiform needle is used to penetrate the skin and stimulate the underlying trigger point. The goal is for a local twitch response (LTR) to occur and for the trigger point to relax. No medication of any kind is injected during the procedure.   . What Does Trigger Point Dry Needling Feel Like?  o The procedure feels different for each individual patient. Some patients report that they do not actually feel the needle enter the skin and overall the process is not painful. Very mild bleeding may occur. However, many patients feel a deep cramping in the muscle in which the needle was inserted. This is the local twitch response.   Marland Kitchen How Will I feel after the treatment? o Soreness is normal, and the onset of soreness may not occur for a few hours. Typically this soreness does not last longer than two days.  o Bruising is uncommon, however; ice can be used to decrease any possible bruising.  o In rare cases feeling tired or nauseous after the treatment is normal. In addition, your symptoms may get worse before they get better, this period will typically not last longer than 24 hours.   . What  Can I do After My Treatment? o Increase your hydration by drinking more water for the next 24 hours. o You may place ice or heat on the areas treated that have become sore, however, do not use heat on inflamed or bruised areas. Heat often brings more relief post needling. o You can continue your regular activities, but vigorous activity is not recommended initially after the treatment for 24 hours. o DN is best combined with other physical therapy such as strengthening, stretching, and other therapies.

## 2020-10-02 ENCOUNTER — Other Ambulatory Visit: Payer: Self-pay

## 2020-10-03 ENCOUNTER — Encounter: Payer: Self-pay | Admitting: Family Medicine

## 2020-10-03 ENCOUNTER — Ambulatory Visit: Payer: Managed Care, Other (non HMO) | Admitting: Physical Therapy

## 2020-10-03 ENCOUNTER — Ambulatory Visit (INDEPENDENT_AMBULATORY_CARE_PROVIDER_SITE_OTHER): Payer: Managed Care, Other (non HMO) | Admitting: Family Medicine

## 2020-10-03 VITALS — BP 118/76 | HR 83 | Temp 97.5°F | Ht 66.5 in | Wt 174.6 lb

## 2020-10-03 DIAGNOSIS — M25611 Stiffness of right shoulder, not elsewhere classified: Secondary | ICD-10-CM

## 2020-10-03 DIAGNOSIS — M25511 Pain in right shoulder: Secondary | ICD-10-CM

## 2020-10-03 DIAGNOSIS — Z1321 Encounter for screening for nutritional disorder: Secondary | ICD-10-CM | POA: Diagnosis not present

## 2020-10-03 DIAGNOSIS — R252 Cramp and spasm: Secondary | ICD-10-CM

## 2020-10-03 DIAGNOSIS — F419 Anxiety disorder, unspecified: Secondary | ICD-10-CM

## 2020-10-03 DIAGNOSIS — E7800 Pure hypercholesterolemia, unspecified: Secondary | ICD-10-CM

## 2020-10-03 DIAGNOSIS — Z0000 Encounter for general adult medical examination without abnormal findings: Secondary | ICD-10-CM | POA: Diagnosis not present

## 2020-10-03 DIAGNOSIS — Z0184 Encounter for antibody response examination: Secondary | ICD-10-CM

## 2020-10-03 DIAGNOSIS — F32A Depression, unspecified: Secondary | ICD-10-CM

## 2020-10-03 NOTE — Patient Instructions (Signed)
Health Maintenance, Female Adopting a healthy lifestyle and getting preventive care are important in promoting health and wellness. Ask your health care provider about:  The right schedule for you to have regular tests and exams.  Things you can do on your own to prevent diseases and keep yourself healthy. What should I know about diet, weight, and exercise? Eat a healthy diet   Eat a diet that includes plenty of vegetables, fruits, low-fat dairy products, and lean protein.  Do not eat a lot of foods that are high in solid fats, added sugars, or sodium. Maintain a healthy weight Body mass index (BMI) is used to identify weight problems. It estimates body fat based on height and weight. Your health care provider can help determine your BMI and help you achieve or maintain a healthy weight. Get regular exercise Get regular exercise. This is one of the most important things you can do for your health. Most adults should:  Exercise for at least 150 minutes each week. The exercise should increase your heart rate and make you sweat (moderate-intensity exercise).  Do strengthening exercises at least twice a week. This is in addition to the moderate-intensity exercise.  Spend less time sitting. Even light physical activity can be beneficial. Watch cholesterol and blood lipids Have your blood tested for lipids and cholesterol at 56 years of age, then have this test every 5 years. Have your cholesterol levels checked more often if:  Your lipid or cholesterol levels are high.  You are older than 56 years of age.  You are at high risk for heart disease. What should I know about cancer screening? Depending on your health history and family history, you may need to have cancer screening at various ages. This may include screening for:  Breast cancer.  Cervical cancer.  Colorectal cancer.  Skin cancer.  Lung cancer. What should I know about heart disease, diabetes, and high blood  pressure? Blood pressure and heart disease  High blood pressure causes heart disease and increases the risk of stroke. This is more likely to develop in people who have high blood pressure readings, are of African descent, or are overweight.  Have your blood pressure checked: ? Every 3-5 years if you are 18-39 years of age. ? Every year if you are 40 years old or older. Diabetes Have regular diabetes screenings. This checks your fasting blood sugar level. Have the screening done:  Once every three years after age 40 if you are at a normal weight and have a low risk for diabetes.  More often and at a younger age if you are overweight or have a high risk for diabetes. What should I know about preventing infection? Hepatitis B If you have a higher risk for hepatitis B, you should be screened for this virus. Talk with your health care provider to find out if you are at risk for hepatitis B infection. Hepatitis C Testing is recommended for:  Everyone born from 1945 through 1965.  Anyone with known risk factors for hepatitis C. Sexually transmitted infections (STIs)  Get screened for STIs, including gonorrhea and chlamydia, if: ? You are sexually active and are younger than 56 years of age. ? You are older than 56 years of age and your health care provider tells you that you are at risk for this type of infection. ? Your sexual activity has changed since you were last screened, and you are at increased risk for chlamydia or gonorrhea. Ask your health care provider if   you are at risk.  Ask your health care provider about whether you are at high risk for HIV. Your health care provider may recommend a prescription medicine to help prevent HIV infection. If you choose to take medicine to prevent HIV, you should first get tested for HIV. You should then be tested every 3 months for as long as you are taking the medicine. Pregnancy  If you are about to stop having your period (premenopausal) and  you may become pregnant, seek counseling before you get pregnant.  Take 400 to 800 micrograms (mcg) of folic acid every day if you become pregnant.  Ask for birth control (contraception) if you want to prevent pregnancy. Osteoporosis and menopause Osteoporosis is a disease in which the bones lose minerals and strength with aging. This can result in bone fractures. If you are 65 years old or older, or if you are at risk for osteoporosis and fractures, ask your health care provider if you should:  Be screened for bone loss.  Take a calcium or vitamin D supplement to lower your risk of fractures.  Be given hormone replacement therapy (HRT) to treat symptoms of menopause. Follow these instructions at home: Lifestyle  Do not use any products that contain nicotine or tobacco, such as cigarettes, e-cigarettes, and chewing tobacco. If you need help quitting, ask your health care provider.  Do not use street drugs.  Do not share needles.  Ask your health care provider for help if you need support or information about quitting drugs. Alcohol use  Do not drink alcohol if: ? Your health care provider tells you not to drink. ? You are pregnant, may be pregnant, or are planning to become pregnant.  If you drink alcohol: ? Limit how much you use to 0-1 drink a day. ? Limit intake if you are breastfeeding.  Be aware of how much alcohol is in your drink. In the [place] one drink equals one 12 oz bottle of beer (355 mL), one 5 oz glass of wine (148 mL), or one 1 oz glass of hard liquor (44 mL). General instructions  Schedule regular health, dental, and eye exams.  Stay current with your vaccines.  Tell your health care provider if: ? You often feel depressed. ? You have ever been abused or do not feel safe at home. Summary  Adopting a healthy lifestyle and getting preventive care are important in promoting health and wellness.  Follow your health care provider's instructions about healthy  diet, exercising, and getting tested or screened for diseases.  Follow your health care provider's instructions on monitoring your cholesterol and blood pressure. This information is not intended to replace advice given to you by your health care provider. Make sure you discuss any questions you have with your health care provider. Document Revised: 09/27/2018 Document Reviewed: 09/27/2018 Elsevier Patient Education  2020 [employer]vier Inc.  

## 2020-10-03 NOTE — Therapy (Addendum)
[place]. [place], [place], [address] Phone: [phone]   Fax:  [phone]  Physical Therapy Treatment  Patient Details  Name: Jillian Mcknight MRN: [id number] Date of Birth: 12-Feb-1966 No data recorded  Encounter Date: 10/03/2020   PT End of Session - 10/03/20 0950    Visit Number 2    Date for PT Re-Evaluation 11/27/20    PT Start Time 0924    PT Stop Time 1015    PT Time Calculation (min) 51 min           Past Medical History:  Diagnosis Date  . Allergy   . Arthritis    knees  . Carpal tunnel syndrome of right wrist   . Depression   . Headache, frequent episodic tension-type     Past Surgical History:  Procedure Laterality Date  . CARPAL TUNNEL RELEASE Right 08/10/2016   Procedure: RIGHT CARPAL TUNNEL RELEASE;  Surgeon: Daryll Brod, MD;  Location: [facility];  Service: Orthopedics;  Laterality: Right;  FAB  . ELBOW FRACTURE SURGERY Left 11/01/2018  . WISDOM TOOTH EXTRACTION  1982    There were no vitals filed for this visit.   Subjective Assessment - 10/03/20 0925    Subjective doig much better. shot really helped. HEP is good. minimal numbness. still know in RT UT- DN helped    Currently in Pain? Yes    Pain Score 2     Pain Location Shoulder                             [facility] Adult PT Treatment/Exercise - 10/03/20 0001      Exercises   Exercises Neck;Shoulder      Neck Exercises: Machines for Strengthening   UBE (Upper Arm Bike) L 2 3 fwd/3 back    Cybex Row 20# 2 sets 10    Lat Pull 20# 2 sets 10      Neck Exercises: Standing   Wall Push Ups 10 reps   with ball 5x CW 5x CCW   Other Standing Exercises 3 pt scab stab on wall 10 each on RT   issued as HEP with handout and band     Neck Exercises: Seated   Other Seated Exercise 3# 2 sets 10 row, shld abd and shld ext            Trigger Point Dry Needling - 10/03/20 0001    Consent Given? Yes     Upper Trapezius Response Twitch reponse elicited;Palpable increased muscle length                PT Education - 10/03/20 0930    Education Details reviewed use of home TENS    Person(s) Educated Patient    Methods Explanation    Comprehension Verbalized understanding            PT Short Term Goals - 10/03/20 0951      PT SHORT TERM GOAL #1   Title independent with initial HEP    Status Achieved             PT Long Term Goals - 09/26/20 0849      PT LONG TERM GOAL #1   Title understand posture and body mechanics    Time 8    Period Weeks    Status New      PT LONG TERM GOAL #2   Title decrease pain  50%    Time 8    Period Weeks    Status New      PT LONG TERM GOAL #3   Title increase shoulder ROM to WNL's    Time 2    Period Weeks    Status New      PT LONG TERM GOAL #4   Title increase strength to 4+/5    Time 8    Period Weeks    Status New                 Plan - 10/03/20 0951    Clinical Impression Statement STG met. Pt veb doing HEP. Added add'l scap stab and reviewed home TENS. Progressed strengthing with moderate cuing needed and scap weakness and compensation noted.    PT Treatment/Interventions ADLs/Self Care Home Management;Patient/family education;Manual techniques;Dry needling;Electrical Stimulation;Moist Heat;Neuromuscular re-education;Therapeutic exercise;Therapeutic activities    PT Next Visit Plan see if the dry needles helped, I really stressed the posture at home and work=- progress scap stab           Patient will benefit from skilled therapeutic intervention in order to improve the following deficits and impairments:  Decreased range of motion,Increased muscle spasms,Pain,Improper body mechanics,Postural dysfunction,Decreased strength  Visit Diagnosis: Acute pain of right shoulder  Cramp and spasm  Stiffness of right shoulder, not elsewhere classified     Problem List Patient Active Problem List   Diagnosis  Date Noted  . Patellofemoral arthritis of left knee 05/06/2020  . History of hyperlipidemia 10/02/2019  . Anxiety and depression 09/28/2018  . Allergies 09/28/2018  . Patellofemoral arthritis of right knee 05/30/2017  . Popliteal bursitis of left knee 12/29/2015  . Hypersomnia, idiopathic 12/27/2013  . 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 IBS (irritable bowel syndrome) 04/06/2013  . Snoring 04/06/2013  . Well woman exam without gynecological exam 05/15/2012    Sumner Boast PTA 10/03/2020, 10:18 AM  [place] [place]. [place], [place], [address] Phone: [phone]   Fax:  [phone]  Name: Jenniffer Vessels MRN: [id number] Date of Birth: 10-08-1966

## 2020-10-03 NOTE — Progress Notes (Signed)
Jillian Mcknight is a 54 y.o. female  Chief Complaint  Patient presents with  . Annual Exam    CP/labs. Fasting today.  Joint pains off/on.     HWE:XHBZJ Swing is a 54 y.o. female seen today for annual CPE, fasting labs.  She follows with GYN Dr. Radene Knee. She has a h/o arthritis and notes joints pains on/off. She follows with Drs. Eliezer Mccoy, last appt in 09/22/20. She is doing PT currently.   Last PAP: UTD 01/2020 Last mammo: 01/2020 Last colonoscopy: 11/2015 - Dr. Loletha Carrow - due in 11/2025  Dental: UTD - goes every 6 mo Vision: UTD on annual exam  Med refills needed today? none   Past Medical History:  Diagnosis Date  . Allergy   . Arthritis    knees  . Carpal tunnel syndrome of right wrist   . Depression   . Headache, frequent episodic tension-type     Past Surgical History:  Procedure Laterality Date  . CARPAL TUNNEL RELEASE Right 08/10/2016   Procedure: RIGHT CARPAL TUNNEL RELEASE;  Surgeon: Daryll Brod, MD;  Location: [facility];  Service: Orthopedics;  Laterality: Right;  FAB  . ELBOW FRACTURE SURGERY Left 11/01/2018  . WISDOM TOOTH EXTRACTION  1982    Social History   Socioeconomic History  . Marital status: Married    Spouse name: Not on file  . Number of children: Not on file  . Years of education: Not on file  . Highest education level: Not on file  Occupational History  . Not on file  Tobacco Use  . Smoking status: Never Smoker  . Smokeless tobacco: Never Used  Vaping Use  . Vaping Use: Never used  Substance and Sexual Activity  . Alcohol use: Yes    Alcohol/week: 0.0 standard drinks    Comment: socially  . Drug use: No  . Sexual activity: Yes  Other Topics Concern  . Not on file  Social History Narrative  . Not on file   Social Determinants of Health   Financial Resource Strain: Not on file  Food Insecurity: Not on file  Transportation Needs: Not on file  Physical Activity: Not on file  Stress: Not on file  Social  Connections: Not on file  Intimate Partner Violence: Not on file    Family History  Problem Relation Age of Onset  . Heart disease Mother   . Stroke Mother   . Lymphoma Mother   . Alzheimer's disease Father   . Cancer Cousin   . Colon cancer Cousin 57       colon  . Colon cancer Cousin 52       colon  . Diabetes Paternal Grandmother   . Colitis Daughter   . Breast cancer Neg Hx      Immunization History  Administered Date(s) Administered  . Influenza Inj Mdck Quad Pf 06/24/2018  . Influenza Split 07/18/2013, 07/15/2014  . Influenza,inj,Quad PF,6+ Mos 07/19/2015, 06/28/2017, 06/28/2019  . Influenza-Unspecified 08/01/2016, 08/03/2018, 07/13/2020  . PFIZER SARS-COV-2 Vaccination 12/17/2019, 01/18/2020, 08/08/2020  . Pneumococcal Polysaccharide-23 10/05/2019  . Tdap 01/23/2010, 05/15/2014, 01/09/2018    Outpatient Encounter Medications as of 10/03/2020  Medication Sig  . FLUoxetine (PROZAC) 10 MG tablet Take 2 tablets (20 mg total) by mouth daily.   No facility-administered encounter medications on file as of 10/03/2020.     ROS: Gen: no fever, chills  Skin: no rash, itching ENT: no ear pain, ear drainage, nasal congestion, rhinorrhea, sinus pressure, sore throat Eyes: no  blurry vision, double vision Resp: no cough, wheeze,SOB CV: no CP, palpitations, LE edema,  GI: no heartburn, n/v/d/c, abd pain GU: no dysuria, urgency, frequency, hematuria MSK: joint pains on/off - h/o arthritis Neuro: no dizziness, headache, weakness, vertigo Psych: no depression, anxiety, insomnia   Allergies  Allergen Reactions  . Sulfamethoxazole-Trimethoprim     Other reaction(s): Abdominal Pain  . Other   . Sulfamethoxazole     Other reaction(s): GI Upset (intolerance)    BP 118/76   Pulse 83   Temp (!) 97.5 F (36.4 C) (Temporal)   Ht 5' 6.5" (1.689 m)   Wt 174 lb 9.6 oz (79.2 kg)   LMP  (LMP Unknown)   SpO2 94%   BMI 27.76 kg/m   Physical Exam Constitutional:       General: She is not in acute distress.    Appearance: She is well-developed and well-nourished.  HENT:     Head: Normocephalic and atraumatic.     Right Ear: Tympanic membrane and ear canal normal.     Left Ear: Tympanic membrane and ear canal normal.     Nose: Nose normal.     Mouth/Throat:     Mouth: Oropharynx is clear and moist and mucous membranes are normal.  Eyes:     Conjunctiva/sclera: Conjunctivae normal.     Pupils: Pupils are equal, round, and reactive to light.  Neck:     Thyroid: No thyromegaly.  Cardiovascular:     Rate and Rhythm: Normal rate and regular rhythm.     Pulses: Intact distal pulses.     Heart sounds: Normal heart sounds. No murmur heard.   Pulmonary:     Effort: Pulmonary effort is normal. No respiratory distress.     Breath sounds: Normal breath sounds. No wheezing or rhonchi.  Abdominal:     General: Bowel sounds are normal. There is no distension.     Palpations: Abdomen is soft. There is no mass.     Tenderness: There is no abdominal tenderness.  Musculoskeletal:        General: No edema.     Cervical back: Neck supple.     Right lower leg: No edema.     Left lower leg: No edema.  Lymphadenopathy:     Cervical: No cervical adenopathy.  Skin:    General: Skin is warm and dry.  Neurological:     Mental Status: She is alert and oriented to person, place, and time.     Motor: No abnormal muscle tone.     Coordination: Coordination normal.  Psychiatric:        Mood and Affect: Mood and affect normal.        Behavior: Behavior normal.      A/P:  1. Annual physical exam - discussed importance of regular CV exercise, healthy diet, adequate sleep - UTD on PAP, mammo, colonoscopy - UTD on immunizations - dental and vision UTD - Comprehensive metabolic panel - CBC - Lipid panel - next CPE in 1 year  2. Hypercholesterolemia - not on medication - Lipid panel  3. Anxiety and depression - controlled on prozac 20mg  daily  4. Encounter  for vitamin deficiency screening - VITAMIN D 25 Hydroxy (Vit-D Deficiency, Fractures)   This visit occurred during the SARS-CoV-2 public health emergency.  Safety protocols were in place, including screening questions prior to the visit, additional usage of staff PPE, and extensive cleaning of exam room while observing appropriate contact time as indicated for disinfecting solutions.

## 2020-10-03 NOTE — Addendum Note (Signed)
Addended by: Lynnea Ferrier on: 10/03/2020 08:51 AM   Modules accepted: Orders

## 2020-10-06 LAB — LIPID PANEL
Chol/HDL Ratio: 3.1 ratio (ref 0.0–4.4)
Cholesterol, Total: 278 mg/dL — ABNORMAL HIGH (ref 100–199)
HDL: 90 mg/dL (ref >39)
LDL Chol Calc (NIH): 180 mg/dL — ABNORMAL HIGH (ref 0–99)
Triglycerides: 57 mg/dL (ref 0–149)
VLDL Cholesterol Cal: 8 mg/dL (ref 5–40)

## 2020-10-06 LAB — COMPREHENSIVE METABOLIC PANEL
ALT: 12 IU/L (ref 0–32)
AST: 13 IU/L (ref 0–40)
Albumin/Globulin Ratio: 2 (ref 1.2–2.2)
Albumin: 4.7 g/dL (ref 3.8–4.9)
Alkaline Phosphatase: 66 IU/L (ref 44–121)
BUN/Creatinine Ratio: 15 (ref 9–23)
BUN: 13 mg/dL (ref 6–24)
Bilirubin Total: 0.5 mg/dL (ref 0.0–1.2)
CO2: 20 mmol/L (ref 20–29)
Calcium: 9.9 mg/dL (ref 8.7–10.2)
Chloride: 99 mmol/L (ref 96–106)
Creatinine, Ser: 0.87 mg/dL (ref 0.57–1.00)
GFR calc Af Amer: 87 mL/min/{1.73_m2} (ref >59)
GFR calc non Af Amer: 75 mL/min/{1.73_m2} (ref >59)
Globulin, Total: 2.4 g/dL (ref 1.5–4.5)
Glucose: 85 mg/dL (ref 65–99)
Potassium: 4.4 mmol/L (ref 3.5–5.2)
Sodium: 139 mmol/L (ref 134–144)
Total Protein: 7.1 g/dL (ref 6.0–8.5)

## 2020-10-06 LAB — CBC
Hematocrit: 44.3 % (ref 34.0–46.6)
Hemoglobin: 14.9 g/dL (ref 11.1–15.9)
MCH: 30.5 pg (ref 26.6–33.0)
MCHC: 33.6 g/dL (ref 31.5–35.7)
MCV: 91 fL (ref 79–97)
Platelets: 320 10*3/uL (ref 150–450)
RBC: 4.89 x10E6/uL (ref 3.77–5.28)
RDW: 12.6 % (ref 11.7–15.4)
WBC: 6.2 10*3/uL (ref 3.4–10.8)

## 2020-10-06 LAB — VITAMIN D 25 HYDROXY (VIT D DEFICIENCY, FRACTURES): Vit D, 25-Hydroxy: 39.9 ng/mL (ref 30.0–100.0)

## 2020-10-08 ENCOUNTER — Encounter: Payer: Self-pay | Admitting: Family Medicine

## 2020-10-09 ENCOUNTER — Ambulatory Visit: Payer: Managed Care, Other (non HMO) | Admitting: Physical Therapy

## 2020-10-09 ENCOUNTER — Other Ambulatory Visit: Payer: Self-pay

## 2020-10-09 ENCOUNTER — Encounter: Payer: Self-pay | Admitting: Physical Therapy

## 2020-10-09 DIAGNOSIS — M25511 Pain in right shoulder: Secondary | ICD-10-CM | POA: Diagnosis not present

## 2020-10-09 DIAGNOSIS — M25611 Stiffness of right shoulder, not elsewhere classified: Secondary | ICD-10-CM

## 2020-10-09 DIAGNOSIS — R252 Cramp and spasm: Secondary | ICD-10-CM

## 2020-10-09 NOTE — Therapy (Signed)
[facility]. [place], [place], [address] Phone: [phone]   Fax:  [phone]  Physical Therapy Treatment  Patient Details  Name: Jillian Mcknight MRN: [id number] Date of Birth: 04-15-1966 No data recorded  Encounter Date: 10/09/2020   PT End of Session - 10/09/20 1056    Visit Number 3    Date for PT Re-Evaluation 11/27/20    Authorization Type cigna    PT Start Time 1013    PT Stop Time 1111    PT Time Calculation (min) 58 min    Activity Tolerance Patient tolerated treatment well    Behavior During Therapy [facility] for tasks assessed/performed           Past Medical History:  Diagnosis Date  . Allergy   . Arthritis    knees  . Carpal tunnel syndrome of right wrist   . Depression   . Headache, frequent episodic tension-type     Past Surgical History:  Procedure Laterality Date  . CARPAL TUNNEL RELEASE Right 08/10/2016   Procedure: RIGHT CARPAL TUNNEL RELEASE;  Surgeon: Daryll Brod, MD;  Location: [facility];  Service: Orthopedics;  Laterality: Right;  FAB  . ELBOW FRACTURE SURGERY Left 11/01/2018  . WISDOM TOOTH EXTRACTION  1982    There were no vitals filed for this visit.   Subjective Assessment - 10/09/20 1019    Subjective I was clenching my jaw when I woke up, some increased pain and tightness in the right upper trap, the right neck and the head and jaw    Currently in Pain? 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 but soemtimes I still clench                             [facility] Adult PT Treatment/Exercise - 10/09/20 0001      Neck Exercises: Machines for Strengthening   Nustep level 5 x 5 minutes    Cybex Row 20# 2 sets 10    Lat Pull 20# 2 sets 10      Neck Exercises: Theraband   Shoulder External Rotation 20  reps;Red      Neck Exercises: Standing   Other Standing Exercises 5# shrugs with uuper trap and levator stretches      Neck Exercises: Seated   Other Seated Exercise 3# 2 sets 10 row, shld abd and shld ext      Modalities   Modalities Electrical Stimulation      Electrical Stimulation   Electrical Stimulation Location right upper trap into the shoulder    Electrical Stimulation Action IFC    Electrical Stimulation Parameters supine    Electrical Stimulation Goals Pain      Manual Therapy   Manual Therapy Soft tissue mobilization    Soft tissue mobilization to the right upper trap and into the right cervical area, large knot                    PT Short Term Goals - 10/03/20 0951      PT SHORT TERM GOAL #1   Title independent with initial HEP    Status Achieved             PT  Long Term Goals - 10/09/20 1058      PT LONG TERM GOAL #1   Title understand posture and body mechanics    Status On-going      PT LONG TERM GOAL #2   Title decrease pain 50%    Status On-going      PT LONG TERM GOAL #3   Title increase shoulder ROM to WNL's    Status On-going                 Plan - 10/09/20 1057    Clinical Impression Statement Patient reports that she woke up grinding her teeth, she reports that this has caused increased right upper trap, neck and HA type pain, 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  She has a siginifcant knot in the right upper trap, some increased tightness and soreness in the right neck area.  She does need cues for the exercises    PT Next Visit Plan continue to work on the posture and strength, address the spasms as needed    Consulted and Agree with Plan of Care Patient           Patient will benefit from skilled therapeutic intervention in order to improve the following deficits and impairments:  Decreased range of motion,Increased muscle spasms,Pain,Improper body mechanics,Postural  dysfunction,Decreased strength  Visit Diagnosis: Acute pain of right shoulder  Cramp and spasm  Stiffness of right shoulder, not elsewhere classified     Problem List Patient Active Problem List   Diagnosis Date Noted  . Patellofemoral arthritis of left knee 05/06/2020  . History of hyperlipidemia 10/02/2019  . Anxiety and depression 09/28/2018  . Allergies 09/28/2018  . Patellofemoral arthritis of right knee 05/30/2017  . Popliteal bursitis of left knee 12/29/2015  . Hypersomnia, idiopathic 12/27/2013  . 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 IBS (irritable bowel syndrome) 04/06/2013  . Snoring 04/06/2013  . Well woman exam without gynecological exam 05/15/2012    Sumner Boast., PT 10/09/2020, 10:59 AM  [place]. [place], [place], [address] Phone: [phone]   Fax:  [phone]  Name: Jillian Mcknight MRN: [id number] Date of Birth: [birth year]

## 2020-10-21 ENCOUNTER — Ambulatory Visit: Payer: Managed Care, Other (non HMO) | Attending: Family Medicine | Admitting: Physical Therapy

## 2020-10-21 ENCOUNTER — Other Ambulatory Visit: Payer: Self-pay

## 2020-10-21 DIAGNOSIS — R252 Cramp and spasm: Secondary | ICD-10-CM | POA: Diagnosis not present

## 2020-10-21 NOTE — Therapy (Signed)
[place]. [place], [place], [address] Phone: [phone]   Fax:  [phone]  Physical Therapy Treatment  Patient Details  Name: Jillian Mcknight MRN: [id number] Date of Birth: December 31, 1965 No data recorded  Encounter Date: 10/21/2020   PT End of Session - 10/21/20 1218    Visit Number 4    Date for PT Re-Evaluation 11/27/20    PT Start Time 1152    PT Stop Time 6073    PT Time Calculation (min) 43 min           Past Medical History:  Diagnosis Date  . Allergy   . Arthritis    knees  . Carpal tunnel syndrome of right wrist   . Depression   . Headache, frequent episodic tension-type     Past Surgical History:  Procedure Laterality Date  . CARPAL TUNNEL RELEASE Right 08/10/2016   Procedure: RIGHT CARPAL TUNNEL RELEASE;  Surgeon: Daryll Brod, MD;  Location: [facility];  Service: Orthopedics;  Laterality: Right;  FAB  . ELBOW FRACTURE SURGERY Left 11/01/2018  . WISDOM TOOTH EXTRACTION  1982    There were no vitals filed for this visit.   Subjective Assessment - 10/21/20 1217    Subjective reason I am here is aboit 80 % better. just posterior radiating pain with abd and ER. HA and tightness has been an issue and bad today                             [facility] Adult PT Treatment/Exercise - 10/21/20 0001      Neck Exercises: Machines for Strengthening   UBE (Upper Arm Bike) L 3 3 fwd/3back      Neck Exercises: Standing   Neck Retraction 20 reps;3 secs   with ball     Modalities   Modalities Traction      Traction   Type of Traction Cervical    Max (lbs) 12    Time 15      Manual Therapy   Manual Therapy Manual Traction;Soft tissue mobilization    Soft tissue mobilization using tennis balls for suboccipital release    Manual Traction manual cerv retarction with abd and ER no pain and felt good                  PT Education - 10/21/20 1223    Education  Details added cerv retratcion, reviewed tband stab ex, educ on posture and maintanance    Person(s) Educated Patient    Methods Explanation;Demonstration;Verbal cues    Comprehension Verbalized understanding;Returned demonstration            PT Short Term Goals - 10/03/20 0951      PT SHORT TERM GOAL #1   Title independent with initial HEP    Status Achieved             PT Long Term Goals - 10/21/20 1221      PT LONG TERM GOAL #1   Title understand posture and body mechanics    Status Partially Met      PT LONG TERM GOAL #2   Title decrease pain 50%    Status Achieved      PT LONG TERM GOAL #3   Title increase shoulder ROM to WNL's    Baseline end range pain esp with abd and ER  but WFLs    Status Partially Met  PT LONG TERM GOAL #4   Title increase strength to 4+/5    Status Partially Met                 Plan - 10/21/20 1226    Clinical Impression Statement pt feeling much better for reason she came for. appears to be having cervical issues and was relieved with traction and suboccipital release. HEP for sub occ relase with tennis balls, educ on HEP for stab and added cerv retratcioan dn spent alot of time educ on posturual awarenss. progressing with goals.    PT Treatment/Interventions ADLs/Self Care Home Management;Patient/family education;Manual techniques;Dry needling;Electrical Stimulation;Moist Heat;Neuromuscular re-education;Therapeutic exercise;Therapeutic activities    PT Next Visit Plan MD 1/24- pt will scheudulePT in 10-14 days for reassessment after working HEP and posture at home           Patient will benefit from skilled therapeutic intervention in order to improve the following deficits and impairments:  Decreased range of motion,Increased muscle spasms,Pain,Improper body mechanics,Postural dysfunction,Decreased strength  Visit Diagnosis: Cramp and spasm     Problem List Patient Active Problem List   Diagnosis Date Noted  .  Patellofemoral arthritis of left knee 05/06/2020  . History of hyperlipidemia 10/02/2019  . Anxiety and depression 09/28/2018  . Allergies 09/28/2018  . Patellofemoral arthritis of right knee 05/30/2017  . Popliteal bursitis of left knee 12/29/2015  . Hypersomnia, idiopathic 12/27/2013  . 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 IBS (irritable bowel syndrome) 04/06/2013  . Snoring 04/06/2013  . Well woman exam without gynecological exam 05/15/2012    Doretha Goding,ANGIE PTA 10/21/2020, 12:33 PM  [place]. [place], [place], [address] Phone: [phone]   Fax:  [phone]  Name: Jillian Mcknight MRN: [id number] Date of Birth: 1966-04-21

## 2020-11-06 ENCOUNTER — Ambulatory Visit: Payer: Managed Care, Other (non HMO) | Admitting: Physical Therapy

## 2020-11-06 ENCOUNTER — Other Ambulatory Visit: Payer: Self-pay

## 2020-11-06 DIAGNOSIS — R252 Cramp and spasm: Secondary | ICD-10-CM

## 2020-11-06 NOTE — Therapy (Signed)
[place]. [place], [place], [address] Phone: [phone]   Fax:  [phone]  Physical Therapy Treatment  Patient Details  Name: Jillian Mcknight MRN: [id number] Date of Birth: 14-Jun-1966 No data recorded  Encounter Date: 11/06/2020   PT End of Session - 11/06/20 1226    Visit Number 5    Date for PT Re-Evaluation 11/27/20    PT Start Time 1443    PT Stop Time 1230    PT Time Calculation (min) 45 min           Past Medical History:  Diagnosis Date  . Allergy   . Arthritis    knees  . Carpal tunnel syndrome of right wrist   . Depression   . Headache, frequent episodic tension-type     Past Surgical History:  Procedure Laterality Date  . CARPAL TUNNEL RELEASE Right 08/10/2016   Procedure: RIGHT CARPAL TUNNEL RELEASE;  Surgeon: Daryll Brod, MD;  Location: [facility];  Service: Orthopedics;  Laterality: Right;  FAB  . ELBOW FRACTURE SURGERY Left 11/01/2018  . WISDOM TOOTH EXTRACTION  1982    There were no vitals filed for this visit.   Subjective Assessment - 11/06/20 1145    Subjective ordered traction from [facility] but did not work great, tennis balls help. working on [employer] and posture. lifted dog food and increased pain a couple days    Currently in Pain? Yes    Pain Score 3     Pain Location Neck                             [facility] Adult PT Treatment/Exercise - 11/06/20 0001      Traction   Type of Traction Cervical    Max (lbs) 15    Time 15      Manual Therapy   Manual Therapy Soft tissue mobilization;Passive ROM    Soft tissue mobilization cerv and thor    Passive ROM cerv and UE                  PT Education - 11/06/20 1219    Education Details reviewed postural ex and stretching. posture at desk, use of TENS and overall maintanance    Person(s) Educated Patient    Methods Explanation;Demonstration    Comprehension Verbalized  understanding;Returned demonstration            PT Short Term Goals - 10/03/20 0951      PT SHORT TERM GOAL #1   Title independent with initial HEP    Status Achieved             PT Long Term Goals - 11/06/20 1221      PT LONG TERM GOAL #1   Title understand posture and body mechanics    Baseline verb and demo just need to make a regular habit    Status Achieved      PT LONG TERM GOAL #2   Title decrease pain 50%    Status Achieved      PT LONG TERM GOAL #3   Title increase shoulder ROM to WNL's    Status Achieved      PT LONG TERM GOAL #4   Title increase strength to 4+/5    Status Achieved                 Plan - 11/06/20 1222    Clinical  Impression Statement goals met. shld pain has mostly resolved. pt is very tight in cerv/traps and thoracic with multi Trigger points. Reviewed HEP, stretches, posture esp with desk job. Educ on use of TENS and overall maintanance. Also discussed possible massage therapy.    PT Treatment/Interventions ADLs/Self Care Home Management;Patient/family education;Manual techniques;Dry needling;Electrical Stimulation;Moist Heat;Neuromuscular re-education;Therapeutic exercise;Therapeutic activities    PT Next Visit Plan HOLD PT 2-3 weeks and she will work at home. MD note sent with pt for 1/24 note           Patient will benefit from skilled therapeutic intervention in order to improve the following deficits and impairments:  Decreased range of motion,Increased muscle spasms,Pain,Improper body mechanics,Postural dysfunction,Decreased strength  Visit Diagnosis: Cramp and spasm     Problem List Patient Active Problem List   Diagnosis Date Noted  . Patellofemoral arthritis of left knee 05/06/2020  . History of hyperlipidemia 10/02/2019  . Anxiety and depression 09/28/2018  . Allergies 09/28/2018  . Patellofemoral arthritis of right knee 05/30/2017  . Popliteal bursitis of left knee 12/29/2015  . Hypersomnia, idiopathic  12/27/2013  . 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 IBS (irritable bowel syndrome) 04/06/2013  . Snoring 04/06/2013  . Well woman exam without gynecological exam 05/15/2012    PAYSEUR,ANGIE PTA 11/06/2020, 12:27 PM  [place]. [place], [place], [address] Phone: [phone]   Fax:  [phone]  Name: Leylanie Woodmansee MRN: [id number] Date of Birth: 04/10/1966

## 2020-11-07 NOTE — Progress Notes (Signed)
I, Peterson Lombard, LAT, ATC acting as a scribe for Lynne Leader, MD.  Jillian Mcknight is a 55 y.o. female who presents to [place] at [facility] today for f/u chronic R shoulder pain.  Pt was last seen by Dr. Georgina Snell on 09/22/20 and was referred to PT of which she's completed 5 visits. Today, pt reports R shoulder is feeling better. PT discussed w/ pt that she also has some neck and trap tightness. Pt notes she had much relief from the cervical traction treatment. Pt locates pain to R side of neck. Pt reports getting much relief from the prior steroid injection. Of note, pt is R hand dominate  Radiates: yes- only into upper arm UE numbness/tingling: no Rx tried: stretching, PT- traction  Pt also c/o L knee pain that's been ongoing off-and-on for 7-10 years. Pt locates pain to lateral aspect of L knee.  She notes popping and catching.  She has had x-ray and injection of the left knee July 2021.  Mechanical symptoms: popping and feeling "tight" Radiates: no Aggravates: knee flexion Rx tried: ice  Pertinent review of systems: No fevers or chills  Relevant historical information: IBS    Exam:  BP 126/84   Pulse 72   Ht 5' 6.5" (1.689 m)   Wt 176 lb 12.8 oz (80.2 kg)   LMP  (LMP Unknown)   SpO2 99%   BMI 28.11 kg/m  General: Well Developed, well nourished, and in no acute distress.   MSK: Right shoulder normal-appearing normal shoulder motion. Trapezius tender palpation right.  Left knee normal-appearing Normal motion. Tender palpation lateral joint line. Stable ligamentous exam. Positive lateral McMurray's test.    Lab and Radiology Results  EXAM: LEFT KNEE - COMPLETE 4+ VIEW  COMPARISON:  None.  FINDINGS: No evidence of fracture, dislocation, or joint effusion. No evidence of arthropathy or other focal bone abnormality. Soft tissues are unremarkable.  IMPRESSION: Negative.   Electronically Signed   By: Rolm Baptise M.D.   On: 05/07/2020  10:05  I, Lynne Leader, personally (independently) visualized and performed the interpretation of the images attached in this note.    Procedure: Real-time Ultrasound Guided Injection of left knee superior lateral patellar space Device: Philips Affiniti 50G Images permanently stored and available for review in PACS Verbal informed consent obtained.  Discussed risks and benefits of procedure. Warned about infection bleeding damage to structures skin hypopigmentation and fat atrophy among others. Patient expresses understanding and agreement Time-out conducted.   Noted no overlying erythema, induration, or other signs of local infection.   Skin prepped in a sterile fashion.   Local anesthesia: Topical Ethyl chloride.   With sterile technique and under real time ultrasound guidance:  40 mg of Kenalog and 2 mL of Marcaine injected into knee joint. Fluid seen entering the joint capsule.   Completed without difficulty   Pain immediately resolved suggesting accurate placement of the medication.   Advised to call if fevers/chills, erythema, induration, drainage, or persistent bleeding.   Images permanently stored and available for review in the ultrasound unit.  Impression: Technically successful ultrasound guided injection.     Assessment and Plan: 55 y.o. female with right shoulder pain much improved with physical therapy.  Patient still having some trapezius tightness and dysfunction.  Plan to revert to home exercise program.  Therapeutic massage should also be helpful.  Watchful waiting.  Left knee pain.  Exacerbation of chronic problem with new acute symptoms.  Pain thought to be due to  lateral meniscus degenerative tear.  Plan for injection, home exercise program working on quad strengthening, weight loss, and Voltaren gel.  If not improving consider repeat x-ray and MRI.  Recheck back as needed.   PDMP not reviewed this encounter. Orders Placed This Encounter  Procedures  . [place] LIMITED  JOINT SPACE STRUCTURES LOW LEFT(NO LINKED CHARGES)    Standing Status:   Future    Number of Occurrences:   1    Standing Expiration Date:   05/10/2021    Order Specific Question:   Reason for Exam (SYMPTOM  OR DIAGNOSIS REQUIRED)    Answer:   chronic left knee pain    Order Specific Question:   Preferred imaging location?    Answer:   [facility]   Meds ordered this encounter  Medications  . AMBULATORY NON FORMULARY MEDICATION    Sig: Therapeutic massage As needed for trap pain    Dispense:  1 each    Refill:  0     Discussed warning signs or symptoms. Please see discharge instructions. Patient expresses understanding.   The above documentation has been reviewed and is accurate and complete Lynne Leader, M.D.

## 2020-11-10 ENCOUNTER — Ambulatory Visit: Payer: Self-pay

## 2020-11-10 ENCOUNTER — Other Ambulatory Visit: Payer: Self-pay

## 2020-11-10 ENCOUNTER — Ambulatory Visit: Payer: Managed Care, Other (non HMO) | Admitting: Family Medicine

## 2020-11-10 VITALS — BP 126/84 | HR 72 | Ht 66.5 in | Wt 176.8 lb

## 2020-11-10 DIAGNOSIS — M25511 Pain in right shoulder: Secondary | ICD-10-CM | POA: Diagnosis not present

## 2020-11-10 DIAGNOSIS — M25562 Pain in left knee: Secondary | ICD-10-CM

## 2020-11-10 DIAGNOSIS — G8929 Other chronic pain: Secondary | ICD-10-CM

## 2020-11-10 MED ORDER — AMBULATORY NON FORMULARY MEDICATION: 0 refills | Status: DC

## 2020-11-10 NOTE — Patient Instructions (Addendum)
Thank you for coming in today.  Continue home exercises for the shoulder.   OK to do massage.   For the knee voltaren gel.   You received an injection today. Seek immediate medical attention if the joint becomes red, extremely painful, or is oozing fluid.  Work on the Astronomer taught to you by the AT: View at my-exercise-code.com using code: MBVX3A7  If not better we can do more testing.

## 2020-12-17 ENCOUNTER — Telehealth: Payer: Self-pay | Admitting: Family Medicine

## 2020-12-17 DIAGNOSIS — G8929 Other chronic pain: Secondary | ICD-10-CM

## 2020-12-17 NOTE — Telephone Encounter (Signed)
MRI ordered

## 2020-12-17 NOTE — Telephone Encounter (Signed)
Spoke w/ pt and informed her that the MRI has been ordered.

## 2020-12-17 NOTE — Telephone Encounter (Signed)
Patient called asking if Dr Georgina Snell could order the MRI for her left knee like they dicussed at her last visit. She said that she is still having continued issues and pain.  Please advise.

## 2020-12-25 ENCOUNTER — Other Ambulatory Visit: Payer: Managed Care, Other (non HMO)

## 2020-12-30 ENCOUNTER — Ambulatory Visit
Admission: RE | Admit: 2020-12-30 | Discharge: 2020-12-30 | Disposition: A | Discharge status: Home / Self Care | Payer: Managed Care, Other (non HMO) | Source: Ambulatory Visit | Attending: Family Medicine | Admitting: Family Medicine

## 2020-12-30 ENCOUNTER — Other Ambulatory Visit: Payer: Self-pay

## 2020-12-30 DIAGNOSIS — G8929 Other chronic pain: Secondary | ICD-10-CM

## 2020-12-31 ENCOUNTER — Telehealth: Payer: Self-pay | Admitting: Family Medicine

## 2020-12-31 ENCOUNTER — Ambulatory Visit: Payer: Managed Care, Other (non HMO) | Admitting: Family Medicine

## 2020-12-31 ENCOUNTER — Other Ambulatory Visit: Payer: Self-pay

## 2020-12-31 VITALS — BP 127/80 | HR 85 | Ht 66.5 in | Wt 179.0 lb

## 2020-12-31 DIAGNOSIS — S83512A Sprain of anterior cruciate ligament of left knee, initial encounter: Secondary | ICD-10-CM | POA: Insufficient documentation

## 2020-12-31 NOTE — Progress Notes (Signed)
I, Peterson Lombard, LAT, ATC acting as a scribe for Lynne Leader, MD.  Jillian Mcknight is a 55 y.o. female who presents to [place] at [facility] today for f/u chronic L knee pain and knee MRI review. Pt was last seen by Dr. Georgina Snell on 11/10/20 and was given a steroid injection and advised to work on quad strengthening, weight loss and Voltaren gel. Pt called the clinic on 12/17/20 requesting L knee MRI as she had cont to have pain. Today, pt reports L knee has been bothering her. Pt wants to exercise, but is unable because of knee pain.  She has feelings of instability.  L knee mechanical symptoms: popping, catching, and feeling "tight" Aggravates: knee flexion, going down the stairs Treatments tried: HEP, ice,   Dx imaging: 12/30/20 L knee MRI  05/06/20 L knee XR  Pertinent review of systems: No fevers or chills  Relevant historical information: Hypermobility syndrome.  She cannot recall a specific incident where she may have torn her ACL.   Exam:  BP 127/80 (BP Location: Right Arm, Patient Position: Sitting, Cuff Size: Normal)   Pulse 85   Ht 5' 6.5" (1.689 m)   Wt 179 lb (81.2 kg)   LMP  (LMP Unknown)   SpO2 98%   BMI 28.46 kg/m  General: Well Developed, well nourished, and in no acute distress.   MSK: Left knee normal motion normal gait.    Lab and Radiology Results No results found for this or any previous visit (from the past 72 hour(s)). MR Knee Left  Wo Contrast  Result Date: 12/31/2020 CLINICAL DATA:  Left knee pain for 20 years. EXAM: MRI OF THE LEFT KNEE WITHOUT CONTRAST TECHNIQUE: Multiplanar, multisequence MR imaging of the knee was performed. No intravenous contrast was administered. COMPARISON:  09/02/2014 FINDINGS: MENISCI Medial: Intact. Lateral: Intact. LIGAMENTS Cruciates: Complete chronic ACL tear. Intact PCL. Collaterals: Medial collateral ligament is intact. Lateral collateral ligament complex is intact. CARTILAGE Patellofemoral:  Partial-thickness cartilage loss of the inferior aspect of the lateral patellar facet. Medial: High-grade partial-thickness cartilage loss of the medial femorotibial compartment. Lateral: Partial thickness cartilage loss of the lateral femorotibial compartment. JOINT: Moderate joint effusion. Normal Hoffa's fat-pad. No plical thickening. POPLITEAL FOSSA: Popliteus tendon is intact. No Baker's cyst. Small multiloculated ganglion cyst arising from the medial gastrocnemius tendon origin. EXTENSOR MECHANISM: Intact quadriceps tendon. Intact patellar tendon. Intact lateral patellar retinaculum. Intact medial patellar retinaculum. Intact MPFL. BONES: No aggressive osseous lesion. No fracture or dislocation. Other: No fluid collection or hematoma. Muscles are normal. IMPRESSION: 1. Tricompartmental cartilage abnormalities as described above. 2. Complete chronic ACL tear. Electronically Signed   By: Kathreen Devoid   On: 12/31/2020 09:29    I, Lynne Leader, personally (independently) visualized and performed the interpretation of the images attached in this note.    Assessment and Plan: 55 y.o. female with left knee pain and instability.  [place] is in a bit of a pickle.  She has a chronic ACL tear and degenerative changes in her knee joint.  However the DJD is not bad enough that she clearly needs a knee replacement at this time.  Its not obvious that ACL reconstruction would fix her knee problems significantly.  ACL reconstruction would probably improve her knee instability but may not improve her knee pain very much.  Ultimately she will require knee replacement at some point.  It is hard to tell how much time she will have before she needs a knee replacement and if  that time is pretty soon after an ACL reconstruction it does not make ACL reconstruction worthwhile.  After discussion of her MRI findings and situation I do think it is worthwhile for her to have a discussion with orthopedic surgery.  She is  complaining of some instabilities symptoms and may benefit from ACL reconstruction.  Plan for referral to orthopedic surgery to discuss pros and cons of ACL reconstruction.   PDMP not reviewed this encounter. Orders Placed This Encounter  Procedures  . Ambulatory referral to Orthopedic Surgery    Referral Priority:   Routine    Referral Type:   Surgical    Referral Reason:   Specialty Services Required    Requested Specialty:   Orthopedic Surgery    Number of Visits Requested:   1   No orders of the defined types were placed in this encounter.    Discussed warning signs or symptoms. Please see discharge instructions. Patient expresses understanding.   The above documentation has been reviewed and is accurate and complete Lynne Leader, M.D.  Total encounter time 20 minutes including face-to-face time with the patient and, reviewing past medical record, and charting on the date of service.   MRI findings treatment plan and options

## 2020-12-31 NOTE — Progress Notes (Signed)
MRI of the knee shows arthritis changes.  This is described as partial thickness cartilage loss is in the knee.  Additionally there complete chronic ACL tear.  Ganglion cyst present at the medial gastrocnemius tendon origin.  Menisci are intact.  Recommend schedule follow-up appoint with me in the near future.

## 2020-12-31 NOTE — Patient Instructions (Signed)
Thank you for coming in today.  Talk with the surgeon  We can always do the gel shots if needed.   Keep me updated.

## 2020-12-31 NOTE — Telephone Encounter (Signed)
Pt has seen MRI results and has questions. Based on your schedule I was unsure if a MRI f/u appt or phone visit was needed or if this could be discussed via [place].

## 2020-12-31 NOTE — Telephone Encounter (Signed)
Appt made for today

## 2020-12-31 NOTE — Telephone Encounter (Signed)
Dr. Georgina Snell has one opening today and tomorrow at 3:15 pm if pt can do either of those.  He prefers in-office visits but does phone/virtual visits too.  Please check and see if pt can do either of those.

## 2021-01-06 ENCOUNTER — Encounter: Payer: Self-pay | Admitting: Family Medicine

## 2021-01-11 ENCOUNTER — Other Ambulatory Visit: Payer: Managed Care, Other (non HMO)

## 2021-01-14 ENCOUNTER — Telehealth: Payer: Self-pay

## 2021-01-14 ENCOUNTER — Encounter: Payer: Self-pay | Admitting: Orthopedic Surgery

## 2021-01-14 ENCOUNTER — Other Ambulatory Visit: Payer: Self-pay

## 2021-01-14 ENCOUNTER — Ambulatory Visit: Payer: Managed Care, Other (non HMO) | Admitting: Orthopedic Surgery

## 2021-01-14 DIAGNOSIS — M1712 Unilateral primary osteoarthritis, left knee: Secondary | ICD-10-CM | POA: Diagnosis not present

## 2021-01-14 DIAGNOSIS — S83512A Sprain of anterior cruciate ligament of left knee, initial encounter: Secondary | ICD-10-CM

## 2021-01-14 NOTE — Telephone Encounter (Signed)
Can we please get auth for left knee gel injection? Thanks.

## 2021-01-14 NOTE — Telephone Encounter (Signed)
Noted  

## 2021-01-14 NOTE — Progress Notes (Signed)
Office Visit Note   Patient: Jillian Mcknight           Date of Birth: Mar 01, 1966           MRN: [id number] Visit Date: 01/14/2021 Requested by: Gregor Hams, MD [place],  [place] [id number] PCP: Ronnald Nian, DO  Subjective: Chief Complaint  Patient presents with  . Left Knee - Pain    HPI: Jillian Mcknight is a 55 y.o. female who presents to the office complaining of left knee pain.  Patient reports on and off knee pain for several years that was worse since last July when she turned her foot and felt a burning sensation with a pop in her left knee.  She received a cortisone injection last summer with some relief of her pain.  Pain returned several months later and she received another cortisone injection that only provided several weeks of relief in January.  She complains of lateral sided left knee pain but denies any medial sided pain.  She has a lot of popping sensation with moving her knee and also endorses symptomatic instability that happened multiple times per day until she modified her activity to the point where now it only happens several times per week.  Her pain does not bother her all the time or even every day but it is a major limiter to how active she is.  She would like to return to walking, using stationary bike, lifting weights.  She has no history of previous surgery on the left knee but she does endorse multiple patellar dislocations bilaterally.  Denies any groin pain or radicular pain.  Denies any significant medical history.  Does not take any blood thinners.  She works from home for [employer] and uses a Counsellor.  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                ROS: All systems reviewed are negative as they relate to the chief complaint within the history of present illness.  Patient denies fevers or chills.  Assessment & Plan: Visit Diagnoses:  1.  Rupture of anterior cruciate ligament of left knee, initial encounter   2. Unilateral primary osteoarthritis, left knee     Plan: Patient is a 55 year old female who presents for evaluation of left knee pain.  Worse since last July when she sustained an injury to her knee and since then she endorses increased symptomatic instability of the knee.  This is improved as she has modified her behavior but it is keeping her from doing active things that she wants to do.  She also complains of lateral sided knee pain.  MRI scan revealed partial-thickness cartilage loss through all 3 compartments as well as chronic ACL tear.  Discussed options available to patient.  She has tried cortisone injections which have provided limited relief.  When looking on the MRI scan, she does have some wear of the cartilage but does not appear to be significant enough to warrant knee replacement in the near future.  Discussed ACL reconstruction as an option but discussed with patient that this would likely help with her complaints of instability but would not reliably improve her pain.  Patient is hesitant about any sort of surgery and she understands that if she were to proceed with surgery, she could endure a painful procedure and the intensive rehab that goes with it for a marginal  improvement in her symptoms.  After lengthy discussion, plan to preapproved patient for left knee gel injection given her tricompartmental cartilage loss and lack of sustained relief from cortisone injections.  Patient will consider her options regarding surgery and we will discuss further at her next appointment. This patient is diagnosed with osteoarthritis of the knee(s).    Radiographs show evidence of joint space narrowing, osteophytes, subchondral sclerosis and/or subchondral cysts.  This patient has knee pain which interferes with functional and activities of daily living.    This patient has experienced inadequate response, adverse effects  and/or intolerance with conservative treatments such as acetaminophen, NSAIDS, topical creams, physical therapy or regular exercise, knee bracing and/or weight loss.   This patient has experienced inadequate response or has a contraindication to intra articular steroid injections for at least 3 months.   This patient is not scheduled to have a total knee replacement within 6 months of starting treatment with viscosupplementation.   Follow-Up Instructions: No follow-ups on file.   Orders:  No orders of the defined types were placed in this encounter.  No orders of the defined types were placed in this encounter.     Procedures: No procedures performed   Clinical Data: No additional findings.  Objective: Vital Signs: LMP  (LMP Unknown)   Physical Exam:  Constitutional: Patient appears well-developed HEENT:  Head: Normocephalic Eyes:EOM are normal Neck: Normal range of motion Cardiovascular: Normal rate Pulmonary/chest: Effort normal Neurologic: Patient is alert Skin: Skin is warm Psychiatric: Patient has normal mood and affect  Ortho Exam: Ortho exam demonstrates left knee with no effusion.  Tenderness over the lateral joint line that patient rates as moderate.  No tenderness over the medial joint line.  Extends to 0 degrees and flexes greater than 90 degrees.  No calf tenderness.  Negative Homans' sign.  Positive Lachman exam with several millimeters of increased translation compared with the contralateral side but she does have an endpoint.  Able to perform straight leg raise.  No pain with hip range of motion.  No instability to varus/valgus stress.  No posterior lateral rotational instability.  Negative posterior drawer.  Specialty Comments:  No specialty comments available.  Imaging: No results found.   PMFS History: Patient Active Problem List   Diagnosis Date Noted  . Left anterior cruciate ligament tear 12/31/2020  . Patellofemoral arthritis of left knee  05/06/2020  . History of hyperlipidemia 10/02/2019  . Anxiety and depression 09/28/2018  . Allergies 09/28/2018  . Patellofemoral arthritis of right knee 05/30/2017  . Popliteal bursitis of left knee 12/29/2015  . Hypersomnia, idiopathic 12/27/2013  . 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 IBS (irritable bowel syndrome) 04/06/2013  . Snoring 04/06/2013  . Well woman exam without gynecological exam 05/15/2012   Past Medical History:  Diagnosis Date  . Allergy   . Arthritis    knees  . Carpal tunnel syndrome of right wrist   . Depression   . Headache, frequent episodic tension-type     Family History  Problem Relation Age of Onset  . Heart disease Mother   . Stroke Mother   . Lymphoma Mother   . Alzheimer's disease Father   . Cancer Cousin   . Colon cancer Cousin 44       colon  . Colon cancer Cousin 34       colon  . Diabetes Paternal Grandmother   . Colitis Daughter   . Breast cancer Neg Hx     Past Surgical History:  Procedure Laterality  Date  . CARPAL TUNNEL RELEASE Right 08/10/2016   Procedure: RIGHT CARPAL TUNNEL RELEASE;  Surgeon: Daryll Brod, MD;  Location: [facility];  Service: Orthopedics;  Laterality: Right;  FAB  . ELBOW FRACTURE SURGERY Left 11/01/2018  . [place] EXTRACTION  1982   Social History   Occupational History  . Not on file  Tobacco Use  . Smoking status: Never Smoker  . Smokeless tobacco: Never Used  Vaping Use  . Vaping Use: Never used  Substance and Sexual Activity  . Alcohol use: Yes    Alcohol/week: 0.0 standard drinks    Comment: socially  . Drug use: No  . Sexual activity: Yes

## 2021-01-21 ENCOUNTER — Other Ambulatory Visit: Payer: Self-pay | Admitting: Family Medicine

## 2021-01-21 DIAGNOSIS — Z1231 Encounter for screening mammogram for malignant neoplasm of breast: Secondary | ICD-10-CM

## 2021-01-23 ENCOUNTER — Other Ambulatory Visit: Payer: Self-pay

## 2021-01-23 ENCOUNTER — Encounter: Payer: Self-pay | Admitting: Physical Therapy

## 2021-01-23 ENCOUNTER — Ambulatory Visit: Payer: Managed Care, Other (non HMO) | Attending: Family Medicine | Admitting: Physical Therapy

## 2021-01-23 DIAGNOSIS — M25611 Stiffness of right shoulder, not elsewhere classified: Secondary | ICD-10-CM | POA: Diagnosis present

## 2021-01-23 DIAGNOSIS — M25511 Pain in right shoulder: Secondary | ICD-10-CM | POA: Diagnosis present

## 2021-01-23 DIAGNOSIS — R252 Cramp and spasm: Secondary | ICD-10-CM | POA: Diagnosis not present

## 2021-01-23 NOTE — Therapy (Signed)
[address]. [place], [place], [address] Phone: [phone]   Fax:  [phone]  Physical Therapy Treatment  Patient Details  Name: Jillian Mcknight MRN: [id number] Date of Birth: [birth year] Referring Provider (PT): Marikay Alar Date: 01/23/2021   PT End of Session - 01/23/21 0916    Visit Number 6    Date for PT Re-Evaluation 03/25/21    Authorization Type cigna    PT Start Time 0840    PT Stop Time 0930    PT Time Calculation (min) 50 min    Activity Tolerance Patient tolerated treatment well    Behavior During Therapy [facility] for tasks assessed/performed           Past Medical History:  Diagnosis Date  . Allergy   . Arthritis    knees  . Carpal tunnel syndrome of right wrist   . Depression   . Headache, frequent episodic tension-type     Past Surgical History:  Procedure Laterality Date  . CARPAL TUNNEL RELEASE Right 08/10/2016   Procedure: RIGHT CARPAL TUNNEL RELEASE;  Surgeon: Daryll Brod, MD;  Location: [facility];  Service: Orthopedics;  Laterality: Right;  FAB  . ELBOW FRACTURE SURGERY Left 11/01/2018  . [place]IS[place] TOOTH EXTRACTION  1982    There were no vitals filed for this visit.   Subjective Assessment - 01/23/21 0843    Subjective We have not seen patient since January, she reports that she is still very tight, she reports that when she does the exercises she does feel better.  She reports that she has been doing well with her posture and following the education that we provided, she reports that the tightness and spasms really got bad recently and felt the dry needling would help    Currently in Pain? Yes    Pain Score 4     Pain Location Neck   mostly in the right upper trap   Pain Orientation Right    Aggravating Factors  sitting at the computer              [facility] PT Assessment - 01/23/21 0001      Assessment   Medical Diagnosis right shoulder pain    Referring  Provider (PT) Georgina Snell      AROM   Right Shoulder Flexion 165 Degrees    Right Shoulder Internal Rotation 55 Degrees      Palpation   Palpation comment she has significant tightness with a trigger point in the right upper trap, tight and tender in the right cervical area has referred pain in the right latreral upper arm,                         [facility] Adult PT Treatment/Exercise - 01/23/21 0001      Traction   Type of Traction Cervical    Max (lbs) 14    Rest Time static    Time 12      Manual Therapy   Manual Therapy Soft tissue mobilization;Passive ROM    Soft tissue mobilization cerv and thor, right upper trap            Trigger Point Dry Needling - 01/23/21 0001    Consent Given? Yes    Muscles Treated Head and Neck Upper trapezius;Levator scapulae    Upper Trapezius Response Twitch reponse elicited;Palpable increased muscle length    Levator Scapulae Response Twitch response elicited;Palpable increased muscle length  PT Short Term Goals - 10/03/20 0951      PT SHORT TERM GOAL #1   Title independent with initial HEP    Status Achieved             PT Long Term Goals - 01/23/21 1610      PT LONG TERM GOAL #1   Title understand posture and body mechanics    Status Partially Met      PT LONG TERM GOAL #2   Title decrease pain 50%    Status Partially Met      PT LONG TERM GOAL #3   Title increase shoulder ROM to WNL's    Status Partially Met      PT LONG TERM GOAL #4   Title increase strength to 4+/5    Status Partially Met                 Plan - 01/23/21 0917    Clinical Impression Statement Patient has not been in to see [place] in a little over 2 months.  She reports that she was doing very well, she reports that she is still doing some of the exercises and still thinks about better posture, she reports recently diagnosed with torn ACL and that the stress has caused the pain in the neck, right shoulder and upper  arm to return, she reports that the right arm pain had really resolved until recently.  She comes back because the traction and the dry needles really helped her in the past.  She has improved ROM of the shoulder but continues to have significant trigger point and tightness in the right upper trap, and the neck area    PT Frequency 2x / week    PT Duration 8 weeks    PT Treatment/Interventions ADLs/Self Care Home Management;Patient/family education;Manual techniques;Dry needling;Electrical Stimulation;Moist Heat;Neuromuscular re-education;Therapeutic exercise;Therapeutic activities;Traction    PT Next Visit Plan performed re assessment and will work on the pain and tightness    Consulted and Agree with Plan of Care Patient           Patient will benefit from skilled therapeutic intervention in order to improve the following deficits and impairments:  Decreased range of motion,Increased muscle spasms,Pain,Improper body mechanics,Postural dysfunction,Decreased strength  Visit Diagnosis: Cramp and spasm - Plan: PT plan of care cert/re-cert  Acute pain of right shoulder - Plan: PT plan of care cert/re-cert  Stiffness of right shoulder, not elsewhere classified - Plan: PT plan of care cert/re-cert     Problem List Patient Active Problem List   Diagnosis Date Noted  . Left anterior cruciate ligament tear 12/31/2020  . Patellofemoral arthritis of left knee 05/06/2020  . History of hyperlipidemia 10/02/2019  . Anxiety and depression 09/28/2018  . Allergies 09/28/2018  . Patellofemoral arthritis of right knee 05/30/2017  . Popliteal bursitis of left knee 12/29/2015  . Hypersomnia, idiopathic 12/27/2013  . 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 IBS (irritable bowel syndrome) 04/06/2013  . Snoring 04/06/2013  . Well woman exam without gynecological exam 05/15/2012    Sumner Boast., PT 01/23/2021, 9:23 AM  [address]. [place], [place], [address] Phone: [phone]   Fax:  [phone]  Name: Jillian Mcknight MRN: [id number] Date of Birth: [birth year]

## 2021-01-30 ENCOUNTER — Encounter: Payer: Self-pay | Admitting: Family Medicine

## 2021-01-30 DIAGNOSIS — Z0184 Encounter for antibody response examination: Secondary | ICD-10-CM

## 2021-02-02 ENCOUNTER — Telehealth: Payer: Self-pay | Admitting: Family Medicine

## 2021-02-02 ENCOUNTER — Telehealth: Payer: Self-pay | Admitting: Orthopedic Surgery

## 2021-02-02 NOTE — Telephone Encounter (Signed)
Patient called stating that she would like to precede with the gel injections. Can we work on getting these approved and contact patient to schedule when ready?

## 2021-02-02 NOTE — Telephone Encounter (Signed)
Pt called asking we can the autho for the gel injection because she's going to go through her sports medicine Dr to get it.   [phone]

## 2021-02-02 NOTE — Telephone Encounter (Signed)
Please see message and advise.  Thank you. ° °

## 2021-02-03 NOTE — Telephone Encounter (Signed)
Have ran patient for Orthovisc of L knee. Awaiting reply from insurance

## 2021-02-06 ENCOUNTER — Encounter: Payer: Self-pay | Admitting: Family Medicine

## 2021-02-06 NOTE — Telephone Encounter (Signed)
[place] explained that this test can be done, but through [facility], due to [employer] cost.

## 2021-02-06 NOTE — Telephone Encounter (Signed)
Noted  

## 2021-02-11 NOTE — Telephone Encounter (Signed)
Please call pt to schedule lab appt

## 2021-02-11 NOTE — Telephone Encounter (Signed)
I spoke with pt and she will call back to schedule appt.

## 2021-02-16 ENCOUNTER — Ambulatory Visit (INDEPENDENT_AMBULATORY_CARE_PROVIDER_SITE_OTHER): Payer: Managed Care, Other (non HMO) | Admitting: Family Medicine

## 2021-02-16 ENCOUNTER — Ambulatory Visit: Payer: Self-pay

## 2021-02-16 ENCOUNTER — Other Ambulatory Visit: Payer: Self-pay

## 2021-02-16 DIAGNOSIS — G8929 Other chronic pain: Secondary | ICD-10-CM | POA: Diagnosis not present

## 2021-02-16 DIAGNOSIS — M25562 Pain in left knee: Secondary | ICD-10-CM | POA: Diagnosis not present

## 2021-02-16 NOTE — Progress Notes (Signed)
Jillian Mcknight presents to clinic today for Orthovisc injection left knee 1/3  Procedure: Real-time Ultrasound Guided Injection of left knee superior lateral patellar space Device: Philips Affiniti 50G Images permanently stored and available for review in PACS Verbal informed consent obtained.  Discussed risks and benefits of procedure. Warned about infection bleeding damage to structures skin hypopigmentation and fat atrophy among others. Patient expresses understanding and agreement Time-out conducted.   Noted no overlying erythema, induration, or other signs of local infection.   Skin prepped in a sterile fashion.   Local anesthesia: Topical Ethyl chloride.   With sterile technique and under real time ultrasound guidance:  Orthovisc 30mg  injected into left knee joint. Fluid seen entering the joint capsule.   Completed without difficulty   Advised to call if fevers/chills, erythema, induration, drainage, or persistent bleeding.   Images permanently stored and available for review in the ultrasound unit.  Impression: Technically successful ultrasound guided injection.   Lot number: [id number]  Return in 1 week for Orthovisc injection 2/3

## 2021-02-16 NOTE — Patient Instructions (Addendum)
You had a L knee Orthovisc injection today.  Call or go to the ER if you develop a large red swollen joint with extreme pain or oozing puss.   Schedule your next injection for 1 week.

## 2021-02-23 ENCOUNTER — Telehealth: Payer: Self-pay

## 2021-02-23 ENCOUNTER — Ambulatory Visit: Payer: Self-pay

## 2021-02-23 ENCOUNTER — Ambulatory Visit (INDEPENDENT_AMBULATORY_CARE_PROVIDER_SITE_OTHER): Payer: Managed Care, Other (non HMO) | Admitting: Family Medicine

## 2021-02-23 ENCOUNTER — Other Ambulatory Visit: Payer: Self-pay

## 2021-02-23 DIAGNOSIS — G8929 Other chronic pain: Secondary | ICD-10-CM

## 2021-02-23 DIAGNOSIS — M25562 Pain in left knee: Secondary | ICD-10-CM

## 2021-02-23 NOTE — Progress Notes (Signed)
Shakya presents to clinic today for Orthovisc injection left knee 2/4  Procedure: Real-time Ultrasound Guided Injection of left knee superior lateral patellar space Device: Philips Affiniti 50G Images permanently stored and available for review in PACS Verbal informed consent obtained.  Discussed risks and benefits of procedure. Warned about infection bleeding damage to structures skin hypopigmentation and fat atrophy among others. Patient expresses understanding and agreement Time-out conducted.   Noted no overlying erythema, induration, or other signs of local infection.   Skin prepped in a sterile fashion.   Local anesthesia: Topical Ethyl chloride.   With sterile technique and under real time ultrasound guidance:  Orthovisc 30 mg injected into knee joint. Fluid seen entering the joint capsule.   Completed without difficulty     Advised to call if fevers/chills, erythema, induration, drainage, or persistent bleeding.   Images permanently stored and available for review in the ultrasound unit.  Impression: Technically successful ultrasound guided injection.  Return in 1 week for Orthovisc injection left knee 3/4

## 2021-02-23 NOTE — Patient Instructions (Signed)
Thank you for coming in today.   Call or go to the ER if you develop a large red swollen joint with extreme pain or oozing puss.   

## 2021-02-23 NOTE — Telephone Encounter (Signed)
VOB has been submitted for Monovisc, left knee. Pending BV. 

## 2021-02-24 ENCOUNTER — Telehealth: Payer: Self-pay

## 2021-02-24 NOTE — Telephone Encounter (Signed)
Patient is receiving gel injection from another provider.

## 2021-03-02 ENCOUNTER — Other Ambulatory Visit: Payer: Self-pay

## 2021-03-02 ENCOUNTER — Ambulatory Visit (INDEPENDENT_AMBULATORY_CARE_PROVIDER_SITE_OTHER): Payer: Managed Care, Other (non HMO) | Admitting: Family Medicine

## 2021-03-02 ENCOUNTER — Other Ambulatory Visit: Payer: Managed Care, Other (non HMO)

## 2021-03-02 ENCOUNTER — Telehealth: Payer: Self-pay

## 2021-03-02 ENCOUNTER — Ambulatory Visit: Payer: Self-pay

## 2021-03-02 VITALS — Wt 179.0 lb

## 2021-03-02 DIAGNOSIS — Z0184 Encounter for antibody response examination: Secondary | ICD-10-CM

## 2021-03-02 DIAGNOSIS — G8929 Other chronic pain: Secondary | ICD-10-CM

## 2021-03-02 DIAGNOSIS — M25562 Pain in left knee: Secondary | ICD-10-CM | POA: Diagnosis not present

## 2021-03-02 NOTE — Progress Notes (Signed)
Kloie presents to clinic today for Orthovisc injection #3 left knee  Procedure: Real-time Ultrasound Guided Injection of left knee superior lateral patellar space Device: Philips Affiniti 50G Images permanently stored and available for review in PACS Verbal informed consent obtained.  Discussed risks and benefits of procedure. Warned about infection bleeding damage to structures skin hypopigmentation and fat atrophy among others. Patient expresses understanding and agreement Time-out conducted.   Noted no overlying erythema, induration, or other signs of local infection.   Skin prepped in a sterile fashion.   Local anesthesia: Topical Ethyl chloride.   With sterile technique and under real time ultrasound guidance:  Orthovisc 30 mg injected into knee joint. Fluid seen entering the joint capsule.   Completed without difficulty   Advised to call if fevers/chills, erythema, induration, drainage, or persistent bleeding.   Images permanently stored and available for review in the ultrasound unit.  Impression: Technically successful ultrasound guided injection.   Lot [id number]  [place] is doing significantly better.  We discussed whether or not we should proceed with 1/4 injection.  She is not sure and will think about it over the next few weeks and let me know.  If so happy to schedule.

## 2021-03-02 NOTE — Patient Instructions (Addendum)
Thank you for coming in today.  Call or go to the ER if you develop a large red swollen joint with extreme pain or oozing puss.   We could do an options 4th injection of Orthovisc.   Cancel the next scheduled injection visit and decide in the near future if your knee is ok.

## 2021-03-02 NOTE — Telephone Encounter (Signed)
Almyra Free from [facility] lab calling to ask if provider can change lab order that was placed by Dr. C to future in order for her to run Dr. Lurline Del request.  I informed her that Dr. Loletha Grayer is out of the office but I would send it to the provider od the day to see if the order can be changed.  The lab was performed today, order was placed on 02/04/21.  Please advise

## 2021-03-03 NOTE — Addendum Note (Signed)
Addended by: Lynnea Ferrier on: 03/03/2021 09:51 AM   Modules accepted: Orders

## 2021-03-04 LAB — SARS-COV-2 SEMI-QUANTITATIVE TOTAL ANTIBODY, SPIKE: SARS-CoV-2 Spike Ab Interp: POSITIVE

## 2021-03-04 LAB — SARS-COV-2 SPIKE AB DILUTION: SARS-CoV-2 Spike Ab Dilution: 1256 U/mL (ref <0.8)

## 2021-03-04 LAB — SAR COV2 SEROLOGY (COVID19)AB(IGG),IA
SARS-CoV-2 Semi-Quant IgG Ab: 155 AU/mL (ref <13.0)
SARS-CoV-2 Spike Ab Interp: POSITIVE

## 2021-03-13 ENCOUNTER — Ambulatory Visit: Payer: Managed Care, Other (non HMO) | Admitting: Family Medicine

## 2021-03-13 ENCOUNTER — Ambulatory Visit
Admission: RE | Admit: 2021-03-13 | Discharge: 2021-03-13 | Disposition: A | Discharge status: Home / Self Care | Payer: Managed Care, Other (non HMO) | Source: Ambulatory Visit | Attending: Family Medicine | Admitting: Family Medicine

## 2021-03-13 ENCOUNTER — Other Ambulatory Visit: Payer: Self-pay

## 2021-03-13 DIAGNOSIS — Z1231 Encounter for screening mammogram for malignant neoplasm of breast: Secondary | ICD-10-CM

## 2021-03-17 ENCOUNTER — Ambulatory Visit: Payer: Managed Care, Other (non HMO)

## 2021-03-18 ENCOUNTER — Other Ambulatory Visit: Payer: Self-pay | Admitting: Family Medicine

## 2021-03-18 DIAGNOSIS — R928 Other abnormal and inconclusive findings on diagnostic imaging of breast: Secondary | ICD-10-CM

## 2021-03-18 HISTORY — PX: BREAST BIOPSY: SHX20

## 2021-03-20 ENCOUNTER — Other Ambulatory Visit: Payer: Managed Care, Other (non HMO)

## 2021-04-02 ENCOUNTER — Ambulatory Visit
Admission: RE | Admit: 2021-04-02 | Discharge: 2021-04-02 | Disposition: A | Discharge status: Home / Self Care | Payer: Managed Care, Other (non HMO) | Source: Ambulatory Visit | Attending: Family Medicine | Admitting: Family Medicine

## 2021-04-02 ENCOUNTER — Other Ambulatory Visit: Payer: Self-pay

## 2021-04-02 ENCOUNTER — Other Ambulatory Visit: Payer: Self-pay | Admitting: Family Medicine

## 2021-04-02 DIAGNOSIS — N6320 Unspecified lump in the left breast, unspecified quadrant: Secondary | ICD-10-CM

## 2021-04-02 DIAGNOSIS — R928 Other abnormal and inconclusive findings on diagnostic imaging of breast: Secondary | ICD-10-CM

## 2021-04-06 ENCOUNTER — Ambulatory Visit
Admission: RE | Admit: 2021-04-06 | Discharge: 2021-04-06 | Disposition: A | Discharge status: Home / Self Care | Payer: Managed Care, Other (non HMO) | Source: Ambulatory Visit | Attending: Family Medicine | Admitting: Family Medicine

## 2021-04-06 ENCOUNTER — Other Ambulatory Visit: Payer: Self-pay

## 2021-04-06 DIAGNOSIS — N6320 Unspecified lump in the left breast, unspecified quadrant: Secondary | ICD-10-CM

## 2021-04-10 ENCOUNTER — Other Ambulatory Visit: Payer: Managed Care, Other (non HMO)

## 2021-04-10 ENCOUNTER — Ambulatory Visit: Payer: Self-pay

## 2021-04-10 ENCOUNTER — Other Ambulatory Visit: Payer: Self-pay

## 2021-04-10 ENCOUNTER — Ambulatory Visit (INDEPENDENT_AMBULATORY_CARE_PROVIDER_SITE_OTHER): Payer: Managed Care, Other (non HMO) | Admitting: Family Medicine

## 2021-04-10 DIAGNOSIS — M1712 Unilateral primary osteoarthritis, left knee: Secondary | ICD-10-CM | POA: Diagnosis not present

## 2021-04-10 DIAGNOSIS — G8929 Other chronic pain: Secondary | ICD-10-CM

## 2021-04-10 DIAGNOSIS — M25562 Pain in left knee: Secondary | ICD-10-CM | POA: Diagnosis not present

## 2021-04-10 NOTE — Progress Notes (Signed)
  Jillian Mcknight presents to clinic today for Orthovisc injection left knee 4/4  Procedure: Real-time Ultrasound Guided Injection of left knee superior lateral patellar space Device: Philips Affiniti 50G Images permanently stored and available for review in PACS Verbal informed consent obtained.  Discussed risks and benefits of procedure. Warned about infection bleeding damage to structures skin hypopigmentation and fat atrophy among others. Patient expresses understanding and agreement Time-out conducted.   Noted no overlying erythema, induration, or other signs of local infection.   Skin prepped in a sterile fashion.   Local anesthesia: Topical Ethyl chloride.   With sterile technique and under real time ultrasound guidance: Orthovisc 30 mg injected into knee joint. Fluid seen entering the joint capsule.   Completed without difficulty   Advised to call if fevers/chills, erythema, induration, drainage, or persistent bleeding.   Images permanently stored and available for review in the ultrasound unit.  Impression: Technically successful ultrasound guided injection.  Lot number: [id number]  Recheck as needed

## 2021-04-10 NOTE — Patient Instructions (Signed)
Thank you for coming in today.   You received your 4th gel injection in your knee today. Call or go to the ER if you develop a large red swollen joint with extreme pain or oozing puss.    Recheck with me as needed.

## 2021-05-12 ENCOUNTER — Encounter: Payer: Self-pay | Admitting: Family Medicine

## 2021-05-12 DIAGNOSIS — G8929 Other chronic pain: Secondary | ICD-10-CM

## 2021-05-13 ENCOUNTER — Ambulatory Visit: Payer: Managed Care, Other (non HMO) | Admitting: Family Medicine

## 2021-06-08 ENCOUNTER — Other Ambulatory Visit: Payer: Self-pay

## 2021-06-08 ENCOUNTER — Encounter: Payer: Self-pay | Admitting: Physical Therapy

## 2021-06-08 ENCOUNTER — Ambulatory Visit: Payer: Managed Care, Other (non HMO) | Attending: Family Medicine | Admitting: Physical Therapy

## 2021-06-08 DIAGNOSIS — M25611 Stiffness of right shoulder, not elsewhere classified: Secondary | ICD-10-CM | POA: Diagnosis present

## 2021-06-08 DIAGNOSIS — M25511 Pain in right shoulder: Secondary | ICD-10-CM | POA: Insufficient documentation

## 2021-06-08 DIAGNOSIS — R252 Cramp and spasm: Secondary | ICD-10-CM | POA: Diagnosis present

## 2021-06-08 DIAGNOSIS — M542 Cervicalgia: Secondary | ICD-10-CM | POA: Insufficient documentation

## 2021-06-08 DIAGNOSIS — M5412 Radiculopathy, cervical region: Secondary | ICD-10-CM | POA: Insufficient documentation

## 2021-06-08 NOTE — Therapy (Signed)
[place]. [place], [place], [address] Phone: [phone]   Fax:  [phone]  Physical Therapy Evaluation  Patient Details  Name: Jillian Mcknight MRN: [id number] Date of Birth: [birth year] Referring Provider (PT): Georgina Snell   Encounter Date: 06/08/2021   PT End of Session - 06/08/21 1440     Visit Number 1    Date for PT Re-Evaluation 09/08/21    Authorization Type cigna    PT Start Time 1400    PT Stop Time 1450    PT Time Calculation (min) 50 min    Activity Tolerance Patient tolerated treatment well    Behavior During Therapy [facility] for tasks assessed/performed             Past Medical History:  Diagnosis Date   Allergy    Arthritis    knees   Carpal tunnel syndrome of right wrist    Depression    Headache, frequent episodic tension-type     Past Surgical History:  Procedure Laterality Date   CARPAL TUNNEL RELEASE Right 08/10/2016   Procedure: RIGHT CARPAL TUNNEL RELEASE;  Surgeon: Daryll Brod, MD;  Location: [facility];  Service: Orthopedics;  Laterality: Right;  FAB   ELBOW FRACTURE SURGERY Left 11/01/2018   WISDOM TOOTH EXTRACTION  1982    There were no vitals filed for this visit.    Subjective Assessment - 06/08/21 1402     Subjective We have seen patient 4x this year, the most recent visit was April, she repoorts that she would like to really see [place] for the pain, she stopped due to deductible issues.  She reports some DDD of the cervical area, and some bursitis in the shoulder.  She has had pain in the neck and shoulder.    Patient Stated Goals have better ROM and less pain    Currently in Pain? Yes    Pain Score 3     Pain Location Neck    Pain Orientation Right    Pain Descriptors / Indicators Aching;Sore    Pain Type Acute pain    Pain Radiating Towards has some pain in the right upper trap, right shoulder, lateral arm, she had numbness and tingling in the right hand  prior to [place] see her in April    Pain Onset More than a month ago    Pain Frequency Constant    Aggravating Factors  use of the right arm, slouching,, stress pain up to 7/10    Pain Relieving Factors OTC pain meds, treatment here helps can get pain down to 0/10    Effect of Pain on Daily Activities difficultty relaxing, right arm and shoulder pain                [facility] PT Assessment - 06/08/21 0001       Assessment   Medical Diagnosis right shoulder pain (P)     Referring Provider (PT) Georgina Snell (P)     Onset Date/Surgical Date 05/25/21 (P)     Hand Dominance Right (P)     Prior Therapy 4 visits with good results (P)       Balance Screen   Has the patient fallen in the past 6 months No (P)     Has the patient had a decrease in activity level because of a fear of falling?  No (P)     Is the patient reluctant to leave their home because of a fear of falling?  No (  P)       Home Environment   Additional Comments does housework and yardwork (P)       Prior Function   Level of Independence Independent (P)     Vocation Full time employment (P)     Vocation Requirements on computer most of the day (P)     Leisure no exercise (P)       Posture/Postural Control   Posture Comments rounded shoulders , fwd head (P)       AROM   Overall AROM Comments Cervical ROM decreased 50% for flexion and side bending decreased 25% for other motions (P)     Right/Left Shoulder Right (P)     Right Shoulder Flexion 145 Degrees (P)     Right Shoulder ABduction 145 Degrees (P)     Right Shoulder Internal Rotation 60 Degrees (P)     Right Shoulder External Rotation 80 Degrees (P)       Strength   Overall Strength Comments 4/5 with some pain (P)       Flexibility   Soft Tissue Assessment /Muscle Length -- (P)    neural tension is better, still tight     Palpation   Palpation comment she has significant tightness with a trigger point in the right upper trap, tight and tender in the right cervical  area has referred pain in the right latreral upper arm, (P)                         Objective measurements completed on examination: See above findings.         Trigger Point Dry Needling - 06/08/21 0001     Consent Given? Yes    Education Handout Provided Yes    Muscles Treated Head and Neck Upper trapezius;Oblique capitus    Upper Trapezius Response Twitch reponse elicited;Palpable increased muscle length    Oblique Capitus Response Twitch response elicited;Palpable increased muscle length                    PT Short Term Goals - 06/08/21 1526       PT SHORT TERM GOAL #1   Title independent with initial HEP    Time 2    Period Weeks    Status New               PT Long Term Goals - 06/08/21 1526       PT LONG TERM GOAL #1   Title understand posture and body mechanics    Time 8    Period Weeks    Status New      PT LONG TERM GOAL #2   Title decrease pain 50%    Time 8    Period Weeks    Status New      PT LONG TERM GOAL #3   Title increase shoulder ROM to WNL's    Time 8    Period Weeks    Status New      PT LONG TERM GOAL #4   Title increase strength to 4+/5    Time 8    Period Weeks    Status New                    Plan - 06/08/21 1441     Clinical Impression Statement Patient was seen here early this year with the neck and shoulder pain, she did well with traction, STM and dry needling.  She reports that she has been active and her pain is coming back and would like to try to get it under control prior to her having issues, she does have some HA, does reports some right upper extremity numbness and tingling, having less since PT started earlier in the year.  She is very tight iwth a lot of trigger points in the neck and the right upper trap, her ROM is decreased since the last visit, strength is better.    Clinical Decision Making Low    Rehab Potential Good    PT Frequency 2x / week    PT Duration 12 weeks     PT Treatment/Interventions ADLs/Self Care Home Management;Patient/family education;Manual techniques;Dry needling;Electrical Stimulation;Moist Heat;Neuromuscular re-education;Therapeutic exercise;Therapeutic activities;Traction    PT Next Visit Plan work on the tension and spasms.    Consulted and Agree with Plan of Care Patient             Patient will benefit from skilled therapeutic intervention in order to improve the following deficits and impairments:  Decreased range of motion, Increased muscle spasms, Pain, Improper body mechanics, Postural dysfunction, Decreased strength  Visit Diagnosis: Cramp and spasm - Plan: PT plan of care cert/re-cert  Acute pain of right shoulder - Plan: PT plan of care cert/re-cert  Stiffness of right shoulder, not elsewhere classified - Plan: PT plan of care cert/re-cert  Cervicalgia - Plan: PT plan of care cert/re-cert  Radiculopathy, cervical region - Plan: PT plan of care cert/re-cert     Problem List Patient Active Problem List   Diagnosis Date Noted   Left anterior cruciate ligament tear 12/31/2020   Patellofemoral arthritis of left knee 05/06/2020   History of hyperlipidemia 10/02/2019   Anxiety and depression 09/28/2018   Allergies 09/28/2018   Patellofemoral arthritis of right knee 05/30/2017   Popliteal bursitis of left knee 12/29/2015   Hypersomnia, idiopathic 12/27/2013   Hypermobility syndrome 12/19/2013   IBS (irritable bowel syndrome) 04/06/2013   Snoring 04/06/2013   Well woman exam without gynecological exam 05/15/2012    Sumner Boast., PT 06/08/2021, 3:28 PM  [place]. [place], [place], [address] Phone: [phone]   Fax:  [phone]  Name: QUATESHA FOCHTMAN MRN: [id number] Date of Birth: 1966-03-06

## 2021-06-08 NOTE — Patient Instructions (Signed)

## 2021-06-30 ENCOUNTER — Ambulatory Visit: Payer: Managed Care, Other (non HMO) | Admitting: Physical Therapy

## 2021-07-13 ENCOUNTER — Other Ambulatory Visit: Payer: Self-pay

## 2021-07-13 ENCOUNTER — Ambulatory Visit: Payer: Managed Care, Other (non HMO) | Attending: Family Medicine | Admitting: Physical Therapy

## 2021-07-13 ENCOUNTER — Encounter: Payer: Self-pay | Admitting: Physical Therapy

## 2021-07-13 DIAGNOSIS — R252 Cramp and spasm: Secondary | ICD-10-CM | POA: Insufficient documentation

## 2021-07-13 DIAGNOSIS — M5412 Radiculopathy, cervical region: Secondary | ICD-10-CM | POA: Diagnosis present

## 2021-07-13 DIAGNOSIS — M25511 Pain in right shoulder: Secondary | ICD-10-CM | POA: Diagnosis present

## 2021-07-13 DIAGNOSIS — M25611 Stiffness of right shoulder, not elsewhere classified: Secondary | ICD-10-CM | POA: Insufficient documentation

## 2021-07-13 DIAGNOSIS — M542 Cervicalgia: Secondary | ICD-10-CM | POA: Diagnosis present

## 2021-07-13 NOTE — Therapy (Signed)
[place]. [place], [place], [address] Phone: [phone]   Fax:  [phone]  Physical Therapy Treatment  Patient Details  Name: Jillian Mcknight MRN: [id number] Date of Birth: [birth year] Referring Provider (PT): Georgina Snell   Encounter Date: 07/13/2021   PT End of Session - 07/13/21 1628     Visit Number 2    Date for PT Re-Evaluation 09/08/21    Authorization Type cigna    PT Start Time 1525    PT Stop Time 6834    PT Time Calculation (min) 42 min    Activity Tolerance Patient tolerated treatment well    Behavior During Therapy [facility] for tasks assessed/performed             Past Medical History:  Diagnosis Date   Allergy    Arthritis    knees   Carpal tunnel syndrome of right wrist    Depression    Headache, frequent episodic tension-type     Past Surgical History:  Procedure Laterality Date   CARPAL TUNNEL RELEASE Right 08/10/2016   Procedure: RIGHT CARPAL TUNNEL RELEASE;  Surgeon: Daryll Brod, MD;  Location: [facility];  Service: Orthopedics;  Laterality: Right;  FAB   ELBOW FRACTURE SURGERY Left 11/01/2018   WISDOM TOOTH EXTRACTION  1982    There were no vitals filed for this visit.   Subjective Assessment - 07/13/21 1534     Subjective Patient reports that she really feels that she got relief from the DN.  She reports that she overall feels like she is doing better, "nothing like it was".. Still some pain and stiffness and c/o tightness spasm    Currently in Pain? Yes    Pain Score 2     Pain Location Neck    Pain Orientation Right    Pain Descriptors / Indicators Aching;Tightness    Pain Relieving Factors the needling really helped                               [facility] Adult PT Treatment/Exercise - 07/13/21 0001       Manual Therapy   Manual Therapy Soft tissue mobilization;Neural Stretch;Manual Traction    Soft tissue mobilization right upper trap and  cervical area    Manual Traction occiptial release    Neural Stretch right UE              Trigger Point Dry Needling - 07/13/21 0001     Consent Given? Yes    Upper Trapezius Response Twitch reponse elicited;Palpable increased muscle length    Oblique Capitus Response Twitch response elicited;Palpable increased muscle length                     PT Short Term Goals - 07/13/21 1632       PT SHORT TERM GOAL #1   Title independent with initial HEP    Status Achieved               PT Long Term Goals - 07/13/21 1632       PT LONG TERM GOAL #1   Title understand posture and body mechanics    Status Partially Met                   Plan - 07/13/21 1629     Clinical Impression Statement Patient has really tried to do more and has been trying  to do the stretches, she has been doing well until recently started having the pain and tightness and felt that the treatment here really helps her so she has returned.  She has knots in the right upper trap and the right cervical area.  She has some right UE and shoulder pain, I added some right UE stretches, neural tension stretches and pectoral stretchews    PT Next Visit Plan work on the tension and spasms.    Consulted and Agree with Plan of Care Patient             Patient will benefit from skilled therapeutic intervention in order to improve the following deficits and impairments:  Decreased range of motion, Increased muscle spasms, Pain, Improper body mechanics, Postural dysfunction, Decreased strength  Visit Diagnosis: Cramp and spasm  Acute pain of right shoulder  Stiffness of right shoulder, not elsewhere classified  Cervicalgia  Radiculopathy, cervical region     Problem List Patient Active Problem List   Diagnosis Date Noted   Left anterior cruciate ligament tear 12/31/2020   Patellofemoral arthritis of left knee 05/06/2020   History of hyperlipidemia 10/02/2019   Anxiety and  depression 09/28/2018   Allergies 09/28/2018   Patellofemoral arthritis of right knee 05/30/2017   Popliteal bursitis of left knee 12/29/2015   Hypersomnia, idiopathic 12/27/2013   Hypermobility syndrome 12/19/2013   IBS (irritable bowel syndrome) 04/06/2013   Snoring 04/06/2013   Well woman exam without gynecological exam 05/15/2012    Sumner Boast, PT 07/13/2021, 4:33 PM  [place]. [place], [place], [address] Phone: [phone]   Fax:  [phone]  Name: Jillian Mcknight MRN: [id number] Date of Birth: [birth year]

## 2021-08-27 ENCOUNTER — Other Ambulatory Visit: Payer: Self-pay

## 2021-08-27 NOTE — Telephone Encounter (Signed)
Refill request for: Fluoxetine 10 mg  LR 06/25/20, #180, 3 rfs LOV 10/03/20 (Dr Bryan Lemma) [place] 10/06/21 [name])  Please review and advise.  Thanks.  Dm/cma

## 2021-08-28 MED ORDER — FLUOXETINE HCL 10 MG PO TABS: 20.0000 mg | ORAL_TABLET | Freq: Every day | ORAL | 0 refills | Status: DC

## 2021-10-05 ENCOUNTER — Other Ambulatory Visit: Payer: Self-pay

## 2021-10-06 ENCOUNTER — Encounter: Payer: Self-pay | Admitting: Nurse Practitioner

## 2021-10-06 ENCOUNTER — Ambulatory Visit (INDEPENDENT_AMBULATORY_CARE_PROVIDER_SITE_OTHER): Payer: Managed Care, Other (non HMO) | Admitting: Nurse Practitioner

## 2021-10-06 VITALS — BP 100/84 | HR 90 | Temp 97.8°F | Ht 65.75 in | Wt 173.2 lb

## 2021-10-06 DIAGNOSIS — Z01419 Encounter for gynecological examination (general) (routine) without abnormal findings: Secondary | ICD-10-CM | POA: Diagnosis not present

## 2021-10-06 DIAGNOSIS — Z8639 Personal history of other endocrine, nutritional and metabolic disease: Secondary | ICD-10-CM | POA: Diagnosis not present

## 2021-10-06 DIAGNOSIS — F32A Depression, unspecified: Secondary | ICD-10-CM | POA: Diagnosis not present

## 2021-10-06 DIAGNOSIS — Z124 Encounter for screening for malignant neoplasm of cervix: Secondary | ICD-10-CM

## 2021-10-06 MED ORDER — FLUOXETINE HCL 10 MG PO TABS: 20.0000 mg | ORAL_TABLET | Freq: Every day | ORAL | 3 refills | Status: DC

## 2021-10-06 NOTE — Assessment & Plan Note (Signed)
Repeat lipid panel Encouraged to maintain heart heart diet and daily exercise (124mins per week)

## 2021-10-06 NOTE — Patient Instructions (Addendum)
Go to lab for blood draw Blood draw sent to [facility] per your request PAP sent to [facility]  Maintain heart healthy diet and start daily exercise. Goal is 154mns per week.  Preventive Care [age]Years Old, Female Preventive care refers to lifestyle choices and visits with your health care provider that can promote health and wellness. Preventive care visits are also called wellness exams. What can I expect for my preventive care visit? Counseling Your health care provider may ask you questions about your: Medical history, including: Past medical problems. Family medical history. Pregnancy history. Current health, including: Menstrual cycle. Method of birth control. Emotional well-being. Home life and relationship well-being. Sexual activity and sexual health. Lifestyle, including: Alcohol, nicotine or tobacco, and drug use. Access to firearms. Diet, exercise, and sleep habits. Work and work [employer] Sun[employer] use. Safety issues such as seatbelt and bike helmet use. Physical exam Your health care provider will check your: Height and weight. These may be used to calculate your BMI (body mass index). BMI is a measurement that tells if you are at a healthy weight. Waist circumference. This measures the distance around your waistline. This measurement also tells if you are at a healthy weight and may help predict your risk of certain diseases, such as type 2 diabetes and high blood pressure. Heart rate and blood pressure. Body temperature. Skin for abnormal spots. What immunizations do I need? Vaccines are usually given at various ages, according to a schedule. Your health care provider will recommend vaccines for you based on your age, medical history, and lifestyle or other factors, such as travel or where you work. What tests do I need? Screening Your health care provider may recommend screening tests for certain conditions. This may include: Lipid and cholesterol  levels. Diabetes screening. This is done by checking your blood sugar (glucose) after you have not eaten for a while (fasting). Pelvic exam and Pap test. Hepatitis B test. Hepatitis C test. HIV (human immunodeficiency virus) test. STI (sexually transmitted infection) testing, if you are at risk. Lung cancer screening. Colorectal cancer screening. Mammogram. Talk with your health care provider about when you should start having regular mammograms. This may depend on whether you have a family history of breast cancer. BRCA-related cancer screening. This may be done if you have a family history of breast, ovarian, tubal, or peritoneal cancers. Bone density scan. This is done to screen for osteoporosis. Talk with your health care provider about your test results, treatment options, and if necessary, the need for more tests. Follow these instructions at home: Eating and drinking  Eat a diet that includes fresh fruits and vegetables, whole grains, lean protein, and low-fat dairy products. Take vitamin and mineral supplements as recommended by your health care provider. Do not drink alcohol if: Your health care provider tells you not to drink. You are pregnant, may be pregnant, or are planning to become pregnant. If you drink alcohol: Limit how much you have to 0-1 drink a day. Know how much alcohol is in your drink. In the [place] one drink equals one 12 oz bottle of beer (355 mL), one 5 oz glass of wine (148 mL), or one 1 oz glass of hard liquor (44 mL). Lifestyle Brush your teeth every morning and night with fluoride toothpaste. Floss one time each day. Exercise for at least 30 minutes 5 or more days each week. Do not use any products that contain nicotine or tobacco. These products include cigarettes, chewing tobacco, and vaping devices,  such as e-cigarettes. If you need help quitting, ask your health care provider. Do not use drugs. If you are sexually active, practice safe sex. Use a  condom or other form of protection to prevent STIs. If you do not wish to become pregnant, use a form of birth control. If you plan to become pregnant, see your health care provider for a prepregnancy visit. Take aspirin only as told by your health care provider. Make sure that you understand how much to take and what form to take. Work with your health care provider to find out whether it is safe and beneficial for you to take aspirin daily. Find healthy ways to manage stress, such as: Meditation, yoga, or listening to music. Journaling. Talking to a trusted person. Spending time with friends and family. Minimize exposure to UV radiation to reduce your risk of skin cancer. Safety Always wear your seat belt while driving or riding in a vehicle. Do not drive: If you have been drinking alcohol. Do not ride with someone who has been drinking. When you are tired or distracted. While texting. If you have been using any mind-altering substances or drugs. Wear a helmet and other protective equipment during sports activities. If you have firearms in your house, make sure you follow all gun safety procedures. Seek help if you have been physically or sexually abused. What's next? Visit your health care provider once a year for an annual wellness visit. Ask your health care provider how often you should have your eyes and teeth checked. Stay up to date on all vaccines. This information is not intended to replace advice given to you by your health care provider. Make sure you discuss any questions you have with your health care provider. Document Revised: 04/01/2021 Document Reviewed: 04/01/2021 Elsevier Patient Education  [place].

## 2021-10-06 NOTE — Assessment & Plan Note (Signed)
stable with fluoxetine 10-20mg  daily depending on circumstances. Refill sent

## 2021-10-06 NOTE — Progress Notes (Signed)
Subjective:    Patient ID: Jillian Mcknight, female    DOB: 04-21-1966, 55 y.o.   MRN: [id number]  Patient presents today for CPE and eval of chronic conditions  HPI Depressive disorder stable with fluoxetine 10-20mg  daily depending on circumstances. Refill sent  Hyperlipidemia Repeat lipid panel Encouraged to maintain heart heart diet and daily exercise (11mins per week)  Vision:up to date Dental:up to date Diet:regular Exercise:will improve exercise Weight:  Wt Readings from Last 3 Encounters:  10/06/21 173 lb 3.2 oz (78.6 kg)  03/02/21 179 lb (81.2 kg)  12/31/20 179 lb (81.2 kg)    Sexual History (orientation,birth control, marital status, STD): GYN:Dr. McComb, menopausal at age 36, up to date with mammogram, needs PAP completed today. Up to date with colonoscopy  Depression/Suicide:  Depression screen [facility] 2/9 10/06/2021 05/15/2020 10/03/2019 07/05/2015 05/15/2014 04/06/2013  Decreased Interest 0 0 0 0 0 0  Down, Depressed, Hopeless 1 0 0 0 0 0  PHQ - 2 Score 1 0 0 0 0 0  Altered sleeping 0 - 0 - - -  Tired, decreased energy 1 - 0 - - -  Change in appetite 0 - 0 - - -  Feeling bad or failure about yourself  0 - 0 - - -  Trouble concentrating 0 - 0 - - -  Moving slowly or fidgety/restless 0 - 0 - - -  Suicidal thoughts 0 - 0 - - -  PHQ-9 Score 2 - 0 - - -  Difficult doing work/chores Somewhat difficult - Not difficult at all - - -   GAD 7 : Generalized Anxiety Score 10/06/2021  Nervous, Anxious, on Edge 1  Control/stop worrying 1  Worry too much - different things 1  Trouble relaxing 1  Restless 0  Easily annoyed or irritable 1  Afraid - awful might happen 0  Total GAD 7 Score 5  Anxiety Difficulty Somewhat difficult   Immunizations: (TDAP, Hep C screen, Pneumovax, Influenza, zoster)  Health Maintenance  Topic Date Due   HIV Screening  Never done   Zoster (Shingles) Vaccine (2 of 2) 01/09/2021   Pap Smear  09/28/2021   Mammogram  03/14/2023   Colon Cancer  Screening  11/27/2025   Tetanus Vaccine  01/10/2028   Flu Shot  Completed   COVID-19 Vaccine  Completed   Hepatitis C Screening: USPSTF Recommendation to screen - Ages 18-79 yo.  Completed   Pneumococcal Vaccination  Aged Out   HPV Vaccine  Aged Out   Fall Risk: Fall Risk  05/15/2020 05/15/2014  Falls in the past year? 0 No   Medications and allergies reviewed with patient and updated if appropriate.  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     No  current facility-administered medications on file prior to visit.   Past Medical History:  Diagnosis Date   Allergy    Arthritis    knees   Carpal tunnel syndrome of right wrist    Depression    Headache, frequent episodic tension-type    Past Surgical History:  Procedure Laterality Date   CARPAL TUNNEL RELEASE Right 08/10/2016   Procedure: RIGHT CARPAL TUNNEL RELEASE;  Surgeon: Daryll Brod, MD;  Location: [place];  Service: Orthopedics;  Laterality: Right;  FAB   ELBOW FRACTURE SURGERY Left 11/01/2018   WISDOM TOOTH EXTRACTION  1982    Social History   Socioeconomic History   Marital status: Married    Spouse name: Not on file   Number of  children: Not on file   Years of education: Not on file   Highest education level: Not on file  Occupational History   Not on file  Tobacco Use   Smoking status: Never   Smokeless tobacco: Never  Vaping Use   Vaping Use: Never used  Substance and Sexual Activity   Alcohol use: Yes    Alcohol/week: 0.0 standard drinks    Comment: socially   Drug use: No   Sexual activity: Yes  Other Topics Concern   Not on file  Social History Narrative   Not on file   Social Determinants of Health   Financial Resource Strain: Not on file  Food Insecurity: Not on file  Transportation Needs: Not on file  Physical Activity: Not on file  Stress: Not on file  Social Connections: Not on file    Family History  Problem Relation Age of Onset   Heart disease Mother    Stroke Mother    Lymphoma Mother    Alzheimer's disease Father    Bipolar disorder Sister    Colitis Daughter    Diabetes Paternal Grandmother    Cancer Cousin    Colon cancer Cousin 38       colon   Colon cancer Cousin 46       colon   Breast cancer Neg Hx         Review of Systems  Constitutional:  Negative for fever, malaise/fatigue and weight loss.  HENT:  Negative for congestion and sore throat.   Eyes:        Negative for visual changes  Respiratory:  Negative for cough and shortness of breath.   Cardiovascular:  Negative for chest pain, palpitations and leg swelling.  Gastrointestinal:  Negative for blood in stool, constipation, diarrhea and heartburn.  Genitourinary:  Negative for dysuria, frequency and urgency.  Musculoskeletal:  Negative for falls, joint pain and myalgias.  Skin:  Negative for rash.  Neurological:  Negative for dizziness, sensory change and headaches.  Endo/Heme/Allergies:  Does not bruise/bleed easily.  Psychiatric/Behavioral:  Negative for depression, substance abuse and suicidal ideas. The patient is not nervous/anxious.    Objective:   Vitals:   10/06/21 0821  BP: 100/84  Pulse:  90  Temp: 97.8 F (36.6 C)  SpO2: 99%    Body mass index is 28.17 kg/m.   Physical Examination:  Physical Exam Exam conducted with a chaperone present.  Constitutional:      General: She is not in acute distress. HENT:     Right Ear: Tympanic membrane, ear canal and external ear normal.     Left Ear: Tympanic membrane, ear canal and external ear normal.  Eyes:     General: No scleral icterus.  Extraocular Movements: Extraocular movements intact.     Conjunctiva/sclera: Conjunctivae normal.  Cardiovascular:     Rate and Rhythm: Normal rate and regular rhythm.     Pulses: Normal pulses.     Heart sounds: Normal heart sounds.  Pulmonary:     Effort: Pulmonary effort is normal. No respiratory distress.     Breath sounds: Normal breath sounds.  Chest:     Comments: Declined breast exam. Abdominal:     General: Bowel sounds are normal. There is no distension.     Palpations: Abdomen is soft.     Hernia: There is no hernia in the left inguinal area or right inguinal area.  Genitourinary:    General: Normal vulva.     Exam position: Lithotomy position.     Labia:        Right: No rash, tenderness or lesion.        Left: No rash, tenderness or lesion.      Vagina: Normal. No vaginal discharge.     Cervix: Normal.     Uterus: Normal.   Musculoskeletal:        General: Normal range of motion.     Cervical back: Normal range of motion and neck supple.     Right lower leg: No edema.     Left lower leg: No edema.  Lymphadenopathy:     Cervical: No cervical adenopathy.     Lower Body: No right inguinal adenopathy. No left inguinal adenopathy.  Skin:    General: Skin is warm and dry.  Neurological:     Mental Status: She is alert and oriented to person, place, and time.  Psychiatric:        Behavior: Behavior normal.   ASSESSMENT and PLAN: This visit occurred during the SARS-CoV-2 public health emergency.  Safety protocols were in place, including screening questions  prior to the visit, additional usage of staff PPE, and extensive cleaning of exam room while observing appropriate contact time as indicated for disinfecting solutions.   Daleysa was seen today for establish care.  Diagnoses and all orders for this visit:  Well woman exam with routine gynecological exam -     Cytology - PAP( [place]) -     Cancel: Comprehensive metabolic panel -     Cancel: CBC with Differential/Platelet -     Cancel: TSH -     CBC -     Comprehensive metabolic panel -     TSH  Depressive disorder -     FLUoxetine (PROZAC) 10 MG tablet; Take 2 tablets (20 mg total) by mouth daily.  History of hyperlipidemia -     Cancel: Lipid panel -     Lipid panel  Encounter for Papanicolaou smear for cervical cancer screening -     Cytology - PAP( [place])      Problem List Items Addressed This Visit       Other   Depressive disorder    stable with fluoxetine 10-20mg  daily depending on circumstances. Refill sent      Relevant Medications   FLUoxetine (PROZAC) 10 MG tablet   Hyperlipidemia    Repeat lipid panel Encouraged to maintain heart heart diet and daily exercise (147mins per week)      Other Visit Diagnoses     Well woman exam with routine gynecological exam    -  Primary   Relevant Orders   Cytology - PAP( [place])   CBC   Comprehensive metabolic panel  TSH   Encounter for Papanicolaou smear for cervical cancer screening       Relevant Orders   Cytology - PAP( [place])       Follow up: Return in about 1 year (around 10/06/2022) for CPE (fasting).  Wilfred Lacy, NP

## 2021-10-07 LAB — COMPREHENSIVE METABOLIC PANEL
ALT: 9 IU/L (ref 0–32)
AST: 14 IU/L (ref 0–40)
Albumin/Globulin Ratio: 2 (ref 1.2–2.2)
Albumin: 4.5 g/dL (ref 3.8–4.9)
Alkaline Phosphatase: 68 IU/L (ref 44–121)
BUN/Creatinine Ratio: 15 (ref 9–23)
BUN: 12 mg/dL (ref 6–24)
Bilirubin Total: 0.4 mg/dL (ref 0.0–1.2)
CO2: 24 mmol/L (ref 20–29)
Calcium: 9.8 mg/dL (ref 8.7–10.2)
Chloride: 105 mmol/L (ref 96–106)
Creatinine, Ser: 0.79 mg/dL (ref 0.57–1.00)
Globulin, Total: 2.3 g/dL (ref 1.5–4.5)
Glucose: 85 mg/dL (ref 70–99)
Potassium: 4.6 mmol/L (ref 3.5–5.2)
Sodium: 143 mmol/L (ref 134–144)
Total Protein: 6.8 g/dL (ref 6.0–8.5)
eGFR: 87 mL/min/{1.73_m2} (ref >59)

## 2021-10-07 LAB — LIPID PANEL
Chol/HDL Ratio: 3 ratio (ref 0.0–4.4)
Cholesterol, Total: 254 mg/dL — ABNORMAL HIGH (ref 100–199)
HDL: 85 mg/dL (ref >39)
LDL Chol Calc (NIH): 160 mg/dL — ABNORMAL HIGH (ref 0–99)
Triglycerides: 59 mg/dL (ref 0–149)
VLDL Cholesterol Cal: 9 mg/dL (ref 5–40)

## 2021-10-07 LAB — CBC
Hematocrit: 42.6 % (ref 34.0–46.6)
Hemoglobin: 14.2 g/dL (ref 11.1–15.9)
MCH: 30 pg (ref 26.6–33.0)
MCHC: 33.3 g/dL (ref 31.5–35.7)
MCV: 90 fL (ref 79–97)
Platelets: 317 10*3/uL (ref 150–450)
RBC: 4.73 x10E6/uL (ref 3.77–5.28)
RDW: 12.3 % (ref 11.7–15.4)
WBC: 5 10*3/uL (ref 3.4–10.8)

## 2021-10-07 LAB — TSH: TSH: 2.74 u[IU]/mL (ref 0.450–4.500)

## 2021-10-08 ENCOUNTER — Encounter: Payer: Managed Care, Other (non HMO) | Admitting: Family Medicine

## 2021-10-16 LAB — CYTOLOGY - PAP: Comment: NEGATIVE

## 2021-11-19 NOTE — Progress Notes (Signed)
I, Wendy Poet, LAT, ATC, am serving as scribe for Dr. Lynne Leader.  Jillian Mcknight is a 56 y.o. 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  She was last seen by Dr. Georgina Snell on 04/10/21 for her final Orthovisc injection in the series.  Today, pt reports L knee hasn't started hurting yet. She notes she's only experienced a few "twinges" of pain in her L knee.  Dx imaging: 12/30/20 L knee MRI             05/06/20 L knee XR  Pertinent review of systems: No fevers or chills  Relevant historical information: IBS.  Knee pain.  Chronic ACL tear.   Exam:  BP 138/88    Pulse 91    Ht 5\' 5"  (1.651 m)    Wt 174 lb 6.4 oz (79.1 kg)    LMP  (LMP Unknown)    SpO2 99%    BMI 29.02 kg/m  General: Well Developed, well nourished, and in no acute distress.   MSK: Normal gait    Lab and Radiology Results  EXAM: MRI OF THE LEFT KNEE WITHOUT CONTRAST   TECHNIQUE: Multiplanar, multisequence MR imaging of the knee was performed. No intravenous contrast was administered.   COMPARISON:  09/02/2014   FINDINGS: MENISCI   Medial: Intact.   Lateral: Intact.   LIGAMENTS   Cruciates: Complete chronic ACL tear. Intact PCL.   Collaterals: Medial collateral ligament is intact. Lateral collateral ligament complex is intact.   CARTILAGE   Patellofemoral: Partial-thickness cartilage loss of the inferior aspect of the lateral patellar facet.   Medial: High-grade partial-thickness cartilage loss of the medial femorotibial compartment.   Lateral: Partial thickness cartilage loss of the lateral femorotibial compartment.   JOINT: Moderate joint effusion. Normal Hoffa's fat-pad. No plical thickening.   POPLITEAL FOSSA: Popliteus tendon is intact. No Baker's cyst. Small multiloculated ganglion cyst arising from the medial gastrocnemius tendon origin.   EXTENSOR MECHANISM: Intact quadriceps  tendon. Intact patellar tendon. Intact lateral patellar retinaculum. Intact medial patellar retinaculum. Intact MPFL.   BONES: No aggressive osseous lesion. No fracture or dislocation.   Other: No fluid collection or hematoma. Muscles are normal.   IMPRESSION: 1. Tricompartmental cartilage abnormalities as described above. 2. Complete chronic ACL tear.     Electronically Signed   By: Kathreen Devoid   On: 12/31/2020 09:29 I, Lynne Leader, personally (independently) visualized and performed the interpretation of the images attached in this note.      Assessment and Plan: 56 y.o. female with left knee pain.  Doing pretty well.  She last had hyaluronic acid injections ending Mar 02, 2021.  Recently Gelsyn injections were authorized expiring on December 28, 2021.  She is here to discuss her options.  She notes that she is not actually hurting all that much.  After discussion I recommended that if she is not hurting we should not do gel injections and wait until she does her.  It is very likely that we are going to have to [employer] injections prior to doing them again.  I have advised her to contact me ahead of time.  The meantime continue quad strengthening and maintaining body weight.  Total encounter time 20 minutes including face-to-face time with the patient and, reviewing past medical record, and charting on the date of service.   Treatment plan and options.  Discussed warning signs or symptoms. Please see discharge instructions. Patient expresses understanding.   The above documentation has been reviewed and is accurate and complete Lynne Leader, M.D.

## 2021-11-23 ENCOUNTER — Ambulatory Visit (INDEPENDENT_AMBULATORY_CARE_PROVIDER_SITE_OTHER): Payer: Managed Care, Other (non HMO) | Admitting: Family Medicine

## 2021-11-23 ENCOUNTER — Other Ambulatory Visit: Payer: Self-pay

## 2021-11-23 VITALS — BP 138/88 | HR 91 | Ht 65.0 in | Wt 174.4 lb

## 2021-11-23 DIAGNOSIS — G8929 Other chronic pain: Secondary | ICD-10-CM | POA: Diagnosis not present

## 2021-11-23 DIAGNOSIS — M25562 Pain in left knee: Secondary | ICD-10-CM

## 2021-11-23 DIAGNOSIS — M1712 Unilateral primary osteoarthritis, left knee: Secondary | ICD-10-CM

## 2021-11-23 NOTE — Patient Instructions (Addendum)
Thank you for coming in today.   Send me a MyChart message when your knee starts hurting again and we can re-authorize you for gel shots (current authorization expires 12/28/21)

## 2021-12-10 ENCOUNTER — Ambulatory Visit: Payer: Self-pay

## 2021-12-10 ENCOUNTER — Ambulatory Visit (INDEPENDENT_AMBULATORY_CARE_PROVIDER_SITE_OTHER): Payer: Managed Care, Other (non HMO) | Admitting: Family Medicine

## 2021-12-10 ENCOUNTER — Other Ambulatory Visit: Payer: Self-pay

## 2021-12-10 ENCOUNTER — Encounter: Payer: Self-pay | Admitting: Family Medicine

## 2021-12-10 DIAGNOSIS — M25562 Pain in left knee: Secondary | ICD-10-CM

## 2021-12-10 DIAGNOSIS — M1712 Unilateral primary osteoarthritis, left knee: Secondary | ICD-10-CM

## 2021-12-10 DIAGNOSIS — G8929 Other chronic pain: Secondary | ICD-10-CM

## 2021-12-10 NOTE — Patient Instructions (Signed)
Good to see you today.  You had a L knee Gelsyn injection (1/3).  Call or go to the ER if you develop a large red swollen joint with extreme pain or oozing puss.   Please schedule 2 follow-up visits (1/week for the next 2 weeks) to complete the [facility] series.

## 2021-12-10 NOTE — Progress Notes (Signed)
Asianae presents to clinic today for Gelsyn injection left knee 1/3  Procedure: Real-time Ultrasound Guided Injection of left knee superior lateral patellar space Device: Philips Affiniti 50G Images permanently stored and available for review in PACS Verbal informed consent obtained.  Discussed risks and benefits of procedure. Warned about infection bleeding damage to structures skin hypopigmentation and fat atrophy among others. Patient expresses understanding and agreement Time-out conducted.   Noted no overlying erythema, induration, or other signs of local infection.   Skin prepped in a sterile fashion.   Local anesthesia: Topical Ethyl chloride.   With sterile technique and under real time ultrasound guidance: Gelsyn 16.8 mg injected into the knee joint. Fluid seen entering the joint capsule.   Completed without difficulty   Advised to call if fevers/chills, erythema, induration, drainage, or persistent bleeding.   Images permanently stored and available for review in the ultrasound unit.  Impression: Technically successful ultrasound guided injection.   Lot number: [id number]  Return in 1 week for Gelsyn injection left knee 2/3

## 2021-12-10 NOTE — Telephone Encounter (Signed)
I called Jillian Mcknight.  She is approved for [facility] until March 13.  Plan to schedule asap.

## 2021-12-17 ENCOUNTER — Other Ambulatory Visit: Payer: Self-pay

## 2021-12-17 ENCOUNTER — Ambulatory Visit: Payer: Managed Care, Other (non HMO) | Admitting: Family Medicine

## 2021-12-17 ENCOUNTER — Ambulatory Visit: Payer: Self-pay

## 2021-12-17 DIAGNOSIS — M25562 Pain in left knee: Secondary | ICD-10-CM

## 2021-12-17 DIAGNOSIS — G8929 Other chronic pain: Secondary | ICD-10-CM | POA: Diagnosis not present

## 2021-12-17 DIAGNOSIS — M1712 Unilateral primary osteoarthritis, left knee: Secondary | ICD-10-CM

## 2021-12-17 NOTE — Progress Notes (Signed)
Jillian Mcknight presents to clinic today for Gelsyn injection left knee 2/3 ? ?Procedure: Real-time Ultrasound Guided Injection of left knee superior lateral patellar space ?Device: Philips Affiniti 50G ?Images permanently stored and available for review in PACS ?Verbal informed consent obtained.  Discussed risks and benefits of procedure. Warned about infection bleeding damage to structures skin hypopigmentation and fat atrophy among others. ?Patient expresses understanding and agreement ?Time-out conducted.   ?Noted no overlying erythema, induration, or other signs of local infection.   ?Skin prepped in a sterile fashion.   ?Local anesthesia: Topical Ethyl chloride.   ?With sterile technique and under real time ultrasound guidance: Gelsyn 16.8 mg injected into knee joint. Fluid seen entering the joint capsule.   ?Completed without difficulty   ?Advised to call if fevers/chills, erythema, induration, drainage, or persistent bleeding.   ?Images permanently stored and available for review in the ultrasound unit.  ?Impression: Technically successful ultrasound guided injection. ? ?Lot number: [id number] ? ?Return in 1 week for Gelsyn injection left knee 3/3 ? ? ?

## 2021-12-17 NOTE — Patient Instructions (Signed)
Thank you for coming in today.  ? ?You received the 2nd Gelsyn injection in your left knee today. Seek immediate medical attention if the joint becomes red, extremely painful, or is oozing fluid.  ? ?We will see you next week for the 3rd Gelsyn injection ?

## 2021-12-24 ENCOUNTER — Ambulatory Visit: Payer: Self-pay

## 2021-12-24 ENCOUNTER — Other Ambulatory Visit: Payer: Self-pay

## 2021-12-24 ENCOUNTER — Ambulatory Visit: Payer: Managed Care, Other (non HMO) | Admitting: Family Medicine

## 2021-12-24 DIAGNOSIS — M1712 Unilateral primary osteoarthritis, left knee: Secondary | ICD-10-CM

## 2021-12-24 DIAGNOSIS — G8929 Other chronic pain: Secondary | ICD-10-CM | POA: Diagnosis not present

## 2021-12-24 DIAGNOSIS — M25562 Pain in left knee: Secondary | ICD-10-CM

## 2021-12-24 NOTE — Patient Instructions (Signed)
You had your 3rd and final Gelsyn injection today.   Call or go to the ER if you develop a large red swollen joint with extreme pain or oozing puss.  ? ?Follow-up as needed ?

## 2021-12-24 NOTE — Progress Notes (Signed)
Jillian Mcknight presents to clinic today for Gelsyn injection left knee 3/3 ? ?Procedure: Real-time Ultrasound Guided Injection of left knee superior lateral patellar space ?Device: Philips Affiniti 50G ?Images permanently stored and available for review in PACS ?Verbal informed consent obtained.  Discussed risks and benefits of procedure. Warned about infection bleeding damage to structures skin hypopigmentation and fat atrophy among others. ?Patient expresses understanding and agreement ?Time-out conducted.   ?Noted no overlying erythema, induration, or other signs of local infection.   ?Skin prepped in a sterile fashion.   ?Local anesthesia: Topical Ethyl chloride.   ?With sterile technique and under real time ultrasound guidance: Gelsyn 16.8 mg injected into knee joint. Fluid seen entering the joint capsule.   ?Completed without difficulty   ?Advised to call if fevers/chills, erythema, induration, drainage, or persistent bleeding.   ?Images permanently stored and available for review in the ultrasound unit.  ?Impression: Technically successful ultrasound guided injection. ? ?Lot number: [id number] ? ? ?

## 2022-03-29 ENCOUNTER — Other Ambulatory Visit: Payer: Self-pay | Admitting: Nurse Practitioner

## 2022-03-29 DIAGNOSIS — Z1231 Encounter for screening mammogram for malignant neoplasm of breast: Secondary | ICD-10-CM

## 2022-04-13 ENCOUNTER — Ambulatory Visit
Admission: RE | Admit: 2022-04-13 | Discharge: 2022-04-13 | Disposition: A | Discharge status: Home / Self Care | Payer: Managed Care, Other (non HMO) | Source: Ambulatory Visit | Attending: Nurse Practitioner | Admitting: Nurse Practitioner

## 2022-04-13 DIAGNOSIS — Z1231 Encounter for screening mammogram for malignant neoplasm of breast: Secondary | ICD-10-CM

## 2022-10-05 ENCOUNTER — Other Ambulatory Visit: Payer: Self-pay | Admitting: Nurse Practitioner

## 2022-10-05 DIAGNOSIS — F32A Depression, unspecified: Secondary | ICD-10-CM

## 2022-10-06 NOTE — Telephone Encounter (Signed)
Chart supports Rx Last OV: 09/2021 Next OV: 10/2022

## 2022-10-15 ENCOUNTER — Encounter: Payer: Managed Care, Other (non HMO) | Admitting: Nurse Practitioner

## 2022-10-20 ENCOUNTER — Encounter: Payer: Self-pay | Admitting: Nurse Practitioner

## 2022-10-20 ENCOUNTER — Ambulatory Visit (INDEPENDENT_AMBULATORY_CARE_PROVIDER_SITE_OTHER): Payer: Managed Care, Other (non HMO) | Admitting: Nurse Practitioner

## 2022-10-20 ENCOUNTER — Other Ambulatory Visit (HOSPITAL_COMMUNITY)
Admission: RE | Admit: 2022-10-20 | Discharge: 2022-10-20 | Disposition: A | Discharge status: Home / Self Care | Payer: Managed Care, Other (non HMO) | Source: Ambulatory Visit | Attending: Nurse Practitioner | Admitting: Nurse Practitioner

## 2022-10-20 VITALS — BP 112/72 | HR 77 | Temp 97.9°F | Ht 65.0 in | Wt 175.8 lb

## 2022-10-20 DIAGNOSIS — Z124 Encounter for screening for malignant neoplasm of cervix: Secondary | ICD-10-CM | POA: Diagnosis not present

## 2022-10-20 DIAGNOSIS — F3341 Major depressive disorder, recurrent, in partial remission: Secondary | ICD-10-CM

## 2022-10-20 DIAGNOSIS — Z0001 Encounter for general adult medical examination with abnormal findings: Secondary | ICD-10-CM | POA: Diagnosis not present

## 2022-10-20 DIAGNOSIS — E7800 Pure hypercholesterolemia, unspecified: Secondary | ICD-10-CM | POA: Diagnosis not present

## 2022-10-20 NOTE — Progress Notes (Signed)
Complete physical exam  Patient: Jillian Mcknight   DOB: Feb 26, 1966   57 y.o. Female  MRN: [id number] Visit Date: 10/20/2022  Subjective:    Chief Complaint  Patient presents with   Annual Exam    CPE, Pt fasting  Increased Cholesterol levels    Jillian Mcknight is a 57 y.o. female who presents today for a complete physical exam. She reports consuming a general diet.  Inconsistent exercise routine.  She generally feels well. She reports sleeping well. She does have additional problems to discuss today.  Vision:Yes Dental:Yes STD Screen:No  Wt Readings from Last 3 Encounters:  10/20/22 175 lb 12.8 oz (79.7 kg)  11/23/21 174 lb 6.4 oz (79.1 kg)  10/06/21 173 lb 3.2 oz (78.6 kg)    BP Readings from Last 3 Encounters:  10/20/22 112/72  11/23/21 138/88  10/06/21 100/84    Most recent fall risk assessment:    05/15/2020   10:03 AM  Fall Risk   Falls in the past year? 0   Most recent depression screenings:    10/20/2022    9:15 AM 10/06/2021    8:49 AM  PHQ 2/9 Scores  PHQ - 2 Score 0 1  PHQ- 9 Score 2 2   HPI  Hyperlipidemia Advised about the importance of DASH diet and exercise. Provided printed information. Repeat lipid panel in 70month (fasting)  Depressive disorder Stable with fluoxetine Med refill sent  Past Medical History:  Diagnosis Date   Allergy    Arthritis    knees   Carpal tunnel syndrome of right wrist    Depression    Headache, frequent episodic tension-type    Past Surgical History:  Procedure Laterality Date   BREAST BIOPSY Left 03/2021   CARPAL TUNNEL RELEASE Right 08/10/2016   Procedure: RIGHT CARPAL TUNNEL RELEASE;  Surgeon: GDaryll Brod MD;  Location: [facility]  Service: Orthopedics;  Laterality: Right;  FAB   ELBOW FRACTURE SURGERY Left 11/01/2018   WISDOM TOOTH EXTRACTION  1982   Social History   Socioeconomic History   Marital status: Married    Spouse name: Not on file   Number of children: Not on file   Years  of education: Not on file   Highest education level: Not on file  Occupational History   Not on file  Tobacco Use   Smoking status: Never   Smokeless tobacco: Never  Vaping Use   Vaping Use: Never used  Substance and Sexual Activity   Alcohol use: Yes    Alcohol/week: 0.0 standard drinks of alcohol    Comment: socially   Drug use: No   Sexual activity: Yes  Other Topics Concern   Not on file  Social History Narrative   Not on file   Social Determinants of Health   Financial Resource Strain: Not on file  Food Insecurity: Not on file  Transportation Needs: Not on file  Physical Activity: Not on file  Stress: Not on file  Social Connections: Not on file  Intimate Partner Violence: Not on file   Family Status  Relation Name Status   Mother  Deceased   Father  Deceased   Sister  Alive   Daughter  (Not Specified)   PGM  (Not Specified)   Cousin  Alive   Cousin  Alive   Neg Hx  (Not Specified)   Family History  Problem Relation Age of Onset   Heart disease Mother    Stroke Mother    Lymphoma  Mother    Alzheimer's disease Father    Bipolar disorder Sister    Colitis Daughter    Diabetes Paternal Grandmother    Cancer Cousin    Colon cancer Cousin 34       colon   Colon cancer Cousin 45       colon   Breast cancer Neg Hx    Allergies  Allergen Reactions   Sulfamethoxazole-Trimethoprim     Other reaction(s): Abdominal Pain   Other    Sulfamethoxazole     Other reaction(s): GI Upset (intolerance)    Patient Care Team: Kay Shippy, Charlene Brooke, NP as PCP - General (Internal Medicine)   Medications: Outpatient Medications Prior to Visit  Medication Sig   AMBULATORY NON FORMULARY MEDICATION Therapeutic massage As needed for trap pain   FLUoxetine (PROZAC) 10 MG tablet TAKE 2 TABLETS BY MOUTH DAILY   melatonin 5 MG TABS Take 5 mg by mouth at bedtime.   No facility-administered medications prior to visit.    Review of Systems  Constitutional:  Negative for  fever.  HENT:  Negative for congestion and sore throat.   Eyes:        Negative for visual changes  Respiratory:  Negative for cough and shortness of breath.   Cardiovascular:  Negative for chest pain, palpitations and leg swelling.  Gastrointestinal:  Negative for blood in stool, constipation and diarrhea.  Genitourinary:  Negative for dysuria, frequency and urgency.  Musculoskeletal:  Negative for myalgias.  Skin:  Negative for rash.  Neurological:  Negative for dizziness and headaches.  Hematological:  Does not bruise/bleed easily.  Psychiatric/Behavioral:  Negative for suicidal ideas. The patient is not nervous/anxious.         Objective:  BP 112/72 (BP Location: Right Arm, Patient Position: Sitting, Cuff Size: Normal)   Pulse 77   Temp 97.9 F (36.6 C) (Temporal)   Ht '5\' 5"'$  (1.651 m)   Wt 175 lb 12.8 oz (79.7 kg)   LMP  (LMP Unknown)   SpO2 98%   BMI 29.25 kg/m     Physical Exam Vitals reviewed. Exam conducted with a chaperone present.  Constitutional:      General: She is not in acute distress. HENT:     Right Ear: Tympanic membrane, ear canal and external ear normal.     Left Ear: Tympanic membrane, ear canal and external ear normal.     Nose: Nose normal.     Mouth/Throat:     Pharynx: No oropharyngeal exudate.  Eyes:     General: No scleral icterus.    Extraocular Movements: Extraocular movements intact.     Conjunctiva/sclera: Conjunctivae normal.     Pupils: Pupils are equal, round, and reactive to light.  Cardiovascular:     Rate and Rhythm: Normal rate and regular rhythm.     Pulses: Normal pulses.     Heart sounds: Normal heart sounds.  Pulmonary:     Effort: Pulmonary effort is normal. No respiratory distress.     Breath sounds: Normal breath sounds.  Chest:  Breasts:    Breasts are symmetrical.     Right: Normal.     Left: Normal.  Abdominal:     General: Bowel sounds are normal. There is no distension.     Palpations: Abdomen is soft.      Hernia: There is no hernia in the left inguinal area or right inguinal area.  Genitourinary:    General: Normal vulva.     Exam position: Lithotomy position.  Labia:        Right: No rash, tenderness or lesion.        Left: No rash, tenderness or lesion.      Vagina: Normal. No vaginal discharge.     Cervix: Normal.     Uterus: Normal.      Adnexa: Right adnexa normal and left adnexa normal.  Musculoskeletal:        General: Normal range of motion.     Cervical back: Normal range of motion and neck supple.     Right lower leg: No edema.     Left lower leg: No edema.  Lymphadenopathy:     Cervical: No cervical adenopathy.     Upper Body:     Right upper body: No supraclavicular, axillary or pectoral adenopathy.     Left upper body: No supraclavicular, axillary or pectoral adenopathy.     Lower Body: No right inguinal adenopathy. No left inguinal adenopathy.  Skin:    General: Skin is warm and dry.  Neurological:     Mental Status: She is alert and oriented to person, place, and time.  Psychiatric:        Mood and Affect: Mood normal.        Behavior: Behavior normal.        Thought Content: Thought content normal.      No results found for any visits on 10/20/22.    Assessment & Plan:    Routine Health Maintenance and Physical Exam  Immunization History  Administered Date(s) Administered   Influenza Inj Mdck Quad Pf 06/24/2018   Influenza Inj Mdck Quad With Preservative 08/08/2021   Influenza Split 07/18/2013, 07/15/2014   Influenza, Quadrivalent, Recombinant, Inj, Pf 08/03/2018   Influenza,inj,Quad PF,6+ Mos 07/19/2015, 06/28/2017, 06/28/2019   Influenza-Unspecified 08/01/2016, 08/03/2018, 07/13/2020, 08/18/2022   Moderna Covid-19 Vaccine Bivalent Booster 48yr & up 07/16/2021   PFIZER(Purple Top)SARS-COV-2 Vaccination 12/17/2019, 01/18/2020, 08/08/2020   Pneumococcal Polysaccharide-23 10/05/2019   Tdap 01/23/2010, 05/15/2014, 01/09/2018   Zoster Recombinat  (Shingrix) 11/14/2020, 01/09/2021   Zoster, Live 01/24/2021    Health Maintenance  Topic Date Due   HIV Screening  Never done   COVID-19 Vaccine (5 - 2023-24 season) 11/05/2022 (Originally 06/18/2022)   MAMMOGRAM  04/13/2024   PAP SMEAR-Modifier  10/06/2024   COLONOSCOPY (Pts 45-473yrInsurance coverage will need to be confirmed)  11/27/2025   DTaP/Tdap/Td (4 - Td or Tdap) 01/10/2028   INFLUENZA VACCINE  Completed   Hepatitis C Screening  Completed   Zoster Vaccines- Shingrix  Completed   HPV VACCINES  Aged Out    Discussed health benefits of physical activity, and encouraged her to engage in regular exercise appropriate for her age and condition.  Problem List Items Addressed This Visit       Other   Depressive disorder    Stable with fluoxetine Med refill sent      Hyperlipidemia - Primary    Advised about the importance of DASH diet and exercise. Provided printed information. Repeat lipid panel in 90m50monthfasting)      Relevant Orders   Lipid panel   Other Visit Diagnoses     Encounter for preventative adult health care exam with abnormal findings       Relevant Orders   Comprehensive metabolic panel   Encounter for Papanicolaou smear for cervical cancer screening       Relevant Orders   Cytology - PAP      Return in about 1 year (around 10/21/2023) for CPE (fasting).  Wilfred Lacy, NP

## 2022-10-20 NOTE — Assessment & Plan Note (Signed)
Stable with fluoxetine Med refill sent

## 2022-10-20 NOTE — Patient Instructions (Signed)
Go to lab for CMP check today Return to lab in 18month for repeat lipid panel (fasting) Maintain heart healthy diet and daily exercise.  Fat and Cholesterol Restricted Eating Plan Getting too much fat and cholesterol in your diet may cause health problems. Choosing the right foods helps keep your fat and cholesterol at normal levels. This can keep you from getting certain diseases. Your doctor may recommend an eating plan that includes: Total fat: ______% or less of total calories a day. This is ______g of fat a day. Saturated fat: ______% or less of total calories a day. This is ______g of saturated fat a day. Cholesterol: less than _________mg a day. Fiber: ______g a day. What are tips for following this plan? General tips Work with your doctor to lose weight if you need to. Avoid: Foods with added sugar. Fried foods. Foods with trans fat or partially hydrogenated oils. This includes some margarines and baked goods. If you drink alcohol: Limit how much you have to: 0-1 drink a day for women who are not pregnant. 0-2 drinks a day for men. Know how much alcohol is in a drink. In the [place] one drink equals one 12 oz bottle of beer (355 mL), one 5 oz glass of wine (148 mL), or one 1 oz glass of hard liquor (44 mL). Reading food labels Check food labels for: Trans fats. Partially hydrogenated oils. Saturated fat (g) in each serving. Cholesterol (mg) in each serving. Fiber (g) in each serving. Choose foods with healthy fats, such as: Monounsaturated fats and polyunsaturated fats. These include olive and canola oil, flaxseeds, walnuts, almonds, and seeds. Omega-3 fats. These are found in certain fish, flaxseed oil, and ground flaxseeds. Choose grain products that have whole grains. Look for the word "whole" as the first word in the ingredient list. Cooking Cook foods using low-fat methods. These include baking, boiling, grilling, and broiling. Eat more home-cooked foods. Eat at  restaurants and buffets less often. Eat less fast food. Avoid cooking using saturated fats, such as butter, cream, palm oil, palm kernel oil, and coconut oil. Meal planning  At meals, divide your plate into four equal parts: Fill one-half of your plate with vegetables, green salads, and fruit. Fill one-fourth of your plate with whole grains. Fill one-fourth of your plate with low-fat (lean) protein foods. Eat fish that is high in omega-3 fats at least two times a week. This includes mackerel, tuna, sardines, and salmon. Eat foods that are high in fiber, such as whole grains, beans, apples, pears, berries, broccoli, carrots, peas, and barley. What foods should I eat? Fruits All fresh, canned (in natural juice), or frozen fruits. Vegetables Fresh or frozen vegetables (raw, steamed, roasted, or grilled). Green salads. Grains Whole grains, such as whole wheat or whole grain breads, crackers, cereals, and pasta. Unsweetened oatmeal, bulgur, barley, quinoa, or brown rice. Corn or whole wheat flour tortillas. Meats and other protein foods Ground beef (85% or leaner), grass-fed beef, or beef trimmed of fat. Skinless chicken or t[place] Ground chicken or t[place] Pork trimmed of fat. All fish and seafood. Egg whites. Dried beans, peas, or lentils. Unsalted nuts or seeds. Unsalted canned beans. Nut butters without added sugar or oil. Dairy Low-fat or nonfat dairy products, such as skim or 1% milk, 2% or reduced-fat cheeses, low-fat and fat-free ricotta or cottage cheese, or plain low-fat and nonfat yogurt. Fats and oils Tub margarine without trans fats. Light or reduced-fat mayonnaise and salad dressings. Avocado. Olive, canola, sesame, or safflower oils.  The items listed above may not be a complete list of foods and beverages you can eat. Contact a dietitian for more information. What foods should I avoid? Fruits Canned fruit in heavy syrup. Fruit in cream or butter sauce. Fried  fruit. Vegetables Vegetables cooked in cheese, cream, or butter sauce. Fried vegetables. Grains White bread. White pasta. White rice. Cornbread. Bagels, pastries, and croissants. Crackers and snack foods that contain trans fat and hydrogenated oils. Meats and other protein foods Fatty cuts of meat. Ribs, chicken wings, bacon, sausage, bologna, salami, chitterlings, fatback, hot dogs, bratwurst, and packaged lunch meats. Liver and organ meats. Whole eggs and egg yolks. Chicken and [place] with skin. Fried meat. Dairy Whole or 2% milk, cream, half-and-half, and cream cheese. Whole milk cheeses. Whole-fat or sweetened yogurt. Full-fat cheeses. Nondairy creamers and whipped toppings. Processed cheese, cheese spreads, and cheese curds. Fats and oils Butter, stick margarine, lard, shortening, ghee, or bacon fat. Coconut, palm kernel, and palm oils. Beverages Alcohol. Sugar-sweetened drinks such as sodas, lemonade, and fruit drinks. Sweets and desserts Corn syrup, sugars, honey, and molasses. Candy. Jam and jelly. Syrup. Sweetened cereals. Cookies, pies, cakes, donuts, muffins, and ice cream. The items listed above may not be a complete list of foods and beverages you should avoid. Contact a dietitian for more information. Summary Choosing the right foods helps keep your fat and cholesterol at normal levels. This can keep you from getting certain diseases. At meals, fill one-half of your plate with vegetables, green salads, and fruits. Eat high fiber foods, like whole grains, beans, apples, pears, berries, carrots, peas, and barley. Limit added sugar, saturated fats, alcohol, and fried foods. This information is not intended to replace advice given to you by your health care provider. Make sure you discuss any questions you have with your health care provider. Document Revised: 02/13/2021 Document Reviewed: 02/13/2021 Elsevier Patient Education  [employer].

## 2022-10-20 NOTE — Assessment & Plan Note (Signed)
Advised about the importance of DASH diet and exercise. Provided printed information. Repeat lipid panel in 59month (fasting)

## 2022-10-21 LAB — CYTOLOGY - PAP
Comment: NEGATIVE
Diagnosis: NEGATIVE
High risk HPV: NEGATIVE

## 2022-10-21 LAB — COMPREHENSIVE METABOLIC PANEL
ALT: 17 IU/L (ref 0–32)
AST: 17 IU/L (ref 0–40)
Albumin/Globulin Ratio: 1.9 (ref 1.2–2.2)
Albumin: 4.6 g/dL (ref 3.8–4.9)
Alkaline Phosphatase: 69 IU/L (ref 44–121)
BUN/Creatinine Ratio: 20 (ref 9–23)
BUN: 14 mg/dL (ref 6–24)
Bilirubin Total: 0.6 mg/dL (ref 0.0–1.2)
CO2: 23 mmol/L (ref 20–29)
Calcium: 9.5 mg/dL (ref 8.7–10.2)
Chloride: 103 mmol/L (ref 96–106)
Creatinine, Ser: 0.7 mg/dL (ref 0.57–1.00)
Globulin, Total: 2.4 g/dL (ref 1.5–4.5)
Glucose: 84 mg/dL (ref 70–99)
Potassium: 5.1 mmol/L (ref 3.5–5.2)
Sodium: 141 mmol/L (ref 134–144)
Total Protein: 7 g/dL (ref 6.0–8.5)
eGFR: 100 mL/min/{1.73_m2} (ref >59)

## 2022-10-21 LAB — LIPID PANEL
Chol/HDL Ratio: 2.9 ratio (ref 0.0–4.4)
Cholesterol, Total: 291 mg/dL — ABNORMAL HIGH (ref 100–199)
HDL: 100 mg/dL (ref >39)
LDL Chol Calc (NIH): 179 mg/dL — ABNORMAL HIGH (ref 0–99)
Triglycerides: 75 mg/dL (ref 0–149)
VLDL Cholesterol Cal: 12 mg/dL (ref 5–40)

## 2022-10-21 NOTE — Addendum Note (Signed)
Addended by: Wilfred Lacy L on: 10/21/2022 03:12 PM   Modules accepted: Orders

## 2023-01-03 ENCOUNTER — Encounter: Payer: Self-pay | Admitting: Nurse Practitioner

## 2023-01-03 ENCOUNTER — Telehealth: Payer: Managed Care, Other (non HMO) | Admitting: Nurse Practitioner

## 2023-01-03 VITALS — Wt 170.0 lb

## 2023-01-03 DIAGNOSIS — F418 Other specified anxiety disorders: Secondary | ICD-10-CM

## 2023-01-03 DIAGNOSIS — F331 Major depressive disorder, recurrent, moderate: Secondary | ICD-10-CM

## 2023-01-03 MED ORDER — FLUOXETINE HCL 10 MG PO TABS: 30.0000 mg | ORAL_TABLET | Freq: Every day | ORAL | 5 refills | Status: DC

## 2023-01-03 MED ORDER — ALPRAZOLAM 0.25 MG PO TABS: 0.2500 mg | ORAL_TABLET | Freq: Every day | ORAL | 0 refills | Status: DC | PRN

## 2023-01-03 NOTE — Assessment & Plan Note (Addendum)
Reports worsening mood in last 1year due changes in personal and work life. This has led to intermittent crying, and feeling overwhelmed She initiated CBT sessions 3weeks ago, these have been helpful. Previous use of lexapro 26yrs ago, not sure why med was switched to prozac. Reports she is compliant with prozac dose-20mg . No SI/HI/hallucination No previous IVC No ETOH consumption  We discussed need to increased prozac dose to 30mg  with prn use of alprazolam or combine with buspar vs change of med (effexor vs cymbalta vs zoloft). She opted to increase prozac dose to 30mg  and use xanax prn. Advised about possible side effects with xanax and risk of dependence. She was also informed xanax rx will be temporal. She verbalized understanding. Prozac 30mg  and xanax 0.25mg  sent Advised to maintain CBT sessions F/up in 38month

## 2023-01-03 NOTE — Progress Notes (Signed)
Virtual Visit via Video Note  I connected withNAME@ on 01/03/23 at  1:20 PM EDT by a video enabled telemedicine application and verified that I am speaking with the correct person using two identifiers.  Location: Patient:Home Provider: Office Participants: patient and provider  I discussed the limitations of evaluation and management by telemedicine and the availability of in person appointments. I also discussed with the patient that there may be a patient responsible charge related to this service. The patient expressed understanding and agreed to proceed.  [id number] and depression  History of Present Illness:  Depressive disorder Reports worsening mood in last 1year due changes in personal and work life. This has led to intermittent crying, and feeling overwhelmed She initiated CBT sessions 3weeks ago, these have been helpful. Previous use of lexapro 20yrs ago, not sure why med was switched to prozac. Reports she is compliant with prozac dose-20mg . No SI/HI/hallucination No previous IVC No ETOH consumption  We discussed need to increased prozac dose to 30mg  with prn use of alprazolam or combine with buspar vs change of med (effexor vs cymbalta vs zoloft). She opted to increase prozac dose to 30mg  and use xanax prn. Advised about possible side effects with xanax and risk of dependence. She was also informed xanax rx will be temporal. She verbalized understanding. Prozac 30mg  and xanax 0.25mg  sent Advised to maintain CBT sessions F/up in 48month  Observations/Objective: Physical Exam Psychiatric:        Attention and Perception: Attention normal.        Mood and Affect: Affect is tearful.        Speech: Speech normal.        Behavior: Behavior is cooperative.        Thought Content: Thought content normal.        Cognition and Memory: Cognition and memory normal.        Judgment: Judgment normal.     Assessment and Plan: Nehir was seen today for medication change  .  Diagnoses and all orders for this visit:  Situational anxiety -     ALPRAZolam (XANAX) 0.25 MG tablet; Take 1-2 tablets (0.25-0.5 mg total) by mouth daily as needed for anxiety.  Moderate episode of recurrent major depressive disorder (HCC) -     FLUoxetine (PROZAC) 10 MG tablet; Take 3 tablets (30 mg total) by mouth daily. -     ALPRAZolam (XANAX) 0.25 MG tablet; Take 1-2 tablets (0.25-0.5 mg total) by mouth daily as needed for anxiety.   Follow Up Instructions: See instructions above   I discussed the assessment and treatment plan with the patient. The patient was provided an opportunity to ask questions and all were answered. The patient agreed with the plan and demonstrated an understanding of the instructions.   The patient was advised to call back or seek an in-person evaluation if the symptoms worsen or if the condition fails to improve as anticipated.  Wilfred Lacy, NP

## 2023-02-04 ENCOUNTER — Telehealth: Payer: Managed Care, Other (non HMO) | Admitting: Nurse Practitioner

## 2023-02-04 ENCOUNTER — Encounter: Payer: Self-pay | Admitting: Nurse Practitioner

## 2023-02-04 DIAGNOSIS — F32A Depression, unspecified: Secondary | ICD-10-CM | POA: Insufficient documentation

## 2023-02-04 DIAGNOSIS — F3341 Major depressive disorder, recurrent, in partial remission: Secondary | ICD-10-CM

## 2023-02-04 DIAGNOSIS — F411 Generalized anxiety disorder: Secondary | ICD-10-CM | POA: Diagnosis not present

## 2023-02-04 MED ORDER — FLUOXETINE HCL 10 MG PO TABS: 30.0000 mg | ORAL_TABLET | Freq: Every day | ORAL | 3 refills | Status: DC

## 2023-02-04 NOTE — Assessment & Plan Note (Signed)
Improved mood with fluoxetine  daily and CBT sessions Has use alprazolam twice since last appt. Denies any med adverse effects.  Maintain med doses and CBT sessions She opted to f/up as needed

## 2023-02-04 NOTE — Progress Notes (Signed)
Virtual Visit via Video Note  I connected withNAME@ on 02/04/23 at 11:00 AM EDT by a video enabled telemedicine application and verified that I am speaking with the correct person using two identifiers.  Location: Patient:Home Provider: Office Participants: patient and provider  I discussed the limitations of evaluation and management by telemedicine and the availability of in person appointments. I also discussed with the patient that there may be a patient responsible charge related to this service. The patient expressed understanding and agreed to proceed.  [id number] and depression f/up  History of Present Illness:  GAD (generalized anxiety disorder) Improved mood with fluoxetine  daily and CBT sessions Has use alprazolam twice since last appt. Denies any med adverse effects.  Maintain med doses and CBT sessions She opted to f/up as needed     02/04/2023   10:34 AM 10/20/2022    9:15 AM 10/06/2021    8:49 AM  Depression screen PHQ 2/9  Decreased Interest 0 0 0  Down, Depressed, Hopeless 0 0 1  PHQ - 2 Score 0 0 1  Altered sleeping 1 1 0  Tired, decreased energy Change in appetite 1 0 0  Feeling bad or failure about yourself  1 0 0  Trouble concentrating 1 0 0  Moving slowly or fidgety/restless 0 0 0  Suicidal thoughts 0 0 0  PHQ-9 Score Difficult doing work/chores Somewhat difficult Somewhat difficult Somewhat difficult       02/04/2023   10:35 AM 10/20/2022    9:15 AM 10/06/2021    8:49 AM  GAD 7 : Generalized Anxiety Score  Nervous, Anxious, on Edge 0 0 1  Control/stop worrying Worry too much - different things Trouble relaxing Restless 0 0 0  Easily annoyed or irritable 1 0 1  Afraid - awful might happen 1 0 0  Total GAD 7 Score Anxiety Difficulty Somewhat difficult Somewhat difficult Somewhat difficult   Observations/Objective: Physical Exam Vitals reviewed.  Constitutional:      General: She is not in acute  distress. Pulmonary:     Effort: Pulmonary effort is normal.  Neurological:     Mental Status: She is alert.  Psychiatric:        Mood and Affect: Mood normal.        Behavior: Behavior normal.        Thought Content: Thought content normal.     Assessment and Plan: Pessy was seen today for medical management of chronic issues.  Diagnoses and all orders for this visit:  Recurrent major depressive disorder, in partial remission -     FLUoxetine (PROZAC) 10 MG tablet; Take 3 tablets (30 mg total) by mouth daily.  GAD (generalized anxiety disorder) -     FLUoxetine (PROZAC) 10 MG tablet; Take 3 tablets (30 mg total) by mouth daily.   Follow Up Instructions: See instructions above   I discussed the assessment and treatment plan with the patient. The patient was provided an opportunity to ask questions and all were answered. The patient agreed with the plan and demonstrated an understanding of the instructions.   The patient was advised to call back or seek an in-person evaluation if the symptoms worsen or if the condition fails to improve as anticipated.  Alysia Penna, NP

## 2023-06-16 ENCOUNTER — Other Ambulatory Visit: Payer: Self-pay | Admitting: Nurse Practitioner

## 2023-06-16 DIAGNOSIS — Z0000 Encounter for general adult medical examination without abnormal findings: Secondary | ICD-10-CM

## 2023-07-04 ENCOUNTER — Ambulatory Visit
Admission: RE | Admit: 2023-07-04 | Discharge: 2023-07-04 | Disposition: A | Discharge status: Home / Self Care | Payer: Managed Care, Other (non HMO) | Source: Ambulatory Visit | Attending: Nurse Practitioner | Admitting: Nurse Practitioner

## 2023-07-04 DIAGNOSIS — Z0000 Encounter for general adult medical examination without abnormal findings: Secondary | ICD-10-CM

## 2023-08-02 ENCOUNTER — Other Ambulatory Visit: Payer: Self-pay | Admitting: Nurse Practitioner

## 2023-08-02 DIAGNOSIS — F411 Generalized anxiety disorder: Secondary | ICD-10-CM

## 2023-08-02 DIAGNOSIS — F3341 Major depressive disorder, recurrent, in partial remission: Secondary | ICD-10-CM

## 2023-10-25 ENCOUNTER — Ambulatory Visit (INDEPENDENT_AMBULATORY_CARE_PROVIDER_SITE_OTHER): Payer: Managed Care, Other (non HMO) | Admitting: Nurse Practitioner

## 2023-10-25 ENCOUNTER — Encounter: Payer: Self-pay | Admitting: Nurse Practitioner

## 2023-10-25 VITALS — BP 122/68 | HR 77 | Temp 98.0°F | Resp 18 | Ht 65.0 in | Wt 180.6 lb

## 2023-10-25 DIAGNOSIS — E7800 Pure hypercholesterolemia, unspecified: Secondary | ICD-10-CM

## 2023-10-25 DIAGNOSIS — F3342 Major depressive disorder, recurrent, in full remission: Secondary | ICD-10-CM | POA: Diagnosis not present

## 2023-10-25 DIAGNOSIS — Z0001 Encounter for general adult medical examination with abnormal findings: Secondary | ICD-10-CM

## 2023-10-25 DIAGNOSIS — F411 Generalized anxiety disorder: Secondary | ICD-10-CM

## 2023-10-25 DIAGNOSIS — Z0000 Encounter for general adult medical examination without abnormal findings: Secondary | ICD-10-CM

## 2023-10-25 LAB — LIPID PANEL
Cholesterol: 295 mg/dL — ABNORMAL HIGH (ref 0–200)
HDL: 84 mg/dL (ref >39.00)
LDL Cholesterol: 195 mg/dL — ABNORMAL HIGH (ref 0–99)
NonHDL: 210.51
Total CHOL/HDL Ratio: 4
Triglycerides: 79 mg/dL (ref 0.0–149.0)
VLDL: 15.8 mg/dL (ref 0.0–40.0)

## 2023-10-25 LAB — COMPREHENSIVE METABOLIC PANEL
ALT: 9 U/L (ref 0–35)
AST: 13 U/L (ref 0–37)
Albumin: 4.4 g/dL (ref 3.5–5.2)
Alkaline Phosphatase: 60 U/L (ref 39–117)
BUN: 16 mg/dL (ref 6–23)
CO2: 29 meq/L (ref 19–32)
Calcium: 9.4 mg/dL (ref 8.4–10.5)
Chloride: 102 meq/L (ref 96–112)
Creatinine, Ser: 0.77 mg/dL (ref 0.40–1.20)
GFR: 84.65 mL/min (ref >60.00)
Glucose, Bld: 84 mg/dL (ref 70–99)
Potassium: 4.4 meq/L (ref 3.5–5.1)
Sodium: 138 meq/L (ref 135–145)
Total Bilirubin: 0.6 mg/dL (ref 0.2–1.2)
Total Protein: 6.8 g/dL (ref 6.0–8.3)

## 2023-10-25 LAB — CBC
HCT: 42 % (ref 36.0–46.0)
Hemoglobin: 14 g/dL (ref 12.0–15.0)
MCHC: 33.3 g/dL (ref 30.0–36.0)
MCV: 93.5 fL (ref 78.0–100.0)
Platelets: 294 10*3/uL (ref 150.0–400.0)
RBC: 4.49 Mil/uL (ref 3.87–5.11)
RDW: 13.5 % (ref 11.5–15.5)
WBC: 4.3 10*3/uL (ref 4.0–10.5)

## 2023-10-25 LAB — TSH: TSH: 3.26 u[IU]/mL (ref 0.35–5.50)

## 2023-10-25 MED ORDER — FLUOXETINE HCL 10 MG PO TABS: 20.0000 mg | ORAL_TABLET | Freq: Every day | ORAL | 3 refills | Status: DC

## 2023-10-25 NOTE — Patient Instructions (Signed)
 Go to lab Maintain Heart healthy diet and daily exercise. Maintain current medications.  Mediterranean Diet A Mediterranean diet is based on the traditions of countries on the [employer]. It focuses on eating more: Fruits and vegetables. Whole grains, beans, nuts, and seeds. Heart-healthy fats. These are fats that are good for your heart. It involves eating less: Dairy. Meat and eggs. Processed foods with added sugar, salt, and fat. This type of diet can help prevent certain conditions. It can also improve outcomes if you have a long-term (chronic) disease, such as kidney or heart disease. What are tips for following this plan? Reading food labels Check packaged foods for: The serving size. For foods such as rice and pasta, the serving size is the amount of cooked product, not dry. The total fat. Avoid foods with saturated fat or trans fat. Added sugars, such as corn syrup. Shopping  Try to have a balanced diet. Buy a variety of foods, such as: Fresh fruits and vegetables. You may be able to get these from local farmers markets. You can also buy them frozen. Grains, beans, nuts, and seeds. Some of these can be bought in bulk. Fresh seafood. Poultry and eggs. Low-fat dairy products. Buy whole ingredients instead of foods that have already been packaged. If you can't get fresh seafood, buy precooked frozen shrimp or canned fish, such as tuna, salmon, or sardines. Stock your pantry so you always have certain foods on hand, such as olive oil, canned tuna, canned tomatoes, rice, pasta, and beans. Cooking Cook foods with extra-virgin olive oil instead of using butter or other vegetable oils. Have meat as a side dish. Have vegetables or grains as your main dish. This means having meat in small portions or adding small amounts of meat to foods like pasta or stew. Use beans or vegetables instead of meat in common dishes like chili or lasagna. Try out different cooking methods. Try  roasting, broiling, steaming, and sauting vegetables. Add frozen vegetables to soups, stews, pasta, or rice. Add nuts or seeds for added healthy fats and plant protein at each meal. You can add these to yogurt, salads, or vegetable dishes. Marinate fish or vegetables using olive oil, lemon juice, garlic, and fresh herbs. Meal planning Plan to eat a vegetarian meal one day each week. Try to work up to two vegetarian meals, if possible. Eat seafood two or more times a week. Have healthy snacks on hand. These may include: Vegetable sticks with hummus. Greek yogurt. Fruit and nut trail mix. Eat balanced meals. These should include: Fruit: 2-3 servings a day. Vegetables: 4-5 servings a day. Low-fat dairy: 2 servings a day. Fish, poultry, or lean meat: 1 serving a day. Beans and legumes: 2 or more servings a week. Nuts and seeds: 1-2 servings a day. Whole grains: 6-8 servings a day. Extra-virgin olive oil: 3-4 servings a day. Limit red meat and sweets to just a few servings a month. Lifestyle  Try to cook and eat meals with your family. Drink enough fluid to keep your pee (urine) pale yellow. Be active every day. This includes: Aerobic exercise, which is exercise that causes your heart to beat faster. Examples include running and swimming. Leisure activities like gardening, walking, or housework. Get 7-8 hours of sleep each night. Drink red wine if your provider says you can. A glass of wine is 5 oz (150 mL). You may be allowed to have: Up to 1 glass a day if you're female and not pregnant. Up to 2 glasses  a day if you're female. What foods should I eat? Fruits Apples. Apricots. Avocado. Berries. Bananas. Cherries. Dates. Figs. Grapes. Lemons. Melon. Oranges. Peaches. Plums. Pomegranate. Vegetables Artichokes. Beets. Broccoli. Cabbage. Carrots. Eggplant. Green beans. Chard. Kale. Spinach. Onions. Leeks. Peas. Squash. Tomatoes. Peppers. Radishes. Grains Whole-grain pasta. Brown rice.  Bulgur wheat. Polenta. Couscous. Whole-wheat bread. Orpah Cobb. Meats and other proteins Beans. Almonds. Sunflower seeds. Pine nuts. Peanuts. Cod. Salmon. Scallops. Shrimp. Tuna. Tilapia. Clams. Oysters. Eggs. Chicken or [place] without skin. Dairy Low-fat milk. Cheese. Greek yogurt. Fats and oils Extra-virgin olive oil. Avocado oil. Grapeseed oil. Beverages Water. Red wine. Herbal tea. Sweets and desserts Greek yogurt with honey. Baked apples. Poached pears. Trail mix. Seasonings and condiments Basil. Cilantro. Coriander. Cumin. Mint. Parsley. Sage. Rosemary. Tarragon. Garlic. Oregano. Thyme. Pepper. Balsamic vinegar. Tahini. Hummus. Tomato sauce. Olives. Mushrooms. The items listed above may not be all the foods and drinks you can have. Talk to a dietitian to learn more. What foods should I limit? This is a list of foods that should be eaten rarely. Fruits Fruit canned in syrup. Vegetables Deep-fried potatoes, like [place] fries. Grains Packaged pasta or rice dishes. Cereal with added sugar. Snacks with added sugar. Meats and other proteins Beef. Pork. Lamb. Chicken or [place] with skin. Hot dogs. Tomasa Blase. Dairy Ice cream. Sour cream. Whole milk. Fats and oils Butter. Canola oil. Vegetable oil. Beef fat (tallow). Lard. Beverages Juice. Sugar-sweetened soft drinks. Beer. Liquor and spirits. Sweets and desserts Cookies. Cakes. Pies. Candy. Seasonings and condiments Mayonnaise. Pre-made sauces and marinades. The items listed above may not be all the foods and drinks you should limit. Talk to a dietitian to learn more. Where to find more information American Heart Association (AHA): heart.org This information is not intended to replace advice given to you by your health care provider. Make sure you discuss any questions you have with your health care provider. Document Revised: 01/16/2023 Document Reviewed: 01/16/2023 Elsevier Patient Education  2024 [employer].

## 2023-10-25 NOTE — Progress Notes (Signed)
 Complete physical exam  Patient: Jillian Mcknight   DOB: [birth year]   58 y.o. Female  MRN: [id number] Visit Date: 10/25/2023  Subjective:    Chief Complaint  Patient presents with   Annual Exam   Jillian Mcknight is a 58 y.o. female who presents today for a complete physical exam. She reports consuming a general diet.  No regular exercise regimen  She generally feels well. She reports sleeping well. She does not have additional problems to discuss today.  Vision:Yes Dental:Yes STD Screen:No  BP Readings from Last 3 Encounters:  10/25/23 122/68  10/20/22 112/72  11/23/21 138/88   Wt Readings from Last 3 Encounters:  10/25/23 180 lb 9.6 oz (81.9 kg)  01/03/23 170 lb (77.1 kg)  10/20/22 175 lb 12.8 oz (79.7 kg)   Most recent fall risk assessment:    10/25/2023    8:04 AM  Fall Risk   Falls in the past year? 0  Number falls in past yr: 0  Injury with Fall? 0  Risk for fall due to : No Fall Risks  Follow up Falls evaluation completed   Depression screen:Yes - No Depression  Most recent depression screenings:    10/25/2023    8:09 AM 02/04/2023   10:34 AM  PHQ 2/9 Scores  PHQ - 2 Score 0 0  PHQ- 9 Score 0 5   HPI  Hyperlipidemia No Fhx of premature CAD. No hx of tobacco use or illicit drug use or ALCOHOL abuse or DIABETES. Repeat lipid panel Advised to maintain a mediterranean diet  GAD (generalized anxiety disorder) Improved and stable mood She opted to decreased fluoxetine  dose to 20mg  daily. Med refill sent  Past Medical History:  Diagnosis Date   Allergy    Arthritis    knees   Carpal tunnel syndrome of right wrist    Depression    Headache, frequent episodic tension-type    Past Surgical History:  Procedure Laterality Date   BREAST BIOPSY Left 03/2021   CARPAL TUNNEL RELEASE Right 08/10/2016   Procedure: RIGHT CARPAL TUNNEL RELEASE;  Surgeon: Arley Curia, MD;  Location: [facility];  Service: Orthopedics;  Laterality: Right;  FAB   ELBOW  FRACTURE SURGERY Left 11/01/2018   WISDOM TOOTH EXTRACTION  1982   Social History   Socioeconomic History   Marital status: Married    Spouse name: Not on file   Number of children: Not on file   Years of education: Not on file   Highest education level: Not on file  Occupational History   Not on file  Tobacco Use   Smoking status: Never   Smokeless tobacco: Never  Vaping Use   Vaping status: Never Used  Substance and Sexual Activity   Alcohol use: Yes    Alcohol/week: 0.0 standard drinks of alcohol    Comment: socially   Drug use: No   Sexual activity: Yes  Other Topics Concern   Not on file  Social History Narrative   Not on file   Social Drivers of Health   Financial Resource Strain: Not on file  Food Insecurity: Not on file  Transportation Needs: Not on file  Physical Activity: Not on file  Stress: Not on file  Social Connections: Not on file  Intimate Partner Violence: Not on file   Family Status  Relation Name Status   Mother  Deceased   Father  Deceased   Sister  Alive   Daughter  (Not Specified)   PGM  (  Not Specified)   Cousin  Alive   Cousin  Alive   Neg Hx  (Not Specified)  No partnership data on file   Family History  Problem Relation Age of Onset   Heart disease Mother    Stroke Mother    Lymphoma Mother    Alzheimer's disease Father    Bipolar disorder Sister    Colitis Daughter    Diabetes Paternal Grandmother    Cancer Cousin    Colon cancer Cousin 45       colon   Colon cancer Cousin 45       colon   Breast cancer Neg Hx    Allergies  Allergen Reactions   Sulfamethoxazole-Trimethoprim     Other reaction(s): Abdominal Pain   Other    Sulfamethoxazole Nausea And Vomiting    Other reaction(s): GI Upset (intolerance)    Patient Care Team: NcheRoselie Rockford, NP as PCP - General (Internal Medicine)   Medications: Outpatient Medications Prior to Visit  Medication Sig   [DISCONTINUED] FLUoxetine  (PROZAC ) 10 MG tablet TAKE 3  TABLETS BY MOUTH DAILY   [DISCONTINUED] ALPRAZolam  (XANAX ) 0.25 MG tablet Take 1-2 tablets (0.25-0.5 mg total) by mouth daily as needed for anxiety.   No facility-administered medications prior to visit.    Review of Systems  Constitutional:  Negative for activity change, appetite change and unexpected weight change.  Respiratory: Negative.    Cardiovascular: Negative.   Gastrointestinal: Negative.   Endocrine: Negative for cold intolerance and heat intolerance.  Genitourinary: Negative.   Musculoskeletal: Negative.   Skin: Negative.   Neurological: Negative.   Hematological: Negative.   Psychiatric/Behavioral:  Negative for behavioral problems, decreased concentration, dysphoric mood, hallucinations, self-injury, sleep disturbance and suicidal ideas. The patient is not nervous/anxious.         Objective:  BP 122/68 (BP Location: Left Arm, Patient Position: Sitting, Cuff Size: Large)   Pulse 77   Temp 98 F (36.7 C) (Temporal)   Resp 18   Ht 5' 5 (1.651 m)   Wt 180 lb 9.6 oz (81.9 kg)   LMP  (LMP Unknown)   SpO2 98%   BMI 30.05 kg/m     Physical Exam Vitals and nursing note reviewed.  Constitutional:      General: She is not in acute distress. HENT:     Right Ear: Tympanic membrane, ear canal and external ear normal.     Left Ear: Tympanic membrane, ear canal and external ear normal.     Nose: Nose normal.  Eyes:     Extraocular Movements: Extraocular movements intact.     Conjunctiva/sclera: Conjunctivae normal.     Pupils: Pupils are equal, round, and reactive to light.  Neck:     Thyroid : No thyroid  mass, thyromegaly or thyroid  tenderness.  Cardiovascular:     Rate and Rhythm: Normal rate and regular rhythm.     Pulses: Normal pulses.     Heart sounds: Normal heart sounds.  Pulmonary:     Effort: Pulmonary effort is normal.     Breath sounds: Normal breath sounds.  Abdominal:     General: Bowel sounds are normal.     Palpations: Abdomen is soft.   Musculoskeletal:        General: Normal range of motion.     Cervical back: Normal range of motion and neck supple.     Right lower leg: No edema.     Left lower leg: No edema.  Lymphadenopathy:     Cervical: No  cervical adenopathy.  Skin:    General: Skin is warm and dry.  Neurological:     Mental Status: She is alert and oriented to person, place, and time.     Cranial Nerves: No cranial nerve deficit.  Psychiatric:        Mood and Affect: Mood normal.        Behavior: Behavior normal.        Thought Content: Thought content normal.      No results found for any visits on 10/25/23.    Assessment & Plan:    Routine Health Maintenance and Physical Exam  Immunization History  Administered Date(s) Administered   Influenza Inj Mdck Quad Pf 06/24/2018   Influenza Inj Mdck Quad With Preservative 08/08/2021   Influenza Split 07/18/2013, 07/15/2014   Influenza, Quadrivalent, Recombinant, Inj, Pf 08/03/2018   Influenza,inj,Quad PF,6+ Mos 07/19/2015, 06/28/2017, 06/28/2019   Influenza-Unspecified 08/01/2016, 08/03/2018, 07/13/2020, 08/18/2022, 07/16/2023   Moderna Covid-19 Vaccine Bivalent Booster 17yrs & up 07/16/2021   PFIZER(Purple Top)SARS-COV-2 Vaccination 12/17/2019, 01/18/2020, 08/08/2020   Pneumococcal Polysaccharide-23 10/05/2019   Tdap 01/23/2010, 05/15/2014, 01/09/2018   Zoster Recombinant(Shingrix) 11/14/2020, 01/09/2021   Zoster, Live 01/24/2021    Health Maintenance  Topic Date Due   COVID-19 Vaccine (5 - 2024-25 season) 01/16/2024 (Originally 06/19/2023)   HIV Screening  10/24/2024 (Originally 10/06/1981)   MAMMOGRAM  07/03/2025   Colonoscopy  11/27/2025   Cervical Cancer Screening (HPV/Pap Cotest)  10/21/2027   DTaP/Tdap/Td (4 - Td or Tdap) 01/10/2028   INFLUENZA VACCINE  Completed   Hepatitis C Screening  Completed   Zoster Vaccines- Shingrix  Completed   HPV VACCINES  Aged Out    Discussed health benefits of physical activity, and encouraged her to  engage in regular exercise appropriate for her age and condition.  Problem List Items Addressed This Visit     GAD (generalized anxiety disorder)   Improved and stable mood She opted to decreased fluoxetine  dose to 20mg  daily. Med refill sent      Relevant Medications   FLUoxetine  (PROZAC ) 10 MG tablet   Hyperlipidemia   No Fhx of premature CAD. No hx of tobacco use or illicit drug use or ALCOHOL abuse or DIABETES. Repeat lipid panel Advised to maintain a mediterranean diet      Relevant Orders   Lipid panel   Other Visit Diagnoses       Encounter for preventative adult health care exam with abnormal findings    -  Primary   Relevant Orders   Comprehensive metabolic panel   TSH   CBC     Recurrent major depressive disorder, in full remission (HCC)       Relevant Medications   FLUoxetine  (PROZAC ) 10 MG tablet      Return in about 1 year (around 10/24/2024) for CPE (fasting).     Roselie Mood, NP

## 2023-10-25 NOTE — Assessment & Plan Note (Signed)
 Improved and stable mood She opted to decreased fluoxetine dose to 20mg  daily. Med refill sent

## 2023-10-25 NOTE — Assessment & Plan Note (Addendum)
 No Fhx of premature CAD. No hx of tobacco use or illicit drug use or ALCOHOL abuse or DIABETES. Repeat lipid panel Advised to maintain a mediterranean diet

## 2024-01-03 ENCOUNTER — Other Ambulatory Visit: Payer: Self-pay | Admitting: Nurse Practitioner

## 2024-01-03 DIAGNOSIS — F411 Generalized anxiety disorder: Secondary | ICD-10-CM

## 2024-01-03 DIAGNOSIS — F3342 Major depressive disorder, recurrent, in full remission: Secondary | ICD-10-CM

## 2024-01-03 NOTE — Telephone Encounter (Addendum)
 Called and left a detailed message per DPR on file asking patient to give me a call at [phone] to confirm which pharmacy she uses.   Same Rx was sent to local pharmacy [facility] in [place] on [address].

## 2024-01-05 MED ORDER — FLUOXETINE HCL 10 MG PO TABS: 20.0000 mg | ORAL_TABLET | Freq: Every day | ORAL | 3 refills | Status: DC

## 2024-01-05 NOTE — Telephone Encounter (Signed)
 Left voice message asking to give me a call at [phone]. I will also send a MyChart message

## 2024-01-05 NOTE — Telephone Encounter (Signed)
 Copied from CRM [id number]. Topic: Clinical - Prescription Issue >> Jan 05, 2024 12:50 PM [place] wrote: Reason for CRM: patient called back [facility] mail order pharmacy for Fluoxetine (Prozac) 10 mg is where she would like it to be sent to.

## 2024-01-05 NOTE — Telephone Encounter (Signed)
 Sent Rx for Fluoxetine (Prozac) 10 mg-Take 2 tablets by mouth daily was sent to mail order pharmacy as 180 day supply with 3 refills.

## 2024-01-24 ENCOUNTER — Telehealth: Payer: Self-pay | Admitting: Nurse Practitioner

## 2024-01-24 DIAGNOSIS — E7800 Pure hypercholesterolemia, unspecified: Secondary | ICD-10-CM

## 2024-01-24 DIAGNOSIS — Z0184 Encounter for antibody response examination: Secondary | ICD-10-CM

## 2024-01-24 NOTE — Telephone Encounter (Signed)
 Pt called [place] requesting to have labs done in the near future for a lipid panel and blood test for measles.

## 2024-01-24 NOTE — Telephone Encounter (Signed)
 Called and left a voice message per DPR for patient asking to give me a call back at the office at [phone]. I will try calling again.

## 2024-01-24 NOTE — Telephone Encounter (Signed)
 Called patient back. She stated that she was wanting the lipid labs because she has been taking the red rice yeast tablets for 3 months along with diet changes and wanted to know if it is helping with her cholesterol. She also stated that she will be traveling in later summer and wanted the measles test done. I informed her that I will pass the updated information to [place] and get back to her via MyChart. She thanked me for calling.

## 2024-01-24 NOTE — Addendum Note (Signed)
 Addended by: Dorris Fetch on: 01/24/2024 03:12 PM   Modules accepted: Orders

## 2024-03-06 ENCOUNTER — Ambulatory Visit: Admitting: Family Medicine

## 2024-03-09 ENCOUNTER — Ambulatory Visit: Payer: Self-pay | Admitting: Nurse Practitioner

## 2024-03-09 ENCOUNTER — Other Ambulatory Visit (INDEPENDENT_AMBULATORY_CARE_PROVIDER_SITE_OTHER)

## 2024-03-09 DIAGNOSIS — E7800 Pure hypercholesterolemia, unspecified: Secondary | ICD-10-CM | POA: Diagnosis not present

## 2024-03-09 DIAGNOSIS — Z0184 Encounter for antibody response examination: Secondary | ICD-10-CM

## 2024-03-09 LAB — LIPID PANEL
Cholesterol: 216 mg/dL — ABNORMAL HIGH (ref 0–200)
HDL: 81.2 mg/dL (ref >39.00)
LDL Cholesterol: 125 mg/dL — ABNORMAL HIGH (ref 0–99)
NonHDL: 134.4
Total CHOL/HDL Ratio: 3
Triglycerides: 45 mg/dL (ref 0.0–149.0)
VLDL: 9 mg/dL (ref 0.0–40.0)

## 2024-03-15 ENCOUNTER — Encounter: Payer: Self-pay | Admitting: Nurse Practitioner

## 2024-03-15 LAB — MEASLES/MUMPS/RUBELLA IMMUNITY
Mumps IgG: <9 [AU]/ml — ABNORMAL LOW
Rubella: 7.2 {index}
Rubeola IgG: 73.4 [AU]/ml

## 2024-03-29 ENCOUNTER — Ambulatory Visit: Admitting: Internal Medicine

## 2024-03-29 ENCOUNTER — Encounter: Payer: Self-pay | Admitting: Internal Medicine

## 2024-03-29 VITALS — BP 106/80 | HR 80 | Temp 98.5°F | Ht 65.0 in | Wt 185.2 lb

## 2024-03-29 DIAGNOSIS — N390 Urinary tract infection, site not specified: Secondary | ICD-10-CM | POA: Diagnosis not present

## 2024-03-29 LAB — POC URINALSYSI DIPSTICK (AUTOMATED)
Bilirubin, UA: NEGATIVE
Blood, UA: POSITIVE
Glucose, UA: NEGATIVE
Ketones, UA: NEGATIVE
Nitrite, UA: NEGATIVE
Protein, UA: POSITIVE — AB
Spec Grav, UA: 1.015 (ref 1.010–1.025)
Urobilinogen, UA: 0.2 U/dL
pH, UA: 6 (ref 5.0–8.0)

## 2024-03-29 MED ORDER — NITROFURANTOIN MONOHYD MACRO 100 MG PO CAPS
100.0000 mg | ORAL_CAPSULE | Freq: Two times a day (BID) | ORAL | 0 refills | Status: AC
Start: 2024-03-29 — End: 2024-04-03

## 2024-03-29 MED ORDER — PHENAZOPYRIDINE HCL 200 MG PO TABS
200.0000 mg | ORAL_TABLET | Freq: Three times a day (TID) | ORAL | 0 refills | Status: AC | PRN
Start: 2024-03-29 — End: ?

## 2024-03-29 NOTE — Progress Notes (Signed)
 [facility] [address] [place] [address] Dept: [phone] Dept Fax: [phone]  Acute Care Office Visit  Subjective:   Jillian Mcknight 20-Aug-1966 03/29/2024  Chief Complaint  Patient presents with   Urinary Frequency    Started Sunday comes and goes    HPI:  URINARY SYMPTOMS Onset: 5 days ago    Fever/chills: no Dysuria: yes Urinary frequency: yes Urgency: yes Foul odor: no Hematuria: no Suprapubic pain/pressure: yes Flank/low back pain: no Nausea/Vomiting: no  Treatments tried: advil    Previous urinary tract infection: yes    The following portions of the patient's history were reviewed and updated as appropriate: past medical history, past surgical history, family history, social history, allergies, medications, and problem list.   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   IBS (irritable bowel syndrome) 04/06/2013   Snoring 04/06/2013   Past Medical History:  Diagnosis Date   Allergy    Arthritis    knees   Carpal tunnel syndrome of right wrist    Depression    Headache, frequent episodic tension-type    Past Surgical History:  Procedure Laterality Date   BREAST BIOPSY Left 03/2021   CARPAL TUNNEL RELEASE Right 08/10/2016   Procedure: RIGHT CARPAL TUNNEL RELEASE;  Surgeon: Lyanne Sample, MD;  [facility];  Service: Orthopedics;  Laterality: Right;  FAB   ELBOW FRACTURE SURGERY Left  11/01/2018   WISDOM TOOTH EXTRACTION  1982   Family History  Problem Relation Age of Onset   Heart disease Mother    Stroke Mother    Lymphoma Mother    Alzheimer's disease Father    Bipolar disorder Sister    Colitis Daughter    Diabetes Paternal Grandmother    Cancer Cousin    Colon cancer Cousin 45       colon   Colon cancer Cousin 45       colon   Breast cancer Neg Hx     Current Outpatient Medications:    FLUoxetine  (PROZAC ) 10 MG tablet, Take 2 tablets (20 mg total) by mouth daily., Disp: 180 tablet, Rfl: 3   nitrofurantoin, macrocrystal-monohydrate, (MACROBID) 100 MG capsule, Take 1 capsule (100 mg total) by mouth 2 (two) times daily for 5 days., Disp: 10 capsule, Rfl: 0   phenazopyridine (PYRIDIUM) 200 MG tablet, Take 1 tablet (200 mg total) by mouth 3 (three) times daily as needed for pain., Disp: 10 tablet, Rfl: 0 Allergies  Allergen Reactions   Sulfamethoxazole-Trimethoprim     Other reaction(s): Abdominal Pain   Other    Sulfamethoxazole Nausea And Vomiting    Other reaction(s): GI Upset (intolerance)     ROS: A complete ROS was performed with pertinent positives/negatives noted in the HPI. The remainder of the ROS are negative.    Objective:   Today's Vitals   03/29/24 1444  BP: 106/80  Pulse: 80  Temp: 98.5 F (36.9 C)  TempSrc: Temporal  SpO2: 98%  Weight: 185 lb 3.2 oz (84 kg)  Height: 5' 5 (1.651  m)    GENERAL: Well-appearing, in NAD. Well nourished.  SKIN: Pink, warm and dry. No rash. RESPIRATORY: Chest wall symmetrical. Respirations even and non-labored. Breath sounds clear to auscultation bilaterally.  CARDIAC: S1, S2 present, regular rate and rhythm. Peripheral pulses 2+ bilaterally.  GI: Abdomen soft,suprapubic tenderness. Normoactive bowel sounds. No CVA tenderness.  EXTREMITIES: Without clubbing, cyanosis, or edema.  PSYCH/MENTAL STATUS: Alert, oriented x 3. Cooperative, appropriate mood and affect.    Results for orders placed or  performed in visit on 03/29/24  POCT Urinalysis Dipstick (Automated)  Result Value Ref Range   Color, UA yellow    Clarity, UA clear    Glucose, UA Negative Negative   Bilirubin, UA neg    Ketones, UA neg    Spec Grav, UA 1.015 1.010 - 1.025   Blood, UA positive    pH, UA 6.0 5.0 - 8.0   Protein, UA Positive (A) Negative   Urobilinogen, UA 0.2 0.2 or 1.0 E.U./dL   Nitrite, UA neg    Leukocytes, UA Large (3+) (A) Negative      Assessment & Plan:  1. Acute UTI (Primary) - Urine Culture - POCT Urinalysis Dipstick (Automated) - nitrofurantoin, macrocrystal-monohydrate, (MACROBID) 100 MG capsule; Take 1 capsule (100 mg total) by mouth 2 (two) times daily for 5 days.  Dispense: 10 capsule; Refill: 0 - phenazopyridine (PYRIDIUM) 200 MG tablet; Take 1 tablet (200 mg total) by mouth 3 (three) times daily as needed for pain.  Dispense: 10 tablet; Refill: 0  Meds ordered this encounter  Medications   nitrofurantoin, macrocrystal-monohydrate, (MACROBID) 100 MG capsule    Sig: Take 1 capsule (100 mg total) by mouth 2 (two) times daily for 5 days.    Dispense:  10 capsule    Refill:  0    Supervising Provider:   THOMPSON, AARON B [[id number]]   phenazopyridine (PYRIDIUM) 200 MG tablet    Sig: Take 1 tablet (200 mg total) by mouth 3 (three) times daily as needed for pain.    Dispense:  10 tablet    Refill:  0    Supervising Provider:   THOMPSON, AARON B [[id number]]   Orders Placed This Encounter  Procedures   Urine Culture   POCT Urinalysis Dipstick (Automated)   Lab Orders         Urine Culture         POCT Urinalysis Dipstick (Automated)     No images are attached to the encounter or orders placed in the encounter.  Return if symptoms worsen or fail to improve.   Gavin Kast, FNP

## 2024-03-31 LAB — URINE CULTURE
MICRO NUMBER:: 16572900
SPECIMEN QUALITY:: ADEQUATE

## 2024-04-03 ENCOUNTER — Telehealth: Payer: Self-pay | Admitting: Nurse Practitioner

## 2024-04-03 ENCOUNTER — Telehealth: Payer: Self-pay

## 2024-04-03 NOTE — Telephone Encounter (Signed)
 Copied from CRM [id number]. Topic: Clinical - Prescription Issue >> Apr 02, 2024  8:00 AM Mesmerise C wrote: Reason for CRM: Patient would like to get her medication changed for her UTI nitrofurantoin , macrocrystal-monohydrate, (MACROBID ) 100 MG capsule her symptoms haven't been progressing seeing if there's another medication that can be sent in please call patient back at [phone] to go over different medications.

## 2024-04-03 NOTE — Telephone Encounter (Signed)
 LVM for pt to call the office for appointment to reassess. 1020 available to with Dr. Hildy Lowers at the time of call, stated could not guarantee will still be available when pt cb.

## 2024-04-03 NOTE — Telephone Encounter (Signed)
 Patient returned call and would like a call back to further discuss. Informed of previous notation but stated she had another concern not related to being scheduled for re-assessment.

## 2024-04-03 NOTE — Telephone Encounter (Signed)
 Pt has called and wants a provider to review her culture results and advise what should happen. Raoul Byes has suggested an appt and 10:20 was offered today but pt would like a call.  [phone]

## 2024-04-03 NOTE — Telephone Encounter (Unsigned)
 Copied from CRM [id number]. Topic: Clinical - Lab/Test Results >> Apr 03, 2024  4:05 PM Jillian Mcknight wrote: Reason for CRM: *3rd request* Patient says she initially called Sunday to leave a message then twice today regarding provider reviewing her culture results and advise what should do next? Says she takes her last round of antibiotics today and she's leaving out of town Thursday morning. She requested a call today at [phone] (M)

## 2024-04-04 MED ORDER — CIPROFLOXACIN HCL 250 MG PO TABS: 250.0000 mg | ORAL_TABLET | Freq: Two times a day (BID) | ORAL | 0 refills | Status: AC

## 2024-04-04 NOTE — Telephone Encounter (Signed)
 Patient returned call and notified her of below message.

## 2024-04-04 NOTE — Telephone Encounter (Signed)
 Left message for patient to return call.

## 2024-04-04 NOTE — Addendum Note (Signed)
 Addended by: Huy Majid A on: 04/04/2024 08:34 AM   Modules accepted: Orders

## 2024-04-04 NOTE — Telephone Encounter (Signed)
 See previous telephone encounter.

## 2024-04-06 ENCOUNTER — Other Ambulatory Visit: Payer: Self-pay | Admitting: Family

## 2024-04-09 ENCOUNTER — Ambulatory Visit: Payer: Self-pay | Admitting: Nurse Practitioner

## 2024-04-09 MED ORDER — CIPROFLOXACIN HCL 250 MG PO TABS: 250.0000 mg | ORAL_TABLET | Freq: Two times a day (BID) | ORAL | 0 refills | Status: AC

## 2024-04-16 ENCOUNTER — Ambulatory Visit: Admitting: Family Medicine

## 2024-04-19 ENCOUNTER — Ambulatory Visit

## 2024-04-19 ENCOUNTER — Ambulatory Visit: Admitting: Family Medicine

## 2024-04-19 ENCOUNTER — Other Ambulatory Visit: Payer: Self-pay

## 2024-04-19 ENCOUNTER — Encounter: Payer: Self-pay | Admitting: Family Medicine

## 2024-04-19 ENCOUNTER — Other Ambulatory Visit (INDEPENDENT_AMBULATORY_CARE_PROVIDER_SITE_OTHER)

## 2024-04-19 VITALS — BP 122/82 | HR 72 | Ht 65.0 in | Wt 183.0 lb

## 2024-04-19 DIAGNOSIS — M79671 Pain in right foot: Secondary | ICD-10-CM | POA: Diagnosis not present

## 2024-04-19 DIAGNOSIS — M79672 Pain in left foot: Secondary | ICD-10-CM

## 2024-04-19 DIAGNOSIS — M5412 Radiculopathy, cervical region: Secondary | ICD-10-CM | POA: Diagnosis not present

## 2024-04-19 DIAGNOSIS — M25521 Pain in right elbow: Secondary | ICD-10-CM | POA: Diagnosis not present

## 2024-04-19 DIAGNOSIS — M25511 Pain in right shoulder: Secondary | ICD-10-CM

## 2024-04-19 DIAGNOSIS — G8929 Other chronic pain: Secondary | ICD-10-CM

## 2024-04-19 DIAGNOSIS — M79674 Pain in right toe(s): Secondary | ICD-10-CM | POA: Diagnosis not present

## 2024-04-19 LAB — URIC ACID: Uric Acid, Serum: 3.8 mg/dL (ref 2.4–7.0)

## 2024-04-19 MED ORDER — PREDNISONE 50 MG PO TABS
50.0000 mg | ORAL_TABLET | Freq: Every day | ORAL | 0 refills | Status: DC
Start: 2024-04-19 — End: 2024-04-30

## 2024-04-19 MED ORDER — GABAPENTIN 100 MG PO CAPS: 100.0000 mg | ORAL_CAPSULE | Freq: Three times a day (TID) | ORAL | 2 refills | Status: DC | PRN

## 2024-04-19 NOTE — Patient Instructions (Addendum)
 Thank you for coming in today.   Please get an Xray today before you leave   Please get labs today at [facility] - [address] bottom floor  A referral for physical therapy has been submitted. A representative from the physical therapy office will contact you to coordinate scheduling after confirming your benefits with your insurance provider. If you do not hear from the physical therapy office within the next 1-2 weeks, please let us  know.   Start Prednisone and Gabapentin  as directed by Dr. Joane.   Recommend trying a turf toe shoe  See you back in 8 weeks

## 2024-04-19 NOTE — Progress Notes (Addendum)
 I, Leotis Batter, CMA acting as a scribe for Artist Lloyd, MD.  Jillian Mcknight is a 58 y.o. female who presents to [facility] at [facility] today for R elbow, R shoulder, and bilat foot pain. Pt was previously seen by Dr. Lloyd on 12/24/21 for L knee pain.  Today, pt c/o R elbow and right shoulder pain x 3-4 months. Pt locates pain to lateral aspect of the elbow, dull ache. Locates shoulder pain to lateral aspect radiating into the upper arm. 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   Radiates: R shoulder down to the hand Paresthesia: R UE Grip strength: decreased Aggravates: computer work, yard work, house work Treatments tried: Astronomer, strengthening, IBU, Voltaren  Gel  Also c/o right great toe pain and left 3rd-5th digit pain. Denies n/t. Possible injury to the great toe.  She notes walking is painful at times.   Pertinent review of systems: No fevers or chills  Relevant historical information: Carpal tunnel syndrome anxiety.  No history of gout.   Exam:  BP 122/82   Pulse 72   Ht 5' 5 (1.651 m)   Wt 183 lb (83 kg)   LMP  (LMP Unknown)   SpO2 98%   BMI 30.45 kg/m  General: Well Developed, well nourished, and in no acute distress.   MSK: C-Spine: Normal appearing Nontender to palpation cervical midline. Decreased cervical motion. Upper extremity strength is intact. Reflexes are intact. Negative Spurling's test.  Right shoulder normal.  Normal motion intact strength negative impingement testing.  Right elbow normal-appearing nontender normal motion and strength.  No pain with resisted wrist extension or grip.  Right great toe some swelling visible.  Decreased range of motion especially to dorsiflexion.  Left foot normal.  Lab and Radiology Results  X-ray images cervical spine, left foot and right great toe obtained today personally and independently interpreted.  C-Spine: DDD worst at C6-7.   No acute fractures.  Right great toe: Mild DJD first MTP.  Left foot: Not much degenerative changes lateral forefoot.  Await formal radiology review     Assessment and Plan: 58 y.o. female with right arm pain most consistent with cervical radiculopathy either right C7 or right C8.  Plan for cervical spine x-ray today as well as physical therapy referral prednisone  and gabapentin .  Recheck with my partner Dr. Leonce in about 8 weeks.  If not improved consider MRI.  Right great toe pain: Patient does have some degenerative changes and clinically has some hallux rigidus.  Plan for turf toe insole.  Gout is a possibility.  Will check uric acid as well.  Left lateral toe pain: Unclear source of pain.  Triptil insult may be helpful as well.  Watchful waiting for this.  Addendum: Patient contacted me on April 25, 2024.  She initially proved with prednisone  but her symptoms have worsened again.  She is now experiencing some occasional weakness symptoms.  Will proceed to MRI.  PDMP not reviewed this encounter. Orders Placed This Encounter  Procedures   US  LIMITED JOINT SPACE STRUCTURES UP RIGHT(NO LINKED CHARGES)    Reason for Exam (SYMPTOM  OR DIAGNOSIS REQUIRED):   right elbow, right shoulder    Preferred imaging location?:    [facility] Cervical Spine 2 or 3 views    Standing Status:   Future    Number of Occurrences:   1    Expiration Date:   04/19/2025  Reason for Exam (SYMPTOM  OR DIAGNOSIS REQUIRED):   cervical radiculopathy    Is patient pregnant?:   No    Preferred imaging location?:   [facility]   DG Foot Complete Left    Standing Status:   Future    Number of Occurrences:   1    Expiration Date:   04/19/2025    Reason for Exam (SYMPTOM  OR DIAGNOSIS REQUIRED):   left foot pain    Preferred imaging location?:   [facility]    Is patient pregnant?:   No   DG Toe Great Right    Standing Status:   Future    Number of Occurrences:    1    Expiration Date:   05/20/2024    Reason for Exam (SYMPTOM  OR DIAGNOSIS REQUIRED):   R great toe pain    Preferred imaging location?:   [facility]    Is patient pregnant?:   No   Uric acid    Standing Status:   Future    Expiration Date:   04/19/2025   Ambulatory referral to Physical Therapy    Referral Priority:   Routine    Referral Type:   Physical Medicine    Referral Reason:   Specialty Services Required    Requested Specialty:   Physical Therapy    Number of Visits Requested:   1   Meds ordered this encounter  Medications   predniSONE  (DELTASONE ) 50 MG tablet    Sig: Take 1 tablet (50 mg total) by mouth daily with breakfast for 5 days.    Dispense:  5 tablet    Refill:  0   gabapentin  (NEURONTIN ) 100 MG capsule    Sig: Take 1-3 capsules (100-300 mg total) by mouth 3 (three) times daily as needed.    Dispense:  90 capsule    Refill:  2     Discussed warning signs or symptoms. Please see discharge instructions. Patient expresses understanding.   The above documentation has been reviewed and is accurate and complete Artist Lloyd, M.D.

## 2024-04-23 ENCOUNTER — Ambulatory Visit: Payer: Self-pay | Admitting: Family Medicine

## 2024-04-23 DIAGNOSIS — M5412 Radiculopathy, cervical region: Secondary | ICD-10-CM

## 2024-04-23 NOTE — Progress Notes (Signed)
 Left foot x-ray shows no broken bones.

## 2024-04-23 NOTE — Progress Notes (Signed)
 Cervical spine x-ray shows some arthritis in the base of the cervical spine at C6-7.

## 2024-04-23 NOTE — Progress Notes (Signed)
 Right big toe x-ray shows no broken bones.  They did not see a lot of arthritis.

## 2024-04-23 NOTE — Progress Notes (Signed)
 Uric acid level is normal.  Gout is very unlikely.

## 2024-04-26 NOTE — Telephone Encounter (Signed)
 Forwarding to Dr. Denyse Amass to review and advise.

## 2024-04-27 NOTE — Telephone Encounter (Signed)
 Forwarding to Dr. Denyse Amass to review and advise.

## 2024-04-30 MED ORDER — PREDNISONE 5 MG (48) PO TBPK: ORAL_TABLET | ORAL | 0 refills | Status: DC

## 2024-04-30 NOTE — Addendum Note (Signed)
 Addended by: JOANE ARTIST RAMAN on: 04/30/2024 07:06 AM   Modules accepted: Orders

## 2024-05-10 ENCOUNTER — Encounter: Payer: Self-pay | Admitting: Family Medicine

## 2024-05-11 NOTE — Telephone Encounter (Signed)
 Forwarding to Dr. Denyse Amass to review and advise.

## 2024-05-13 ENCOUNTER — Other Ambulatory Visit

## 2024-05-15 ENCOUNTER — Encounter: Payer: Self-pay | Admitting: Physical Therapy

## 2024-05-15 ENCOUNTER — Ambulatory Visit
Admission: RE | Admit: 2024-05-15 | Discharge: 2024-05-15 | Disposition: A | Discharge status: Home / Self Care | Source: Ambulatory Visit | Attending: Family Medicine | Admitting: Family Medicine

## 2024-05-15 ENCOUNTER — Other Ambulatory Visit: Payer: Self-pay

## 2024-05-15 ENCOUNTER — Ambulatory Visit: Attending: Family Medicine | Admitting: Physical Therapy

## 2024-05-15 DIAGNOSIS — M79672 Pain in left foot: Secondary | ICD-10-CM | POA: Diagnosis not present

## 2024-05-15 DIAGNOSIS — R293 Abnormal posture: Secondary | ICD-10-CM | POA: Diagnosis present

## 2024-05-15 DIAGNOSIS — M542 Cervicalgia: Secondary | ICD-10-CM | POA: Insufficient documentation

## 2024-05-15 DIAGNOSIS — M5412 Radiculopathy, cervical region: Secondary | ICD-10-CM | POA: Insufficient documentation

## 2024-05-15 DIAGNOSIS — M6281 Muscle weakness (generalized): Secondary | ICD-10-CM | POA: Insufficient documentation

## 2024-05-15 DIAGNOSIS — M79671 Pain in right foot: Secondary | ICD-10-CM | POA: Insufficient documentation

## 2024-05-15 DIAGNOSIS — M79601 Pain in right arm: Secondary | ICD-10-CM | POA: Diagnosis present

## 2024-05-15 DIAGNOSIS — R29898 Other symptoms and signs involving the musculoskeletal system: Secondary | ICD-10-CM | POA: Diagnosis present

## 2024-05-15 DIAGNOSIS — G8929 Other chronic pain: Secondary | ICD-10-CM | POA: Diagnosis not present

## 2024-05-15 DIAGNOSIS — M25521 Pain in right elbow: Secondary | ICD-10-CM | POA: Insufficient documentation

## 2024-05-15 DIAGNOSIS — M25511 Pain in right shoulder: Secondary | ICD-10-CM | POA: Insufficient documentation

## 2024-05-15 NOTE — Therapy (Signed)
 OUTPATIENT PHYSICAL THERAPY SHOULDER EVALUATION   Patient Name: Jillian Mcknight MRN: [id number] DOB:September 02, 1966, 58 y.o., female Today's Date: 05/15/2024  END OF SESSION:  PT End of Session - 05/15/24 0847     Visit Number 1    Date for PT Re-Evaluation 07/10/24    Authorization Type cigna    Authorization Time Period 05/15/24 to 07/10/24    PT Start Time 0845    PT Stop Time 0926    PT Time Calculation (min) 41 min    Activity Tolerance Patient tolerated treatment well    Behavior During Therapy [facility] for tasks assessed/performed          Past Medical History:  Diagnosis Date   Allergy    Arthritis    knees   Carpal tunnel syndrome of right wrist    Depression    Headache, frequent episodic tension-type    Past Surgical History:  Procedure Laterality Date   BREAST BIOPSY Left 03/2021   CARPAL TUNNEL RELEASE Right 08/10/2016   Procedure: RIGHT CARPAL TUNNEL RELEASE;  Surgeon: Arley Curia, MD;  Location: [facility];  Service: Orthopedics;  Laterality: Right;  FAB   ELBOW FRACTURE SURGERY Left 11/01/2018   WISDOM TOOTH EXTRACTION  1982   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   IBS (irritable bowel syndrome) 04/06/2013   Snoring 04/06/2013    PCP: Katheen Roselie Rockford NP   REFERRING PROVIDER: Joane Artist RAMAN, MD  REFERRING DIAG:  Diagnosis  M25.521 (ICD-10-CM) - Right elbow pain  M25.511,G89.29 (ICD-10-CM) - Chronic right shoulder pain  M54.12 (ICD-10-CM) - Cervical  radiculitis  M79.671,M79.672 (ICD-10-CM) - Bilateral foot pain    THERAPY DIAG:  Cervicalgia  Abnormal posture  Pain in right arm  Muscle weakness (generalized)  Other symptoms and signs involving the musculoskeletal system  Rationale for Evaluation and Treatment: Rehabilitation  ONSET DATE: acute on chronic- had similar issue in 2022, returned in May 2025  SUBJECTIVE:                                                                                                                                                                                      SUBJECTIVE STATEMENT:  I was here in 2022  with neck issues and radicular sx. Had carpal tunnel surgery already on R wrist in 2017, now pain is starting at elbow, arm is also feeling heavy and numb, I want to cut it off. Dr. Joane gave me prednisone  while helped while I was on it, but there was no taper and the first day off of it was terrible. Getting an MRI tonight, I know we need to try PT. Had success in the past with PT improving sx. Had been doing a lot of lifting and yardwork recently, also am on the computer 10 hours a day and eat my breakfast/lunch there too. 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 Dr. Joane did xrays on the foot, sometimes my big toe hurts like heck and sometimes its fine, he told me to ask about turf toe.   Hand dominance: Right  PERTINENT HISTORY: See above   PAIN:  Are you having pain? Yes: NPRS scale: 4/10 Pain location: top of head, neck, down into shoulder and elbow, numbness last 2 fingers and thumb but can be whole hand too  Pain description: heavy, sharp, dull, want to cut it off, numbness can be in whole hand sometimes kind of big and swollen  Aggravating factors: hard to say  Relieving factors: nothing   PRECAUTIONS: Other: hx hypermobility syndrome   RED FLAGS: None   WEIGHT BEARING RESTRICTIONS: No  FALLS:  Has patient fallen in last 6 months? 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  DIAGNOSTIC FINDINGS:   Uric acid level is normal.  Gout is very unlikely.     Right big toe x-ray shows no broken bones.  They did not see a lot of arthritis.     Left foot x-ray shows no broken bones.  Cervical spine x-ray shows some arthritis in the base of the cervical spine at C6-7  PATIENT SURVEYS:  PSFS: THE PATIENT SPECIFIC FUNCTIONAL SCALE  Place score of 0-10 (0 = unable to perform activity and 10 = able to perform activity at the same level as before injury or problem)  Activity Date: 05/15/24    Typing   7    2. Sitting comfortably  7    3. Lifting anything  5    4.  Gripping  3    Total Score 5.5      Total Score = Sum of activity scores/number of activities  Minimally Detectable Change: 3 points (for single activity); 2 points (for average score)  [facility] (nd). The Patient Specific Functional Scale . Retrieved from [email or web address]   COGNITION: Overall cognitive status: Within functional limits for tasks assessed     SENSATION: Numbness last 2 fingers, sometimes thumb sometimes whole hand   POSTURE: Rounded shoulders, forward head   UPPER EXTREMITY ROM:   Active ROM Right eval Left eval  Shoulder flexion WNL  WNL   Shoulder extension    Shoulder abduction WNL  WNL   Shoulder adduction    Shoulder internal rotation WNL  WNL   Shoulder external rotation WNL  WNL   Elbow flexion WNL  WNL   Elbow extension  WNL  WNL   Wrist flexion    Wrist  extension    Wrist ulnar deviation    Wrist radial deviation    Wrist pronation    Wrist supination    (Blank rows = not tested)  Cervical AROM flexion and extension WNL, R lateral flexion 70% limited, L lateral flexion 50% limited, cervical rotation WNL B   Thoracic AROM: flexion and extension WNL, R lateral flexion 25% limited, L lateral flexion 75% limited, R rotation 25% limited, L 50% limited   UPPER EXTREMITY MMT:  MMT Right eval Left eval  Shoulder flexion 4+ 4+  Shoulder extension    Shoulder abduction 4+ 4+  Shoulder adduction    Shoulder internal rotation 4 4-  Shoulder external rotation 4 4-  Middle trapezius    Lower trapezius    Elbow flexion    Elbow extension    Wrist flexion    Wrist extension    Wrist ulnar deviation    Wrist radial deviation    Wrist pronation    Wrist supination    Grip strength (lbs)    (Blank rows = not tested)  SHOULDER SPECIAL TESTS:  Empty can test negative B, no pain reaching OH that might indicate rotator cuff involvement     PALPATION:   Very tight and tender in R upper traps and cervical paraspinals/suboccipitals, noted palpable scapular winging at rest                                                                                                                              TREATMENT DATE:   05/15/24  Eval, POC, HEP practice and education as below   UBE L2 x3 min forward/3 min backward    PATIENT EDUCATION: Education details: exam findings, POC, HEP  Person educated: Patient Education method: Programmer, multimedia, Demonstration, and Handouts Education comprehension: verbalized understanding, returned demonstration, and needs further education  HOME EXERCISE PROGRAM:  Access Code: [email or web address] URL: [email or web address] Date: 05/15/2024 Prepared by: Josette Rough  Exercises - Scapular Retraction with Resistance  - 1 x daily - 7 x weekly - 2 sets - 10 reps - 2 seconds  hold - Shoulder extension with  resistance - Neutral  - 1 x daily - 7 x weekly - 2 sets - 10 reps - 2 seconds  hold - Seated Backward Shoulder Rolls  - 2-3 x daily - 7 x weekly - 1 sets - 20 reps - Seated Cervical Retraction  - 2-3 x daily - 7 x weekly - 1 sets - 10 reps - 3 seconds  hold  ASSESSMENT:  CLINICAL IMPRESSION: Patient is a 58 y.o. F who was seen today for physical therapy evaluation and treatment for  Diagnosis  M25.521 (ICD-10-CM) - Right elbow pain  M25.511,G89.29 (ICD-10-CM) - Chronic right shoulder pain  M54.12 (ICD-10-CM) - Cervical radiculitis  M79.671,M79.672 (ICD-10-CM) - Bilateral foot pain  .  Sounds like foot pain is in big toe and may be  able to be addressed with some custom inserts from [employer], gave her referral and location materials. Cervical/UE pain and impairments do seem directly related to likely radiculopathy- PT was very successful in handling this for her in the past, anticipate she will respond well. Will need a lot of focus on strengthening and NMR due to hx of hypermobility syndrome possibly influencing return of pain.   OBJECTIVE IMPAIRMENTS: decreased mobility, decreased ROM, hypomobility, increased fascial restrictions, increased muscle spasms, impaired UE functional use, improper body mechanics, postural dysfunction, and pain.   ACTIVITY LIMITATIONS: carrying, lifting, sleeping, bathing, toileting, dressing, reach over head, hygiene/grooming, and caring for others  PARTICIPATION LIMITATIONS: meal prep, cleaning, laundry, driving, shopping, community activity, occupation, and yard work  PERSONAL FACTORS: Age, Behavior pattern, Education, Fitness, Past/current experiences, Profession, Sex, Social background, and Time since onset of injury/illness/exacerbation are also affecting patient's functional outcome.   REHAB POTENTIAL: Good  CLINICAL DECISION MAKING: Stable/uncomplicated  EVALUATION COMPLEXITY: Low   GOALS: Goals reviewed with patient? No  SHORT TERM GOALS: Target  date: 06/12/2024    Will be compliant with appropriate progressive HEP  Baseline: Goal status: INITIAL  2.  Cervical and thoracic AROM to have normalized and will be pain free  Baseline:  Goal status: INITIAL  3.  Will demonstrate improved awareness of posture with all functional tasks  Baseline:  Goal status: INITIAL  4.  Noted mm spasms in neck and upper quarter to have resolved  Baseline:  Goal status: INITIAL    LONG TERM GOALS: Target date: 07/10/2024    MMT to be 5/5 all tested groups  Baseline:  Goal status: INITIAL  2.  Radicular sx to have resolved by at least 75% Baseline:  Goal status: INITIAL  3.  Pain in head/neck/shoulder/elbow to have improved by at least 75% Baseline:  Goal status: INITIAL  4.  Will express no difficulties with gripping/writing/typing  Baseline:  Goal status: INITIAL  5.  Will be able to exercise as desired without increase in pain  Baseline:  Goal status: INITIAL  6.  PSFS to have improved by at least 2 points  Baseline:  Goal status: INITIAL  PLAN:  PT FREQUENCY: 2x/week  PT DURATION: 8 weeks  PLANNED INTERVENTIONS: 97750- Physical Performance Testing, 97110-Therapeutic exercises, 97530- Therapeutic activity, [id number]- Neuromuscular re-education, 97535- Self Care, [id number]- Manual therapy, [id number]- Electrical stimulation (manual), [id number]- Ultrasound, 02987- Traction (mechanical), and 20560 (1-2 muscles), 20561 (3+ muscles)- Dry Needling  PLAN FOR NEXT SESSION: work on postural strengthening and retraining, UE strengthening, manual and consider DN (she is aware of extra cost for DN), traction as tolerated. She has TENS at home, encourage regular use   Josette Rough, PT, DPT 05/15/24 9:31 AM

## 2024-05-22 ENCOUNTER — Ambulatory Visit: Attending: Family Medicine | Admitting: Physical Therapy

## 2024-05-22 DIAGNOSIS — R29898 Other symptoms and signs involving the musculoskeletal system: Secondary | ICD-10-CM | POA: Diagnosis present

## 2024-05-22 DIAGNOSIS — M6281 Muscle weakness (generalized): Secondary | ICD-10-CM | POA: Insufficient documentation

## 2024-05-22 DIAGNOSIS — M542 Cervicalgia: Secondary | ICD-10-CM | POA: Diagnosis present

## 2024-05-22 DIAGNOSIS — R293 Abnormal posture: Secondary | ICD-10-CM | POA: Diagnosis present

## 2024-05-22 DIAGNOSIS — M79601 Pain in right arm: Secondary | ICD-10-CM | POA: Insufficient documentation

## 2024-05-22 NOTE — Therapy (Signed)
 OUTPATIENT PHYSICAL THERAPY SHOULDER EVALUATION   Patient Name: Jillian Mcknight MRN: [id number] DOB:07-Jun-1966, 58 y.o., female Today's Date: 05/22/2024  END OF SESSION:  PT End of Session - 05/22/24 0802     Visit Number 2    Date for PT Re-Evaluation 07/10/24    Authorization Type cigna    Authorization Time Period 05/15/24 to 07/10/24    PT Start Time 0800    PT Stop Time 0840    PT Time Calculation (min) 40 min          Past Medical History:  Diagnosis Date   Allergy    Arthritis    knees   Carpal tunnel syndrome of right wrist    Depression    Headache, frequent episodic tension-type    Past Surgical History:  Procedure Laterality Date   BREAST BIOPSY Left 03/2021   CARPAL TUNNEL RELEASE Right 08/10/2016   Procedure: RIGHT CARPAL TUNNEL RELEASE;  Surgeon: Arley Curia, MD;  [facility]:  [facility];  Service: Orthopedics;  Laterality: Right;  FAB   ELBOW FRACTURE SURGERY Left 11/01/2018   WISDOM TOOTH EXTRACTION  1982   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   IBS (irritable bowel syndrome) 04/06/2013   Snoring 04/06/2013    PCP: Katheen Roselie Rockford NP   REFERRING PROVIDER: Joane Artist RAMAN, MD  REFERRING DIAG:  Diagnosis  M25.521 (ICD-10-CM) - Right elbow pain  M25.511,G89.29 (ICD-10-CM) - Chronic right shoulder pain  M54.12 (ICD-10-CM) - Cervical radiculitis  M79.671,M79.672 (ICD-10-CM) - Bilateral foot pain    THERAPY DIAG:  Cervicalgia  Abnormal  posture  Pain in right arm  Muscle weakness (generalized)  Rationale for Evaluation and Treatment: Rehabilitation  ONSET DATE: acute on chronic- had similar issue in 2022, returned in May 2025  SUBJECTIVE:                                                                                                                                                                                      SUBJECTIVE STATEMENT:  Doing HEP. No MRI results yet. Massage Saturday  I was here in 2022 with neck issues and radicular sx. Had carpal tunnel surgery already on R wrist in 2017, now pain is  starting at elbow, arm is also feeling heavy and numb, I want to cut it off. Dr. Joane gave me prednisone  while helped while I was on it, but there was no taper and the first day off of it was terrible. Getting an MRI tonight, I know we need to try PT. Had success in the past with PT improving sx. Had been doing a lot of lifting and yardwork recently, also am on the computer 10 hours a day and eat my breakfast/lunch there too. 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 Dr. Joane did xrays on the foot, sometimes my big toe hurts like heck and sometimes its fine, he told me to ask about turf toe.   Hand dominance: Right  PERTINENT HISTORY: See above   PAIN:  Are you having pain? Yes: NPRS scale: 4/10 Pain location: top of head, neck, down into shoulder and elbow, numbness last 2 fingers and thumb but can be whole hand too  Pain description: heavy, sharp, dull, want to cut it off, numbness can be in whole hand sometimes kind of big and swollen  Aggravating factors: hard to say  Relieving factors: nothing   PRECAUTIONS: Other: hx hypermobility syndrome   RED FLAGS: None   WEIGHT BEARING RESTRICTIONS: No  FALLS:  Has patient fallen in last 6 months? 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  DIAGNOSTIC FINDINGS:   Uric acid level is normal.  Gout is very unlikely.     Right big toe x-ray shows no broken bones.  They did not see a lot of arthritis.     Left foot x-ray shows no broken bones.  Cervical spine x-ray shows some arthritis in the base of the cervical spine at C6-7  PATIENT SURVEYS:  PSFS: THE PATIENT SPECIFIC FUNCTIONAL SCALE  Place score of 0-10 (0 = unable to perform activity and 10 = able to perform activity at the same level as before injury or problem)  Activity Date: 05/15/24    Typing   7    2. Sitting comfortably  7    3. Lifting anything  5    4.  Gripping  3    Total Score 5.5      Total Score = Sum of activity scores/number of activities  Minimally Detectable Change: 3 points (for single activity); 2 points (for average score)  [facility] (nd). The Patient Specific Functional Scale . Retrieved from [email or web address]   COGNITION: Overall cognitive status: Within functional limits for tasks assessed     SENSATION: Numbness last 2 fingers, sometimes thumb sometimes whole hand   POSTURE: Rounded shoulders, forward head   UPPER EXTREMITY ROM:   Active ROM Right eval Left eval  Shoulder flexion WNL  WNL   Shoulder extension    Shoulder abduction WNL  WNL   Shoulder adduction    Shoulder internal rotation WNL  WNL   Shoulder external rotation WNL  WNL   Elbow flexion WNL  WNL   Elbow extension WNL  WNL   Wrist flexion    Wrist extension    Wrist ulnar deviation  Wrist radial deviation    Wrist pronation    Wrist supination    (Blank rows = not  tested)  Cervical AROM flexion and extension WNL, R lateral flexion 70% limited, L lateral flexion 50% limited, cervical rotation WNL B   Thoracic AROM: flexion and extension WNL, R lateral flexion 25% limited, L lateral flexion 75% limited, R rotation 25% limited, L 50% limited   UPPER EXTREMITY MMT:  MMT Right eval Left eval  Shoulder flexion 4+ 4+  Shoulder extension    Shoulder abduction 4+ 4+  Shoulder adduction    Shoulder internal rotation 4 4-  Shoulder external rotation 4 4-  Middle trapezius    Lower trapezius    Elbow flexion    Elbow extension    Wrist flexion    Wrist extension    Wrist ulnar deviation    Wrist radial deviation    Wrist pronation    Wrist supination    Grip strength (lbs)    (Blank rows = not tested)  SHOULDER SPECIAL TESTS:  Empty can test negative B, no pain reaching OH that might indicate rotator cuff involvement     PALPATION:   Very tight and tender in R upper traps and cervical paraspinals/suboccipitals, noted palpable scapular winging at rest                                                                                                                              TREATMENT DATE:   05/22/24 DSTW to RT cerv,trap and rhomboids- very tight Passive stretching cervical Mech traction 15 min KT tape to RT cerv- relax trap and help with TP in trap and rhomboids   05/15/24  Eval, POC, HEP practice and education as below   UBE L2 x3 min forward/3 min backward    PATIENT EDUCATION: Education details: exam findings, POC, HEP  Person educated: Patient Education method: Programmer, multimedia, Demonstration, and Handouts Education comprehension: verbalized understanding, returned demonstration, and needs further education  HOME EXERCISE PROGRAM:  Access Code: [email or web address] URL: [email or web address] Date: 05/15/2024 Prepared by: Josette Rough  Exercises - Scapular Retraction with Resistance  - 1 x daily - 7 x weekly - 2 sets -  10 reps - 2 seconds  hold - Shoulder extension with resistance - Neutral  - 1 x daily - 7 x weekly - 2 sets - 10 reps - 2 seconds  hold - Seated Backward Shoulder Rolls  - 2-3 x daily - 7 x weekly - 1 sets - 20 reps - Seated Cervical Retraction  - 2-3 x daily - 7 x weekly - 1 sets - 10 reps - 3 seconds  hold  ASSESSMENT:  CLINICAL IMPRESSION: pt arrives stating pain and numbness comes and goes. Verb doing HEP. No MRI results yet. Pt is very tight in RT cerv area- felt STW hurt so good relief in past with traction so did this as well as trial of KT tape     Patient  is a 58 y.o. F who was seen today for physical therapy evaluation and treatment for  Diagnosis  M25.521 (ICD-10-CM) - Right elbow pain  M25.511,G89.29 (ICD-10-CM) - Chronic right shoulder pain  M54.12 (ICD-10-CM) - Cervical radiculitis  M79.671,M79.672 (ICD-10-CM) - Bilateral foot pain  .  Sounds like foot pain is in big toe and may be able to be addressed with some custom inserts from [employer], gave her referral and location materials. Cervical/UE pain and impairments do seem directly related to likely radiculopathy- PT was very successful in handling this for her in the past, anticipate she will respond well. Will need a lot of focus on strengthening and NMR due to hx of hypermobility syndrome possibly influencing return of pain.   OBJECTIVE IMPAIRMENTS: decreased mobility, decreased ROM, hypomobility, increased fascial restrictions, increased muscle spasms, impaired UE functional use, improper body mechanics, postural dysfunction, and pain.   ACTIVITY LIMITATIONS: carrying, lifting, sleeping, bathing, toileting, dressing, reach over head, hygiene/grooming, and caring for others  PARTICIPATION LIMITATIONS: meal prep, cleaning, laundry, driving, shopping, community activity, occupation, and yard work  PERSONAL FACTORS: Age, Behavior pattern, Education, Fitness, Past/current experiences, Profession, Sex, Social background, and  Time since onset of injury/illness/exacerbation are also affecting patient's functional outcome.   REHAB POTENTIAL: Good  CLINICAL DECISION MAKING: Stable/uncomplicated  EVALUATION COMPLEXITY: Low   GOALS: Goals reviewed with patient? No  SHORT TERM GOALS: Target date: 06/12/2024    Will be compliant with appropriate progressive HEP  Baseline: Goal status: INITIAL  2.  Cervical and thoracic AROM to have normalized and will be pain free  Baseline:  Goal status: INITIAL  3.  Will demonstrate improved awareness of posture with all functional tasks  Baseline:  Goal status: INITIAL  4.  Noted mm spasms in neck and upper quarter to have resolved  Baseline:  Goal status: INITIAL    LONG TERM GOALS: Target date: 07/10/2024    MMT to be 5/5 all tested groups  Baseline:  Goal status: INITIAL  2.  Radicular sx to have resolved by at least 75% Baseline:  Goal status: INITIAL  3.  Pain in head/neck/shoulder/elbow to have improved by at least 75% Baseline:  Goal status: INITIAL  4.  Will express no difficulties with gripping/writing/typing  Baseline:  Goal status: INITIAL  5.  Will be able to exercise as desired without increase in pain  Baseline:  Goal status: INITIAL  6.  PSFS to have improved by at least 2 points  Baseline:  Goal status: INITIAL  PLAN:  PT FREQUENCY: 2x/week  PT DURATION: 8 weeks  PLANNED INTERVENTIONS: 97750- Physical Performance Testing, 97110-Therapeutic exercises, 97530- Therapeutic activity, [id number]- Neuromuscular re-education, 97535- Self Care, 02859- Manual therapy, [id number]- Electrical stimulation (manual), [id number]- Ultrasound, 02987- Traction (mechanical), and 20560 (1-2 muscles), 20561 (3+ muscles)- Dry Needling  PLAN FOR NEXT SESSION: work on postural strengthening and retraining, UE strengthening, manual and consider DN (she is aware of extra cost for DN), traction as tolerated. She has TENS at home, encourage regular use   Jon Or  PTA 05/22/24 8:38 AM

## 2024-05-24 ENCOUNTER — Ambulatory Visit: Admitting: Physical Therapy

## 2024-05-24 ENCOUNTER — Encounter: Payer: Self-pay | Admitting: Physical Therapy

## 2024-05-24 ENCOUNTER — Ambulatory Visit: Payer: Self-pay | Admitting: Sports Medicine

## 2024-05-24 ENCOUNTER — Telehealth: Payer: Self-pay | Admitting: Family Medicine

## 2024-05-24 DIAGNOSIS — M6281 Muscle weakness (generalized): Secondary | ICD-10-CM

## 2024-05-24 DIAGNOSIS — R293 Abnormal posture: Secondary | ICD-10-CM

## 2024-05-24 DIAGNOSIS — M542 Cervicalgia: Secondary | ICD-10-CM | POA: Diagnosis not present

## 2024-05-24 DIAGNOSIS — M79601 Pain in right arm: Secondary | ICD-10-CM

## 2024-05-24 DIAGNOSIS — M5412 Radiculopathy, cervical region: Secondary | ICD-10-CM

## 2024-05-24 NOTE — Telephone Encounter (Signed)
 Jillian Mcknight pt, would like Cervical MRI reviewed (resulted 05/22/2024). Per pt, OK to respond via MyChart.

## 2024-05-24 NOTE — Therapy (Signed)
 OUTPATIENT PHYSICAL THERAPY SHOULDER EVALUATION   Patient Name: Jillian Mcknight MRN: [id number] DOB:[birth year], 58 y.o., female Today's Date: 05/24/2024  END OF SESSION:  PT End of Session - 05/24/24 0803     Visit Number 3    Date for PT Re-Evaluation 07/10/24    PT Start Time 0801    PT Stop Time 0845    PT Time Calculation (min) 44 min    Activity Tolerance Patient tolerated treatment well    Behavior During Therapy [facility] for tasks assessed/performed          Past Medical History:  Diagnosis Date   Allergy    Arthritis    knees   Carpal tunnel syndrome of right wrist    Depression    Headache, frequent episodic tension-type    Past Surgical History:  Procedure Laterality Date   BREAST BIOPSY Left 03/2021   CARPAL TUNNEL RELEASE Right 08/10/2016   Procedure: RIGHT CARPAL TUNNEL RELEASE;  Surgeon: Arley Curia, MD;  Location: [facility];  Service: Orthopedics;  Laterality: Right;  FAB   ELBOW FRACTURE SURGERY Left 11/01/2018   WISDOM TOOTH EXTRACTION  1982   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   IBS (irritable bowel syndrome) 04/06/2013   Snoring 04/06/2013    PCP: Katheen Roselie Rockford NP   REFERRING PROVIDER: Joane Artist RAMAN, MD  REFERRING DIAG:  Diagnosis  M25.521 (ICD-10-CM) - Right elbow pain  M25.511,G89.29 (ICD-10-CM) - Chronic right shoulder pain  M54.12 (ICD-10-CM) - Cervical radiculitis  M79.671,M79.672 (ICD-10-CM) - Bilateral foot pain    THERAPY DIAG:   Cervicalgia  Abnormal posture  Pain in right arm  Muscle weakness (generalized)  Rationale for Evaluation and Treatment: Rehabilitation  ONSET DATE: acute on chronic- had similar issue in 2022, returned in May 2025  SUBJECTIVE:                                                                                                                                                                                      SUBJECTIVE STATEMENT:  MRI results came back, MRI shows some moderate to 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 Had carpal  tunnel surgery already on R wrist in 2017, now pain is starting at elbow, arm is also feeling heavy and numb, I want to cut it off. Dr. Joane gave me prednisone  while helped while I was on it, but there was no taper and the first day off of it was terrible. Getting an MRI tonight, I know we need to try PT. Had success in the past with PT improving sx. Had been doing a lot of lifting and yardwork recently, also am on the computer 10 hours a day and eat my breakfast/lunch there too. 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 Dr. Joane did xrays on the foot, sometimes my big toe hurts like heck and sometimes its fine, he told me to ask about turf toe.   Hand dominance: Right  PERTINENT HISTORY: See above   PAIN:  Are you having pain? Yes: NPRS scale: 3/10 Pain location: top of head, neck, down into shoulder and elbow, numbness last 2 fingers and thumb but can be whole hand too  Pain description: heavy, sharp, dull, want to cut it off, numbness can be in whole hand sometimes kind of big and swollen  Aggravating factors: hard to say  Relieving factors: nothing   PRECAUTIONS: Other: hx hypermobility syndrome   RED FLAGS: None   WEIGHT BEARING RESTRICTIONS: No  FALLS:  Has patient fallen in last 6 months? 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  DIAGNOSTIC FINDINGS:   Uric acid level is normal.  Gout is very unlikely.     Right big toe x-ray shows no broken bones.  They did not see a lot of arthritis.     Left foot x-ray shows no broken bones.  Cervical spine x-ray shows some arthritis in the base of the cervical spine at C6-7  PATIENT SURVEYS:  PSFS: THE PATIENT SPECIFIC FUNCTIONAL SCALE  Place score of 0-10 (0 = unable to perform activity and 10 = able to perform activity at the same level as before injury or problem)  Activity Date: 05/15/24    Typing   7    2. Sitting comfortably  7    3. Lifting anything  5    4.  Gripping  3    Total Score 5.5      Total Score = Sum of activity scores/number of activities  Minimally Detectable Change: 3 points (for single activity); 2 points (for average score)  [facility] (nd). The Patient Specific Functional Scale . Retrieved from [email or web address]   COGNITION: Overall cognitive status: Within functional limits for tasks assessed     SENSATION: Numbness last 2 fingers, sometimes thumb sometimes whole hand   POSTURE: Rounded shoulders, forward head   UPPER EXTREMITY ROM:   Active ROM Right eval Left eval  Shoulder flexion WNL  WNL   Shoulder extension    Shoulder abduction WNL  WNL   Shoulder adduction    Shoulder internal rotation WNL  WNL   Shoulder external rotation WNL  WNL   Elbow flexion WNL  WNL   Elbow extension WNL  WNL   Wrist flexion  Wrist extension    Wrist ulnar deviation    Wrist radial deviation     Wrist pronation    Wrist supination    (Blank rows = not tested)  Cervical AROM flexion and extension WNL, R lateral flexion 70% limited, L lateral flexion 50% limited, cervical rotation WNL B   Thoracic AROM: flexion and extension WNL, R lateral flexion 25% limited, L lateral flexion 75% limited, R rotation 25% limited, L 50% limited   UPPER EXTREMITY MMT:  MMT Right eval Left eval  Shoulder flexion 4+ 4+  Shoulder extension    Shoulder abduction 4+ 4+  Shoulder adduction    Shoulder internal rotation 4 4-  Shoulder external rotation 4 4-  Middle trapezius    Lower trapezius    Elbow flexion    Elbow extension    Wrist flexion    Wrist extension    Wrist ulnar deviation    Wrist radial deviation    Wrist pronation    Wrist supination    Grip strength (lbs)    (Blank rows = not tested)  SHOULDER SPECIAL TESTS:  Empty can test negative B, no pain reaching OH that might indicate rotator cuff involvement     PALPATION:   Very tight and tender in R upper traps and cervical paraspinals/suboccipitals, noted palpable scapular winging at rest                                                                                                                              TREATMENT DATE:  05/24/24 Educated Pt on MRI diagnosis UBE L1.5 x 2 min each Rows & Ext red 2x10 Shoulder ER red 2x10 DSTW to RT cerv,trap and rhomboids- very tight Passive stretching cervical KT tape to RT cerv- relax trap and help with TP in trap and rhomboids  05/22/24 DSTW to RT cerv,trap and rhomboids- very tight Passive stretching cervical Mech traction 15 min KT tape to RT cerv- relax trap and help with TP in trap and rhomboids   05/15/24  Eval, POC, HEP practice and education as below   UBE L2 x3 min forward/3 min backward    PATIENT EDUCATION: Education details: exam findings, POC, HEP  Person educated: Patient Education method: Programmer, multimedia, Demonstration, and Handouts Education  comprehension: verbalized understanding, returned demonstration, and needs further education  HOME EXERCISE PROGRAM:  Access Code: [email or web address] URL: [email or web address] Date: 05/15/2024 Prepared by: Josette Rough  Exercises - Scapular Retraction with Resistance  - 1 x daily - 7 x weekly - 2 sets - 10 reps - 2 seconds  hold - Shoulder extension with resistance - Neutral  - 1 x daily - 7 x weekly - 2 sets - 10 reps - 2 seconds  hold - Seated Backward Shoulder Rolls  - 2-3 x daily - 7 x weekly - 1 sets - 20 reps - Seated Cervical Retraction  - 2-3 x daily - 7 x weekly - 1 sets - 10 reps -  3 seconds  hold  ASSESSMENT:  CLINICAL IMPRESSION: pt arrives stating with MRI results. Educated on the diagnosis and explained PT's approach. Verb doing HEP. Introduced pt to some light postural strengthening interventions without issue. Pt is very tight in RT cerv area- felt STW hurt so good. Improved tissue elasticity after MT.      Patient is a 58 y.o. F who was seen today for physical therapy evaluation and treatment for  Diagnosis  M25.521 (ICD-10-CM) - Right elbow pain  M25.511,G89.29 (ICD-10-CM) - Chronic right shoulder pain  M54.12 (ICD-10-CM) - Cervical radiculitis  M79.671,M79.672 (ICD-10-CM) - Bilateral foot pain  .  Sounds like foot pain is in big toe and may be able to be addressed with some custom inserts from [employer], gave her referral and location materials. Cervical/UE pain and impairments do seem directly related to likely radiculopathy- PT was very successful in handling this for her in the past, anticipate she will respond well. Will need a lot of focus on strengthening and NMR due to hx of hypermobility syndrome possibly influencing return of pain.   OBJECTIVE IMPAIRMENTS: decreased mobility, decreased ROM, hypomobility, increased fascial restrictions, increased muscle spasms, impaired UE functional use, improper body mechanics, postural dysfunction, and pain.    ACTIVITY LIMITATIONS: carrying, lifting, sleeping, bathing, toileting, dressing, reach over head, hygiene/grooming, and caring for others  PARTICIPATION LIMITATIONS: meal prep, cleaning, laundry, driving, shopping, community activity, occupation, and yard work  PERSONAL FACTORS: Age, Behavior pattern, Education, Fitness, Past/current experiences, Profession, Sex, Social background, and Time since onset of injury/illness/exacerbation are also affecting patient's functional outcome.   REHAB POTENTIAL: Good  CLINICAL DECISION MAKING: Stable/uncomplicated  EVALUATION COMPLEXITY: Low   GOALS: Goals reviewed with patient? No  SHORT TERM GOALS: Target date: 06/12/2024    Will be compliant with appropriate progressive HEP  Baseline: Goal status: Met 05/24/24  2.  Cervical and thoracic AROM to have normalized and will be pain free  Baseline:  Goal status: INITIAL  3.  Will demonstrate improved awareness of posture with all functional tasks  Baseline:  Goal status: INITIAL  4.  Noted mm spasms in neck and upper quarter to have resolved  Baseline:  Goal status: INITIAL    LONG TERM GOALS: Target date: 07/10/2024    MMT to be 5/5 all tested groups  Baseline:  Goal status: INITIAL  2.  Radicular sx to have resolved by at least 75% Baseline:  Goal status: INITIAL  3.  Pain in head/neck/shoulder/elbow to have improved by at least 75% Baseline:  Goal status: INITIAL  4.  Will express no difficulties with gripping/writing/typing  Baseline:  Goal status: INITIAL  5.  Will be able to exercise as desired without increase in pain  Baseline:  Goal status: INITIAL  6.  PSFS to have improved by at least 2 points  Baseline:  Goal status: INITIAL  PLAN:  PT FREQUENCY: 2x/week  PT DURATION: 8 weeks  PLANNED INTERVENTIONS: 97750- Physical Performance Testing, 97110-Therapeutic exercises, 97530- Therapeutic activity, [id number]- Neuromuscular re-education, 97535- Self Care, 02859-  Manual therapy, [id number]- Electrical stimulation (manual), [id number]- Ultrasound, 02987- Traction (mechanical), and 20560 (1-2 muscles), 20561 (3+ muscles)- Dry Needling  PLAN FOR NEXT SESSION: work on postural strengthening and retraining, UE strengthening, manual and consider DN (she is aware of extra cost for DN), traction as tolerated. She has TENS at home, encourage regular use   Tanda Sorrow, PTA 05/24/24 8:04 AM

## 2024-05-25 NOTE — Telephone Encounter (Signed)
 Dr. Leonce reviewed and responded to patient via MyChart.

## 2024-05-29 ENCOUNTER — Encounter: Payer: Self-pay | Admitting: Physical Therapy

## 2024-05-29 ENCOUNTER — Ambulatory Visit: Admitting: Physical Therapy

## 2024-05-29 DIAGNOSIS — R29898 Other symptoms and signs involving the musculoskeletal system: Secondary | ICD-10-CM

## 2024-05-29 DIAGNOSIS — M542 Cervicalgia: Secondary | ICD-10-CM

## 2024-05-29 DIAGNOSIS — M6281 Muscle weakness (generalized): Secondary | ICD-10-CM

## 2024-05-29 DIAGNOSIS — R293 Abnormal posture: Secondary | ICD-10-CM

## 2024-05-29 DIAGNOSIS — M79601 Pain in right arm: Secondary | ICD-10-CM

## 2024-05-29 NOTE — Therapy (Signed)
 OUTPATIENT PHYSICAL THERAPY SHOULDER TREATMENT   Patient Name: Jillian Mcknight MRN: [id number] DOB:12/15/1965, 58 y.o., female Today's Date: 05/29/2024  END OF SESSION:  PT End of Session - 05/29/24 0801     Visit Number 4    Date for PT Re-Evaluation 07/10/24    Authorization Type cigna    PT Start Time 0800    PT Stop Time 0856    PT Time Calculation (min) 56 min    Activity Tolerance Patient tolerated treatment well    Behavior During Therapy [facility] for tasks assessed/performed          Past Medical History:  Diagnosis Date   Allergy    Arthritis    knees   Carpal tunnel syndrome of right wrist    Depression    Headache, frequent episodic tension-type    Past Surgical History:  Procedure Laterality Date   BREAST BIOPSY Left 03/2021   CARPAL TUNNEL RELEASE Right 08/10/2016   Procedure: RIGHT CARPAL TUNNEL RELEASE;  Surgeon: Arley Curia, MD;  Location: [facility];  Service: Orthopedics;  Laterality: Right;  FAB   ELBOW FRACTURE SURGERY Left 11/01/2018   WISDOM TOOTH EXTRACTION  1982   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   IBS (irritable bowel syndrome) 04/06/2013   Snoring 04/06/2013    PCP: Katheen Roselie Rockford NP   REFERRING PROVIDER: Joane Artist RAMAN, MD  REFERRING DIAG:  Diagnosis  M25.521 (ICD-10-CM) - Right elbow pain  M25.511,G89.29 (ICD-10-CM) - Chronic right shoulder pain  M54.12 (ICD-10-CM) - Cervical radiculitis  M79.671,M79.672 (ICD-10-CM) - Bilateral  foot pain    THERAPY DIAG:  Cervicalgia  Abnormal posture  Pain in right arm  Muscle weakness (generalized)  Other symptoms and signs involving the musculoskeletal system  Rationale for Evaluation and Treatment: Rehabilitation  ONSET DATE: acute on chronic- had similar issue in 2022, returned in May 2025  SUBJECTIVE:                                                                                                                                                                                      SUBJECTIVE STATEMENT:  MRI results came back, MRI shows some moderate to severe degeneration in the cervical  spine.  I have an appointment in the future to see Dr. Leonce, possible injection.  I was here in 2022 with neck issues and radicular sx. Had carpal tunnel surgery already on R wrist in 2017, now pain is starting at elbow, arm is also feeling heavy and numb, I want to cut it off. Dr. Joane gave me prednisone  while helped while I was on it, but there was no taper and the first day off of it was terrible. Getting an MRI tonight, I know we need to try PT. Had success in the past with PT improving sx. Had been doing a lot of lifting and yardwork recently, also am on the computer 10 hours a day and eat my breakfast/lunch there too. 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 Dr. Joane did xrays on the foot, sometimes my big toe hurts like heck and sometimes its fine, he told me to ask about turf toe.   Hand dominance: Right  PERTINENT HISTORY: See above   PAIN:  Are you having pain? Yes: NPRS scale: 3/10 Pain location: top of head, neck, down into shoulder and elbow, numbness last 2 fingers and thumb but can be whole hand too  Pain description: heavy, sharp, dull, want to cut it off, numbness can be in whole hand sometimes kind of big and swollen  Aggravating factors: hard to say  Relieving factors: nothing   PRECAUTIONS: Other: hx hypermobility syndrome   RED  FLAGS: None   WEIGHT BEARING RESTRICTIONS: No  FALLS:  Has patient fallen in last 6 months? 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  DIAGNOSTIC FINDINGS:   Uric acid level is normal.  Gout is very unlikely.     Right big toe x-ray shows no broken bones.  They did not see a lot of arthritis.     Left foot x-ray shows no broken bones.  Cervical spine x-ray shows some arthritis in the base of the cervical spine at C6-7  PATIENT SURVEYS:  PSFS: THE PATIENT SPECIFIC FUNCTIONAL SCALE  Place score of 0-10 (0 = unable to perform activity and 10 = able to perform activity at the same level as before injury or problem)  Activity Date: 05/15/24    Typing   7    2. Sitting comfortably  7    3. Lifting anything  5    4.  Gripping  3    Total Score 5.5      Total Score = Sum of activity scores/number of activities  Minimally Detectable Change: 3 points (for single activity); 2 points (for average score)  [facility] (nd). The Patient Specific Functional Scale . Retrieved from [email or web address]   COGNITION: Overall cognitive status: Within functional limits for tasks assessed     SENSATION: Numbness last 2 fingers, sometimes thumb sometimes whole hand   POSTURE: Rounded shoulders, forward head   UPPER EXTREMITY ROM:   Active ROM Right eval Left eval  Shoulder flexion WNL  WNL   Shoulder extension    Shoulder abduction WNL  WNL   Shoulder adduction    Shoulder internal rotation WNL  WNL    Shoulder external rotation WNL  WNL   Elbow flexion WNL  WNL   Elbow extension WNL  WNL   Wrist flexion    Wrist extension    Wrist ulnar deviation    Wrist radial deviation    Wrist pronation    Wrist supination    (Blank rows = not tested)  Cervical AROM flexion and extension WNL, R lateral flexion 70% limited, L lateral flexion 50% limited, cervical rotation WNL B   Thoracic AROM: flexion and extension WNL, R lateral flexion 25% limited, L lateral flexion 75% limited, R rotation 25% limited, L 50% limited   UPPER EXTREMITY MMT:  MMT Right eval Left eval  Shoulder flexion 4+ 4+  Shoulder extension    Shoulder abduction 4+ 4+  Shoulder adduction    Shoulder internal rotation 4 4-  Shoulder external rotation 4 4-  Middle trapezius    Lower trapezius    Elbow flexion    Elbow extension    Wrist flexion    Wrist extension    Wrist ulnar deviation    Wrist radial deviation    Wrist pronation    Wrist supination    Grip strength (lbs)    (Blank rows = not tested)  SHOULDER SPECIAL TESTS:  Empty can test negative B, no pain reaching OH that might indicate rotator cuff involvement     PALPATION:   Very tight and tender in R upper traps and cervical paraspinals/suboccipitals, noted palpable scapular winging at rest                                                                                                                              TREATMENT DATE:  05/29/24 UBE Level 3 x 5 minutes Seated rows 15# vc, tc's Lats 15# vc's and tc's Red tband horizontal abduction 5# shrugs with upper trap and levator stretches Back to wall weighted ball OHP STM to the right upper trap, neck and rhomboid Occipital release, manual shoulder depression, some gentle cervical stretches' Cervical traction 13#  05/24/24 Educated Pt on MRI diagnosis UBE L1.5 x 2 min each Rows & Ext red 2x10 Shoulder ER red 2x10 DSTW to RT cerv,trap and rhomboids- very tight Passive stretching  cervical KT tape to RT cerv- relax trap and help with TP in trap and rhomboids  05/22/24 DSTW to RT cerv,trap and rhomboids- very tight Passive stretching cervical Mech traction 15 min KT tape to RT cerv- relax trap and help with TP in trap and rhomboids   05/15/24  Eval, POC, HEP practice and education as below   UBE L2 x3 min forward/3 min backward    PATIENT EDUCATION: Education details: exam findings, POC, HEP  Person educated: Patient Education method: Programmer, multimedia, Demonstration, and Handouts Education comprehension: verbalized understanding, returned demonstration, and needs further education  HOME EXERCISE PROGRAM:  Access Code: [email or web address] URL: [email or web address] Date: 05/15/2024 Prepared by: Josette Rough  Exercises - Scapular Retraction with Resistance  -  1 x daily - 7 x weekly - 2 sets - 10 reps - 2 seconds  hold - Shoulder extension with resistance - Neutral  - 1 x daily - 7 x weekly - 2 sets - 10 reps - 2 seconds  hold - Seated Backward Shoulder Rolls  - 2-3 x daily - 7 x weekly - 1 sets - 20 reps - Seated Cervical Retraction  - 2-3 x daily - 7 x weekly - 1 sets - 10 reps - 3 seconds  hold  ASSESSMENT:  CLINICAL IMPRESSION: Patient with the results of MRI showing,  IMPRESSION: 1. 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 2. Moderate right foraminal stenosis at C5-C6 secondary to uncovertebral joint disease. 3. No signal abnormality of the cord. We discussed the results and talked about the plan of Dr. Leonce to do the steroid injection.  We talked about the need for posture and mm relaxation as well.  We did STM and some gentle occipital release with some passive stretches.  I did continue with the traction.    Patient is a 58 y.o. F who was seen today for physical therapy evaluation and treatment for  Diagnosis  M25.521 (ICD-10-CM) - Right elbow pain  M25.511,G89.29 (ICD-10-CM) - Chronic right  shoulder pain  M54.12 (ICD-10-CM) - Cervical radiculitis  M79.671,M79.672 (ICD-10-CM) - Bilateral foot pain  .  Sounds like foot pain is in big toe and may be able to be addressed with some custom inserts from [employer], gave her referral and location materials. Cervical/UE pain and impairments do seem directly related to likely radiculopathy- PT was very successful in handling this for her in the past, anticipate she will respond well. Will need a lot of focus on strengthening and NMR due to hx of hypermobility syndrome possibly influencing return of pain.   OBJECTIVE IMPAIRMENTS: decreased mobility, decreased ROM, hypomobility, increased fascial restrictions, increased muscle spasms, impaired UE functional use, improper body mechanics, postural dysfunction, and pain.   ACTIVITY LIMITATIONS: carrying, lifting, sleeping, bathing, toileting, dressing, reach over head, hygiene/grooming, and caring for others  PARTICIPATION LIMITATIONS: meal prep, cleaning, laundry, driving, shopping, community activity, occupation, and yard work  PERSONAL FACTORS: Age, Behavior pattern, Education, Fitness, Past/current experiences, Profession, Sex, Social background, and Time since onset of injury/illness/exacerbation are also affecting patient's functional outcome.   REHAB POTENTIAL: Good  CLINICAL DECISION MAKING: Stable/uncomplicated  EVALUATION COMPLEXITY: Low   GOALS: Goals reviewed with patient? No  SHORT TERM GOALS: Target date: 06/12/2024    Will be compliant with appropriate progressive HEP  Baseline: Goal status: Met 05/24/24  2.  Cervical and thoracic AROM to have normalized and will be pain free  Baseline:  Goal status: progressing 05/29/24  3.  Will demonstrate improved awareness of posture with all functional tasks  Baseline:  Goal status: met 05/29/24  4.  Noted mm spasms in neck and upper quarter to have resolved  Baseline:  Goal status: INITIAL    LONG TERM GOALS: Target date:  07/10/2024    MMT to be 5/5 all tested groups  Baseline:  Goal status: INITIAL  2.  Radicular sx to have resolved by at least 75% Baseline:  Goal status: INITIAL  3.  Pain in head/neck/shoulder/elbow to have improved by at least 75% Baseline:  Goal status: INITIAL  4.  Will express no difficulties with gripping/writing/typing  Baseline:  Goal status: INITIAL  5.  Will be able to exercise as desired without increase in pain  Baseline:  Goal status: INITIAL  6.  PSFS to have improved by at least 2 points  Baseline:  Goal status: INITIAL  PLAN:  PT FREQUENCY: 2x/week  PT DURATION: 8 weeks  PLANNED INTERVENTIONS: 97750- Physical Performance Testing, 97110-Therapeutic exercises, 97530- Therapeutic activity, [id number]- Neuromuscular re-education, 97535- Self Care, [id number]- Manual therapy, [id number]- Electrical stimulation (manual), [id number]- Ultrasound, 02987- Traction (mechanical), and 20560 (1-2 muscles), 20561 (3+ muscles)- Dry Needling  PLAN FOR NEXT SESSION: work on postural strengthening and retraining, UE strengthening, manual and consider DN (she is aware of extra cost for DN), traction as tolerated. She has TENS at home, encourage regular use  We will continue plan at least until she sees Dr. Leonce Ozell Mainland, PT 05/29/24 8:37 AM

## 2024-06-04 ENCOUNTER — Other Ambulatory Visit: Payer: Self-pay | Admitting: Nurse Practitioner

## 2024-06-04 DIAGNOSIS — Z1231 Encounter for screening mammogram for malignant neoplasm of breast: Secondary | ICD-10-CM

## 2024-06-05 ENCOUNTER — Ambulatory Visit: Admitting: Physical Therapy

## 2024-06-05 DIAGNOSIS — R293 Abnormal posture: Secondary | ICD-10-CM

## 2024-06-05 DIAGNOSIS — M542 Cervicalgia: Secondary | ICD-10-CM

## 2024-06-05 DIAGNOSIS — M6281 Muscle weakness (generalized): Secondary | ICD-10-CM

## 2024-06-05 DIAGNOSIS — M79601 Pain in right arm: Secondary | ICD-10-CM

## 2024-06-05 NOTE — Therapy (Signed)
 OUTPATIENT PHYSICAL THERAPY SHOULDER TREATMENT   Patient Name: Jillian Mcknight MRN: [id number] DOB:05-09-1966, 58 y.o., female Today's Date: 06/05/2024  END OF SESSION:  PT End of Session - 06/05/24 0800     Visit Number 5    Date for PT Re-Evaluation 07/10/24    Authorization Type cigna    Authorization Time Period 05/15/24 to 07/10/24    PT Start Time 0800    PT Stop Time 0850    PT Time Calculation (min) 50 min          Past Medical History:  Diagnosis Date   Allergy    Arthritis    knees   Carpal tunnel syndrome of right wrist    Depression    Headache, frequent episodic tension-type    Past Surgical History:  Procedure Laterality Date   BREAST BIOPSY Left 03/2021   CARPAL TUNNEL RELEASE Right 08/10/2016   Procedure: RIGHT CARPAL TUNNEL RELEASE;  Surgeon: Arley Curia, MD;  Location: [facility];  Service: Orthopedics;  Laterality: Right;  FAB   ELBOW FRACTURE SURGERY Left 11/01/2018   WISDOM TOOTH EXTRACTION  1982   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   IBS (irritable bowel syndrome) 04/06/2013   Snoring 04/06/2013    PCP: Katheen Roselie Rockford NP   REFERRING PROVIDER: Joane Artist RAMAN, MD  REFERRING DIAG:  Diagnosis  M25.521 (ICD-10-CM) - Right elbow pain  M25.511,G89.29 (ICD-10-CM) - Chronic right shoulder pain  M54.12 (ICD-10-CM) - Cervical radiculitis  M79.671,M79.672 (ICD-10-CM) - Bilateral foot pain    THERAPY DIAG:  Cervicalgia  Abnormal  posture  Pain in right arm  Muscle weakness (generalized)  Rationale for Evaluation and Treatment: Rehabilitation  ONSET DATE: acute on chronic- had similar issue in 2022, returned in May 2025  SUBJECTIVE:                                                                                                                                                                                      SUBJECTIVE STATEMENT: Dr Leonce Sept 16. Cancel inj for now. Working on posture. I think PT is helping.     PERTINENT HISTORY: See above   PAIN:  Are you having pain? Yes: NPRS  scale: 3/10 Pain location: top of head, neck, down into shoulder and elbow, numbness last 2 fingers and thumb but can be whole hand too  Pain description: heavy, sharp, dull, want to cut it off, numbness can be in whole hand sometimes kind of big and swollen  Aggravating factors: hard to say  Relieving factors: nothing   PRECAUTIONS: Other: hx hypermobility syndrome   RED FLAGS: None   WEIGHT BEARING RESTRICTIONS: No  FALLS:  Has patient fallen in last 6 months? 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  DIAGNOSTIC FINDINGS:   Uric acid level is normal.  Gout is very unlikely.     Right big toe x-ray shows no broken bones.  They did not see a lot of arthritis.     Left foot x-ray shows no broken bones.  Cervical spine x-ray shows some arthritis in the base of the cervical spine at C6-7  PATIENT SURVEYS:  PSFS: THE PATIENT SPECIFIC FUNCTIONAL SCALE  Place score of 0-10 (0 = unable to perform activity  and 10 = able to perform activity at the same level as before injury or problem)  Activity Date: 05/15/24    Typing   7    2. Sitting comfortably  7    3. Lifting anything  5    4.  Gripping  3    Total Score 5.5      Total Score = Sum of activity scores/number of activities  Minimally Detectable Change: 3 points (for single activity); 2 points (for average score)  [facility] (nd). The Patient Specific Functional Scale . Retrieved from [email or web address]   COGNITION: Overall cognitive status: Within functional limits for tasks assessed     SENSATION: Numbness last 2 fingers, sometimes thumb sometimes whole hand   POSTURE: Rounded shoulders, forward head   UPPER EXTREMITY ROM:   Active ROM Right eval Left eval  Shoulder flexion WNL  WNL   Shoulder extension    Shoulder abduction WNL  WNL   Shoulder adduction    Shoulder internal rotation WNL  WNL   Shoulder external rotation WNL  WNL   Elbow flexion WNL  WNL   Elbow extension WNL  WNL   Wrist flexion    Wrist extension    Wrist ulnar deviation    Wrist radial deviation    Wrist pronation    Wrist supination    (Blank rows = not tested)  Cervical AROM flexion and extension WNL, R lateral flexion 70% limited, L lateral flexion 50% limited, cervical rotation WNL B   Thoracic AROM: flexion and extension WNL, R lateral flexion 25% limited, L lateral flexion 75% limited, R rotation 25% limited, L 50% limited   UPPER EXTREMITY MMT:  MMT Right eval Left eval  Shoulder flexion 4+ 4+  Shoulder extension    Shoulder abduction 4+ 4+  Shoulder adduction    Shoulder internal rotation 4 4-  Shoulder external rotation 4 4-  Middle trapezius    Lower trapezius    Elbow flexion    Elbow extension    Wrist flexion    Wrist extension  Wrist ulnar deviation    Wrist radial deviation    Wrist pronation    Wrist supination    Grip strength (lbs)     (Blank rows = not tested)  SHOULDER SPECIAL TESTS:  Empty can test negative B, no pain reaching OH that might indicate rotator cuff involvement     PALPATION:   Very tight and tender in R upper traps and cervical paraspinals/suboccipitals, noted palpable scapular winging at rest                                                                                                                              TREATMENT DATE:   06/05/24  UBE L 2 3 min each way Seated rows 20# 2 sets 10 Lats 20# 2 sets 10 Red Tband 3 way stab on wall 10 x BIL Back to wall weighted ball OHP 2# wall angles 10 x STM to the right upper trap, neck and rhomboid, manual shoulder depression and  gentle cervical stretches Cervical traction 13#       05/29/24 UBE Level 3 x 5 minutes Seated rows 15# vc, tc's Lats 15# vc's and tc's Red tband horizontal abduction 5# shrugs with upper trap and levator stretches Back to wall weighted ball OHP STM to the right upper trap, neck and rhomboid Occipital release, manual shoulder depression, some gentle cervical stretches' Cervical traction 13#  05/24/24 Educated Pt on MRI diagnosis UBE L1.5 x 2 min each Rows & Ext red 2x10 Shoulder ER red 2x10 DSTW to RT cerv,trap and rhomboids- very tight Passive stretching cervical KT tape to RT cerv- relax trap and help with TP in trap and rhomboids  05/22/24 DSTW to RT cerv,trap and rhomboids- very tight Passive stretching cervical Mech traction 15 min KT tape to RT cerv- relax trap and help with TP in trap and rhomboids   05/15/24  Eval, POC, HEP practice and education as below   UBE L2 x3 min forward/3 min backward    PATIENT EDUCATION: Education details: exam findings, POC, HEP  Person educated: Patient Education method: Programmer, multimedia, Demonstration, and Handouts Education comprehension: verbalized understanding, returned demonstration, and needs further education  HOME EXERCISE PROGRAM:  Access Code:  [email or web address] URL: [email or web address] Date: 05/15/2024 Prepared by: Josette Rough  Exercises - Scapular Retraction with Resistance  - 1 x daily - 7 x weekly - 2 sets - 10 reps - 2 seconds  hold - Shoulder extension with resistance - Neutral  - 1 x daily - 7 x weekly - 2 sets - 10 reps - 2 seconds  hold - Seated Backward Shoulder Rolls  - 2-3 x daily - 7 x weekly - 1 sets - 20 reps - Seated Cervical Retraction  - 2-3 x daily - 7 x weekly - 1 sets - 10 reps - 3 seconds  hold  ASSESSMENT:  CLINICAL IMPRESSION:  Goals assessed and documented. Pt feels PT is helping but so did prednisone  and the fact that  she has not done anything to aggravate it. Overall states about 50% better since start of PT. Pt much more aware of posture throughout the session and states trying to be more aware with day to day actvities. Started educ on desk set up and ergonomics. Answers questions to the best of my ability re: epidural inj     Patient with the results of MRI showing,  IMPRESSION: 1. 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 2. Moderate right foraminal stenosis at C5-C6 secondary to uncovertebral joint disease. 3. No signal abnormality of the cord. We discussed the results and talked about the plan of Dr. Leonce to do the steroid injection.  We talked about the need for posture and mm relaxation as well.  We did STM and some gentle occipital release with some passive stretches.  I did continue with the traction.    Patient is a 58 y.o. F who was seen today for physical therapy evaluation and treatment for  Diagnosis  M25.521 (ICD-10-CM) - Right elbow pain  M25.511,G89.29 (ICD-10-CM) - Chronic right shoulder pain  M54.12 (ICD-10-CM) - Cervical radiculitis  M79.671,M79.672 (ICD-10-CM) - Bilateral foot pain  .  Sounds like foot pain is in big toe and may be able to be addressed with some custom inserts from [employer], gave her referral and  location materials. Cervical/UE pain and impairments do seem directly related to likely radiculopathy- PT was very successful in handling this for her in the past, anticipate she will respond well. Will need a lot of focus on strengthening and NMR due to hx of hypermobility syndrome possibly influencing return of pain.   OBJECTIVE IMPAIRMENTS: decreased mobility, decreased ROM, hypomobility, increased fascial restrictions, increased muscle spasms, impaired UE functional use, improper body mechanics, postural dysfunction, and pain.   ACTIVITY LIMITATIONS: carrying, lifting, sleeping, bathing, toileting, dressing, reach over head, hygiene/grooming, and caring for others  PARTICIPATION LIMITATIONS: meal prep, cleaning, laundry, driving, shopping, community activity, occupation, and yard work  PERSONAL FACTORS: Age, Behavior pattern, Education, Fitness, Past/current experiences, Profession, Sex, Social background, and Time since onset of injury/illness/exacerbation are also affecting patient's functional outcome.   REHAB POTENTIAL: Good  CLINICAL DECISION MAKING: Stable/uncomplicated  EVALUATION COMPLEXITY: Low   GOALS: Goals reviewed with patient? No  SHORT TERM GOALS: Target date: 06/12/2024    Will be compliant with appropriate progressive HEP  Baseline: Goal status: Met 05/24/24  2.  Cervical and thoracic AROM to have normalized and will be pain free  Baseline:  Goal status: progressing 05/29/24  MET 06/05/24  3.  Will demonstrate improved awareness of posture with all functional tasks  Baseline:  Goal status: met 05/29/24  4.  Noted mm spasms in neck and upper quarter to have resolved  Baseline:  Goal status: INITIAL  progressing 06/05/24    LONG TERM GOALS: Target date: 07/10/2024    MMT to be 5/5 all tested groups  Baseline:  Goal status: INITIAL  2.  Radicular sx to have resolved by at least 75% Baseline:  Goal status: INITIAL  3.  Pain in head/neck/shoulder/elbow to  have improved by at least 75% Baseline:  Goal status: INITIAL  4.  Will express no difficulties with gripping/writing/typing  Baseline:  Goal status: INITIAL  5.  Will be able to exercise as desired without increase in pain  Baseline:  Goal status: INITIAL  6.  PSFS to have improved by at least 2 points  Baseline:  Goal status: INITIAL  PLAN:  PT FREQUENCY: 2x/week  PT DURATION: 8 weeks  PLANNED INTERVENTIONS: 97750- Physical Performance Testing, 97110-Therapeutic exercises, 97530- Therapeutic activity, [id number]- Neuromuscular re-education, 97535- Self Care, [id number]- Manual therapy, [id number]- Electrical stimulation (manual), [id number]- Ultrasound, [id number]- Traction (mechanical), and 20560 (1-2 muscles), 20561 (3+ muscles)- Dry Needling  PLAN FOR NEXT SESSION: We will continue plan- strengthening, posture, traction and DN ? at least until she sees Dr. Leonce Slough Meklit Cotta, PTA 06/05/24 8:19 AM

## 2024-06-08 ENCOUNTER — Other Ambulatory Visit

## 2024-06-12 ENCOUNTER — Encounter: Payer: Self-pay | Admitting: Physical Therapy

## 2024-06-12 ENCOUNTER — Ambulatory Visit: Admitting: Physical Therapy

## 2024-06-12 DIAGNOSIS — R293 Abnormal posture: Secondary | ICD-10-CM

## 2024-06-12 DIAGNOSIS — M79601 Pain in right arm: Secondary | ICD-10-CM

## 2024-06-12 DIAGNOSIS — M542 Cervicalgia: Secondary | ICD-10-CM

## 2024-06-12 DIAGNOSIS — M6281 Muscle weakness (generalized): Secondary | ICD-10-CM

## 2024-06-12 DIAGNOSIS — R29898 Other symptoms and signs involving the musculoskeletal system: Secondary | ICD-10-CM

## 2024-06-12 NOTE — Therapy (Signed)
 OUTPATIENT PHYSICAL THERAPY SHOULDER TREATMENT   Patient Name: Jillian Mcknight MRN: [id number] DOB:[birth year], 58 y.o., female Today's Date: 06/12/2024  END OF SESSION:  PT End of Session - 06/12/24 0754     Visit Number 6    Date for PT Re-Evaluation 07/10/24    Authorization Type cigna    PT Start Time 0754    PT Stop Time 0851    PT Time Calculation (min) 57 min    Activity Tolerance Patient tolerated treatment well    Behavior During Therapy [facility] for tasks assessed/performed          Past Medical History:  Diagnosis Date   Allergy    Arthritis    knees   Carpal tunnel syndrome of right wrist    Depression    Headache, frequent episodic tension-type    Past Surgical History:  Procedure Laterality Date   BREAST BIOPSY Left 03/2021   CARPAL TUNNEL RELEASE Right 08/10/2016   Procedure: RIGHT CARPAL TUNNEL RELEASE;  Surgeon: Arley Curia, MD;  Location: [facility];  Service: Orthopedics;  Laterality: Right;  FAB   ELBOW FRACTURE SURGERY Left 11/01/2018   WISDOM TOOTH EXTRACTION  1982   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   IBS (irritable bowel syndrome) 04/06/2013   Snoring 04/06/2013    PCP: Katheen Roselie Rockford NP   REFERRING PROVIDER: Joane Artist RAMAN, MD  REFERRING DIAG:  Diagnosis  M25.521 (ICD-10-CM) - Right elbow pain  M25.511,G89.29 (ICD-10-CM) - Chronic right shoulder pain  M54.12 (ICD-10-CM) - Cervical radiculitis  M79.671,M79.672 (ICD-10-CM) - Bilateral  foot pain    THERAPY DIAG:  Cervicalgia  Abnormal posture  Pain in right arm  Muscle weakness (generalized)  Other symptoms and signs involving the musculoskeletal system  Rationale for Evaluation and Treatment: Rehabilitation  ONSET DATE: acute on chronic- had similar issue in 2022, returned in May 2025  SUBJECTIVE:                                                                                                                                                                                      SUBJECTIVE STATEMENT: Doing better, not pain all the way down, just in spots down the arm.  I am trying to do some hand weights.    PERTINENT HISTORY: See above   PAIN:  Are you having pain? Yes: NPRS scale: 3/10 Pain location: top of head, neck, down into shoulder and elbow, numbness last 2 fingers and thumb but can be whole hand too  Pain description: heavy, sharp, dull, want to cut it off, numbness can be in whole hand sometimes kind of big and swollen  Aggravating factors: hard to say  Relieving factors: nothing   PRECAUTIONS: Other: hx hypermobility syndrome   RED FLAGS: None   WEIGHT BEARING RESTRICTIONS: No  FALLS:  Has patient fallen in last 6 months? 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  DIAGNOSTIC FINDINGS:   Uric acid level is normal.  Gout is very unlikely.     Right big toe x-ray shows no broken bones.  They did not see a lot of arthritis.     Left foot x-ray shows no broken bones.  Cervical spine x-ray shows some arthritis in the base of the  cervical spine at C6-7  PATIENT SURVEYS:  PSFS: THE PATIENT SPECIFIC FUNCTIONAL SCALE  Place score of 0-10 (0 = unable to perform activity and 10 = able to perform activity at the same level as before injury or problem)  Activity Date: 05/15/24    Typing   7    2. Sitting comfortably  7    3. Lifting anything  5    4.  Gripping  3    Total Score 5.5      Total Score = Sum of activity scores/number of activities  Minimally Detectable Change: 3 points (for single activity); 2 points (for average score)  [facility] (nd). The Patient Specific Functional Scale . Retrieved from [email or web address]   COGNITION: Overall cognitive status: Within functional limits for tasks assessed     SENSATION: Numbness last 2 fingers, sometimes thumb sometimes whole hand   POSTURE: Rounded shoulders, forward head   UPPER EXTREMITY ROM:   Active ROM Right eval Left eval  Shoulder flexion WNL  WNL   Shoulder extension    Shoulder abduction WNL  WNL   Shoulder adduction    Shoulder internal rotation WNL  WNL   Shoulder external rotation WNL  WNL   Elbow flexion WNL  WNL   Elbow extension WNL  WNL   Wrist flexion    Wrist extension    Wrist ulnar deviation    Wrist radial deviation    Wrist pronation    Wrist supination    (Blank rows = not tested)  Cervical AROM flexion and extension WNL, R lateral flexion 70% limited, L lateral flexion 50% limited, cervical rotation WNL B   Thoracic AROM: flexion and extension WNL, R lateral flexion 25% limited, L lateral flexion 75% limited, R rotation 25% limited, L 50% limited   UPPER EXTREMITY MMT:  MMT Right eval Left eval  Shoulder flexion 4+ 4+  Shoulder extension    Shoulder abduction 4+ 4+  Shoulder adduction    Shoulder internal rotation 4 4-  Shoulder external rotation 4 4-  Middle trapezius  Lower trapezius    Elbow flexion    Elbow extension    Wrist flexion     Wrist extension    Wrist ulnar deviation    Wrist radial deviation    Wrist pronation    Wrist supination    Grip strength (lbs)    (Blank rows = not tested)  SHOULDER SPECIAL TESTS:  Empty can test negative B, no pain reaching OH that might indicate rotator cuff involvement     PALPATION:   Very tight and tender in R upper traps and cervical paraspinals/suboccipitals, noted palpable scapular winging at rest                                                                                                                              TREATMENT DATE:  06/12/24 UBE level 3.5 x 5 minutes 3# hand weights having her demo her program, PT giving advice on form and what to avoid Seated row 20# Lats 20# Red tband ER Red tband horizontal abduction 10# straight arm pulls Gait outside brisk walking Feet on ball K2C, rotation, bridge, isometric abs, verbal and tactile cues for core Green tband clamshells Ball b/n knees squeeze STM to the right upper trap and neck Cervical traction 13#   06/05/24  UBE L 2 3 min each way Seated rows 20# 2 sets 10 Lats 20# 2 sets 10 Red Tband 3 way stab on wall 10 x BIL Back to wall weighted ball OHP 2# wall angles 10 x STM to the right upper trap, neck and rhomboid, manual shoulder depression and  gentle cervical stretches Cervical traction 13#  05/29/24 UBE Level 3 x 5 minutes Seated rows 15# vc, tc's Lats 15# vc's and tc's Red tband horizontal abduction 5# shrugs with upper trap and levator stretches Back to wall weighted ball OHP STM to the right upper trap, neck and rhomboid Occipital release, manual shoulder depression, some gentle cervical stretches' Cervical traction 13#  05/24/24 Educated Pt on MRI diagnosis UBE L1.5 x 2 min each Rows & Ext red 2x10 Shoulder ER red 2x10 DSTW to RT cerv,trap and rhomboids- very tight Passive stretching cervical KT tape to RT cerv- relax trap and help with TP in trap and rhomboids  05/22/24 DSTW to  RT cerv,trap and rhomboids- very tight Passive stretching cervical Mech traction 15 min KT tape to RT cerv- relax trap and help with TP in trap and rhomboids   05/15/24  Eval, POC, HEP practice and education as below   UBE L2 x3 min forward/3 min backward    PATIENT EDUCATION: Education details: exam findings, POC, HEP  Person educated: Patient Education method: Programmer, multimedia, Demonstration, and Handouts Education comprehension: verbalized understanding, returned demonstration, and needs further education  HOME EXERCISE PROGRAM:  Access Code: [email or web address] URL: [email or web address] Date: 05/15/2024 Prepared by: Josette Rough  Exercises - Scapular Retraction with Resistance  - 1 x daily - 7 x weekly - 2 sets - 10 reps - 2  seconds  hold - Shoulder extension with resistance - Neutral  - 1 x daily - 7 x weekly - 2 sets - 10 reps - 2 seconds  hold - Seated Backward Shoulder Rolls  - 2-3 x daily - 7 x weekly - 1 sets - 20 reps - Seated Cervical Retraction  - 2-3 x daily - 7 x weekly - 1 sets - 10 reps - 3 seconds  hold  Access Code: [email or web address] URL: [email or web address] Date: 06/12/2024 Prepared by: Ozell Mainland  Exercises - Shoulder External Rotation and Scapular Retraction with Resistance  - 1 x daily - 7 x weekly - 3 sets - 10 reps - 30 hold - Standing Shoulder Row with Anchored Resistance  - 1 x daily - 7 x weekly - 3 sets - 10 reps - 30 hold - Shoulder extension with resistance - Neutral  - 1 x daily - 7 x weekly - 3 sets - 10 reps - 30 hold - Standing Shoulder Horizontal Abduction with Resistance  - 1 x daily - 7 x weekly - 3 sets - 10 reps - 30 hold - Bridge with Arms at [employer] and Feet on [employer]  - 1 x daily - 7 x weekly - 3 sets - 10 reps - 30 hold  ASSESSMENT:  CLINICAL IMPRESSION:  Overall states about 50% better since start of PT. Patient has wants to get back to activities, we started some of this today trying to work on total body and  assuring posture and safety.  Added some legs and core, reviewed her current things that she is doing at home on her own, gave her some advice for overhead activities and added some to her HEP  Patient with the results of MRI showing,  IMPRESSION: 1. 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 2. Moderate right foraminal stenosis at C5-C6 secondary to uncovertebral joint disease. 3. No signal abnormality of the cord. We discussed the results and talked about the plan of Dr. Leonce to do the steroid injection.  We talked about the need for posture and mm relaxation as well.  We did STM and some gentle occipital release with some passive stretches.  I did continue with the traction.    Patient is a 58 y.o. F who was seen today for physical therapy evaluation and treatment for  Diagnosis  M25.521 (ICD-10-CM) - Right elbow pain  M25.511,G89.29 (ICD-10-CM) - Chronic right shoulder pain  M54.12 (ICD-10-CM) - Cervical radiculitis  M79.671,M79.672 (ICD-10-CM) - Bilateral foot pain  .  Sounds like foot pain is in big toe and may be able to be addressed with some custom inserts from [employer], gave her referral and location materials. Cervical/UE pain and impairments do seem directly related to likely radiculopathy- PT was very successful in handling this for her in the past, anticipate she will respond well. Will need a lot of focus on strengthening and NMR due to hx of hypermobility syndrome possibly influencing return of pain.   OBJECTIVE IMPAIRMENTS: decreased mobility, decreased ROM, hypomobility, increased fascial restrictions, increased muscle spasms, impaired UE functional use, improper body mechanics, postural dysfunction, and pain.   ACTIVITY LIMITATIONS: carrying, lifting, sleeping, bathing, toileting, dressing, reach over head, hygiene/grooming, and caring for others  PARTICIPATION LIMITATIONS: meal prep, cleaning, laundry, driving, shopping,  community activity, occupation, and yard work  PERSONAL FACTORS: Age, Behavior pattern, Education, Fitness, Past/current experiences, Profession, Sex, Social background, and Time since onset of injury/illness/exacerbation are also affecting patient's functional outcome.  REHAB POTENTIAL: Good  CLINICAL DECISION MAKING: Stable/uncomplicated  EVALUATION COMPLEXITY: Low   GOALS: Goals reviewed with patient? No  SHORT TERM GOALS: Target date: 06/12/2024    Will be compliant with appropriate progressive HEP  Baseline: Goal status: Met 05/24/24  2.  Cervical and thoracic AROM to have normalized and will be pain free  Baseline:  Goal status: progressing 05/29/24  MET 06/05/24  3.  Will demonstrate improved awareness of posture with all functional tasks  Baseline:  Goal status: met 05/29/24  4.  Noted mm spasms in neck and upper quarter to have resolved  Baseline:  Goal status: INITIAL  progressing 06/12/24    LONG TERM GOALS: Target date: 07/10/2024    MMT to be 5/5 all tested groups  Baseline:  Goal status: INITIAL  2.  Radicular sx to have resolved by at least 75% Baseline:  Goal status: progressing 06/12/24  3.  Pain in head/neck/shoulder/elbow to have improved by at least 75% Baseline:  Goal status: INITIAL  4.  Will express no difficulties with gripping/writing/typing  Baseline:  Goal status: INITIAL  5.  Will be able to exercise as desired without increase in pain  Baseline:  Goal status:  progressing 06/12/24  6.  PSFS to have improved by at least 2 points  Baseline:  Goal status: INITIAL  PLAN:  PT FREQUENCY: 2x/week  PT DURATION: 8 weeks  PLANNED INTERVENTIONS: 97750- Physical Performance Testing, 97110-Therapeutic exercises, 97530- Therapeutic activity, [id number]- Neuromuscular re-education, 97535- Self Care, [id number]- Manual therapy, [id number]- Electrical stimulation (manual), [id number]- Ultrasound, 02987- Traction (mechanical), and 20560 (1-2 muscles), 20561 (3+  muscles)- Dry Needling  PLAN FOR NEXT SESSION: We will continue plan- strengthening, posture, traction and DN ? at least until she sees Dr. Leonce Ozell Mainland, PT 06/12/24 7:55 AM

## 2024-06-19 ENCOUNTER — Ambulatory Visit: Attending: Family Medicine | Admitting: Physical Therapy

## 2024-06-19 DIAGNOSIS — R293 Abnormal posture: Secondary | ICD-10-CM | POA: Diagnosis present

## 2024-06-19 DIAGNOSIS — M6281 Muscle weakness (generalized): Secondary | ICD-10-CM | POA: Diagnosis present

## 2024-06-19 DIAGNOSIS — M79601 Pain in right arm: Secondary | ICD-10-CM | POA: Insufficient documentation

## 2024-06-19 DIAGNOSIS — M542 Cervicalgia: Secondary | ICD-10-CM | POA: Diagnosis present

## 2024-06-19 NOTE — Therapy (Signed)
 OUTPATIENT PHYSICAL THERAPY SHOULDER TREATMENT   Patient Name: Jillian Mcknight MRN: [id number] DOB:[birth year], 58 y.o., female Today's Date: 06/19/2024  END OF SESSION:  PT End of Session - 06/19/24 0758     Visit Number 7    Date for PT Re-Evaluation 07/10/24    Authorization Type cigna    Authorization Time Period 05/15/24 to 07/10/24    PT Start Time 0758    PT Stop Time 0850    PT Time Calculation (min) 52 min          Past Medical History:  Diagnosis Date   Allergy    Arthritis    knees   Carpal tunnel syndrome of right wrist    Depression    Headache, frequent episodic tension-type    Past Surgical History:  Procedure Laterality Date   BREAST BIOPSY Left 03/2021   CARPAL TUNNEL RELEASE Right 08/10/2016   Procedure: RIGHT CARPAL TUNNEL RELEASE;  Surgeon: Arley Curia, MD;  Location: [facility];  Service: Orthopedics;  Laterality: Right;  FAB   ELBOW FRACTURE SURGERY Left 11/01/2018   WISDOM TOOTH EXTRACTION  1982   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   IBS (irritable bowel syndrome) 04/06/2013   Snoring 04/06/2013    PCP: Katheen Roselie Rockford NP   REFERRING PROVIDER: Joane Artist RAMAN, MD  REFERRING DIAG:  Diagnosis  M25.521 (ICD-10-CM) - Right elbow pain  M25.511,G89.29 (ICD-10-CM) - Chronic right shoulder pain  M54.12 (ICD-10-CM) - Cervical radiculitis  M79.671,M79.672 (ICD-10-CM) - Bilateral foot pain    THERAPY DIAG:  Cervicalgia  Abnormal  posture  Pain in right arm  Muscle weakness (generalized)  Rationale for Evaluation and Treatment: Rehabilitation  ONSET DATE: acute on chronic- had similar issue in 2022, returned in May 2025  SUBJECTIVE:                                                                                                                                                                                      SUBJECTIVE STATEMENT: doing good. Worked in yard and did pretty good, some LBP with lifting.walked dog and did fine but after pain with bending it   PERTINENT HISTORY: See above   PAIN:  Are  you having pain? Yes: NPRS scale: 3/10 Pain location: top of head, neck, down into shoulder and elbow, numbness last 2 fingers and thumb but can be whole hand too  Pain description: heavy, sharp, dull, want to cut it off, numbness can be in whole hand sometimes kind of big and swollen  Aggravating factors: hard to say  Relieving factors: nothing   PRECAUTIONS: Other: hx hypermobility syndrome   RED FLAGS: None   WEIGHT BEARING RESTRICTIONS: No  FALLS:  Has patient fallen in last 6 months? 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  DIAGNOSTIC FINDINGS:   Uric acid level is normal.  Gout is very unlikely.     Right big toe x-ray shows no broken bones.  They did not see a lot of arthritis.     Left foot x-ray shows no broken bones.  Cervical spine x-ray shows some arthritis in the base of the cervical spine at C6-7  PATIENT SURVEYS:  PSFS: THE PATIENT SPECIFIC FUNCTIONAL SCALE  Place score  of 0-10 (0 = unable to perform activity and 10 = able to perform activity at the same level as before injury or problem)  Activity Date: 05/15/24    Typing   7    2. Sitting comfortably  7    3. Lifting anything  5    4.  Gripping  3    Total Score 5.5      Total Score = Sum of activity scores/number of activities  Minimally Detectable Change: 3 points (for single activity); 2 points (for average score)  [facility] (nd). The Patient Specific Functional Scale . Retrieved from [email or web address]   COGNITION: Overall cognitive status: Within functional limits for tasks assessed     SENSATION: Numbness last 2 fingers, sometimes thumb sometimes whole hand   POSTURE: Rounded shoulders, forward head   UPPER EXTREMITY ROM:   Active ROM Right eval Left eval  Shoulder flexion WNL  WNL   Shoulder extension    Shoulder abduction WNL  WNL   Shoulder adduction    Shoulder internal rotation WNL  WNL   Shoulder external rotation WNL  WNL   Elbow flexion WNL  WNL   Elbow extension WNL  WNL   Wrist flexion    Wrist extension    Wrist ulnar deviation    Wrist radial deviation    Wrist pronation    Wrist supination    (Blank rows = not tested)  Cervical AROM flexion and extension WNL, R lateral flexion 70% limited, L lateral flexion 50% limited, cervical rotation WNL B   Thoracic AROM: flexion and extension WNL, R lateral flexion 25% limited, L lateral flexion 75% limited, R rotation 25% limited, L 50% limited   UPPER EXTREMITY MMT:  MMT Right eval Left eval RT/Left 06/19/24  Shoulder flexion 4+ 4+ 4+/4+  Shoulder extension     Shoulder abduction 4+ 4+ 4+/4+  Shoulder adduction     Shoulder internal rotation 4 4- 4/4  Shoulder external rotation 4 4- 4/4  Middle trapezius     Lower trapezius     Elbow flexion  Elbow extension     Wrist flexion     Wrist extension     Wrist ulnar deviation     Wrist  radial deviation     Wrist pronation     Wrist supination     Grip strength (lbs)     (Blank rows = not tested)  SHOULDER SPECIAL TESTS:  Empty can test negative B, no pain reaching OH that might indicate rotator cuff involvement     PALPATION:   Very tight and tender in R upper traps and cervical paraspinals/suboccipitals, noted palpable scapular winging at rest                                                                                                                              TREATMENT DATE:   06/19/24 Nustep L 5 Looked at and answered questions re: turf toe inserts STS with wt ball chest press 10 x the OH 10 x Cable pulley squat row 15# 2 sets 10 Black tband trunk flex and ext 20 x 20# lat pull 20# 2 sets 12 Leg press 30# 3 sets 10 Calf raises 30# 2 sets 15 Red tband 3 ways stab 10 x  STM to the right upper trap and neck Cervical traction 13#       06/12/24 UBE level 3.5 x 5 minutes 3# hand weights having her demo her program, PT giving advice on form and what to avoid Seated row 20# Lats 20# Red tband ER Red tband horizontal abduction 10# straight arm pulls Gait outside brisk walking Feet on ball K2C, rotation, bridge, isometric abs, verbal and tactile cues for core Green tband clamshells Ball b/n knees squeeze STM to the right upper trap and neck Cervical traction 13#   06/05/24  UBE L 2 3 min each way Seated rows 20# 2 sets 10 Lats 20# 2 sets 10 Red Tband 3 way stab on wall 10 x BIL Back to wall weighted ball OHP 2# wall angles 10 x STM to the right upper trap, neck and rhomboid, manual shoulder depression and  gentle cervical stretches Cervical traction 13#  05/29/24 UBE Level 3 x 5 minutes Seated rows 15# vc, tc's Lats 15# vc's and tc's Red tband horizontal abduction 5# shrugs with upper trap and levator stretches Back to wall weighted ball OHP STM to the right upper trap, neck and rhomboid Occipital release, manual shoulder  depression, some gentle cervical stretches' Cervical traction 13#  05/24/24 Educated Pt on MRI diagnosis UBE L1.5 x 2 min each Rows & Ext red 2x10 Shoulder ER red 2x10 DSTW to RT cerv,trap and rhomboids- very tight Passive stretching cervical KT tape to RT cerv- relax trap and help with TP in trap and rhomboids  05/22/24 DSTW to RT cerv,trap and rhomboids- very tight Passive stretching cervical Mech traction 15 min KT tape to RT cerv- relax trap and help with TP in trap and rhomboids   05/15/24  Eval, POC, HEP  practice and education as below   UBE L2 x3 min forward/3 min backward    PATIENT EDUCATION: Education details: exam findings, POC, HEP  Person educated: Patient Education method: Explanation, Demonstration, and Handouts Education comprehension: verbalized understanding, returned demonstration, and needs further education  HOME EXERCISE PROGRAM:  Access Code: [email or web address] URL: [email or web address] Date: 05/15/2024 Prepared by: Josette Rough  Exercises - Scapular Retraction with Resistance  - 1 x daily - 7 x weekly - 2 sets - 10 reps - 2 seconds  hold - Shoulder extension with resistance - Neutral  - 1 x daily - 7 x weekly - 2 sets - 10 reps - 2 seconds  hold - Seated Backward Shoulder Rolls  - 2-3 x daily - 7 x weekly - 1 sets - 20 reps - Seated Cervical Retraction  - 2-3 x daily - 7 x weekly - 1 sets - 10 reps - 3 seconds  hold  Access Code: [email or web address] URL: [email or web address] Date: 06/12/2024 Prepared by: Ozell Mainland  Exercises - Shoulder External Rotation and Scapular Retraction with Resistance  - 1 x daily - 7 x weekly - 3 sets - 10 reps - 30 hold - Standing Shoulder Row with Anchored Resistance  - 1 x daily - 7 x weekly - 3 sets - 10 reps - 30 hold - Shoulder extension with resistance - Neutral  - 1 x daily - 7 x weekly - 3 sets - 10 reps - 30 hold - Standing Shoulder Horizontal Abduction with Resistance  - 1 x daily - 7 x  weekly - 3 sets - 10 reps - 30 hold - Bridge with Arms at [employer] and Feet on [employer]  - 1 x daily - 7 x weekly - 3 sets - 10 reps - 30 hold  ASSESSMENT:  CLINICAL IMPRESSION:  pt arrived after weekend of working in yard and did fairly well, some LB discomfort. Continued to progress total body ex with cuing for posture and BM. Educ and answer questions re: inserts for turf toe. Patient with the results of MRI showing,  IMPRESSION: 1. 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 2. Moderate right foraminal stenosis at C5-C6 secondary to uncovertebral joint disease. 3. No signal abnormality of the cord. We discussed the results and talked about the plan of Dr. Leonce to do the steroid injection.  We talked about the need for posture and mm relaxation as well.  We did STM and some gentle occipital release with some passive stretches.  I did continue with the traction.    Patient is a 58 y.o. F who was seen today for physical therapy evaluation and treatment for  Diagnosis  M25.521 (ICD-10-CM) - Right elbow pain  M25.511,G89.29 (ICD-10-CM) - Chronic right shoulder pain  M54.12 (ICD-10-CM) - Cervical radiculitis  M79.671,M79.672 (ICD-10-CM) - Bilateral foot pain  .  Sounds like foot pain is in big toe and may be able to be addressed with some custom inserts from [employer], gave her referral and location materials. Cervical/UE pain and impairments do seem directly related to likely radiculopathy- PT was very successful in handling this for her in the past, anticipate she will respond well. Will need a lot of focus on strengthening and NMR due to hx of hypermobility syndrome possibly influencing return of pain.   OBJECTIVE IMPAIRMENTS: decreased mobility, decreased ROM, hypomobility, increased fascial restrictions, increased muscle spasms, impaired UE functional use, improper body mechanics, postural dysfunction, and pain.   ACTIVITY LIMITATIONS: carrying,  lifting,  sleeping, bathing, toileting, dressing, reach over head, hygiene/grooming, and caring for others  PARTICIPATION LIMITATIONS: meal prep, cleaning, laundry, driving, shopping, community activity, occupation, and yard work  PERSONAL FACTORS: Age, Behavior pattern, Education, Fitness, Past/current experiences, Profession, Sex, Social background, and Time since onset of injury/illness/exacerbation are also affecting patient's functional outcome.   REHAB POTENTIAL: Good  CLINICAL DECISION MAKING: Stable/uncomplicated  EVALUATION COMPLEXITY: Low   GOALS: Goals reviewed with patient? No  SHORT TERM GOALS: Target date: 06/12/2024    Will be compliant with appropriate progressive HEP  Baseline: Goal status: Met 05/24/24  2.  Cervical and thoracic AROM to have normalized and will be pain free  Baseline:  Goal status: progressing 05/29/24  MET 06/05/24  3.  Will demonstrate improved awareness of posture with all functional tasks  Baseline:  Goal status: met 05/29/24  4.  Noted mm spasms in neck and upper quarter to have resolved  Baseline:  Goal status: INITIAL  progressing 06/12/24  and 06/19/24    LONG TERM GOALS: Target date: 07/10/2024    MMT to be 5/5 all tested groups  Baseline:  Goal status: progressing 06/19/24  2.  Radicular sx to have resolved by at least 75% Baseline:  Goal status: progressing 06/12/24  3.  Pain in head/neck/shoulder/elbow to have improved by at least 75% Baseline:  Goal status: 06/19/24 50% progresisng  4.  Will express no difficulties with gripping/writing/typing  Baseline:  Goal status: INITIAL  5.  Will be able to exercise as desired without increase in pain  Baseline:  Goal status:  progressing 06/12/24  6.  PSFS to have improved by at least 2 points  Baseline:  Goal status: INITIAL  PLAN:  PT FREQUENCY: 2x/week  PT DURATION: 8 weeks  PLANNED INTERVENTIONS: 97750- Physical Performance Testing, 97110-Therapeutic exercises, 97530-  Therapeutic activity, [id number]- Neuromuscular re-education, 97535- Self Care, [id number]- Manual therapy, [id number]- Electrical stimulation (manual), [id number]- Ultrasound, [id number]- Traction (mechanical), and 20560 (1-2 muscles), 20561 (3+ muscles)- Dry Needling  PLAN FOR NEXT SESSION: progress strengthening Jon Or, PTA 06/19/24 8:44 AM

## 2024-06-26 ENCOUNTER — Ambulatory Visit: Admitting: Physical Therapy

## 2024-06-26 ENCOUNTER — Encounter: Payer: Self-pay | Admitting: Physical Therapy

## 2024-06-26 DIAGNOSIS — R293 Abnormal posture: Secondary | ICD-10-CM

## 2024-06-26 DIAGNOSIS — M6281 Muscle weakness (generalized): Secondary | ICD-10-CM

## 2024-06-26 DIAGNOSIS — M542 Cervicalgia: Secondary | ICD-10-CM

## 2024-06-26 DIAGNOSIS — M79601 Pain in right arm: Secondary | ICD-10-CM

## 2024-06-26 NOTE — Therapy (Signed)
 OUTPATIENT PHYSICAL THERAPY SHOULDER TREATMENT   Patient Name: Jillian Mcknight MRN: [id number] DOB:December 10, 1965, 58 y.o., female Today's Date: 06/26/2024  END OF SESSION:  PT End of Session - 06/26/24 0759     Visit Number 8    Date for PT Re-Evaluation 07/10/24    PT Start Time 0759    PT Stop Time 0844    PT Time Calculation (min) 45 min    Activity Tolerance Patient tolerated treatment well    Behavior During Therapy [facility] for tasks assessed/performed          Past Medical History:  Diagnosis Date   Allergy    Arthritis    knees   Carpal tunnel syndrome of right wrist    Depression    Headache, frequent episodic tension-type    Past Surgical History:  Procedure Laterality Date   BREAST BIOPSY Left 03/2021   CARPAL TUNNEL RELEASE Right 08/10/2016   Procedure: RIGHT CARPAL TUNNEL RELEASE;  Surgeon: Arley Curia, MD;  Location: [facility];  Service: Orthopedics;  Laterality: Right;  FAB   ELBOW FRACTURE SURGERY Left 11/01/2018   WISDOM TOOTH EXTRACTION  1982   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   IBS (irritable bowel syndrome) 04/06/2013   Snoring 04/06/2013    PCP: Katheen Roselie Rockford NP   REFERRING PROVIDER: Joane Artist RAMAN, MD  REFERRING DIAG:  Diagnosis  M25.521 (ICD-10-CM) - Right elbow pain  M25.511,G89.29 (ICD-10-CM) - Chronic right shoulder pain  M54.12 (ICD-10-CM) - Cervical radiculitis  M79.671,M79.672 (ICD-10-CM) - Bilateral foot pain    THERAPY DIAG:   Cervicalgia  Abnormal posture  Pain in right arm  Muscle weakness (generalized)  Rationale for Evaluation and Treatment: Rehabilitation  ONSET DATE: acute on chronic- had similar issue in 2022, returned in May 2025  SUBJECTIVE:                                                                                                                                                                                      SUBJECTIVE STATEMENT: Pretty good, Got a good workout last time, normal soreness, some shoulder pain    PERTINENT HISTORY: See above   PAIN:  Are you having pain? Yes: NPRS  scale: 2/10 Pain location: top of head, neck, down into shoulder and elbow, numbness last 2 fingers and thumb but can be whole hand too  Pain description: heavy, sharp, dull, want to cut it off, numbness can be in whole hand sometimes kind of big and swollen  Aggravating factors: hard to say  Relieving factors: nothing   PRECAUTIONS: Other: hx hypermobility syndrome   RED FLAGS: None   WEIGHT BEARING RESTRICTIONS: No  FALLS:  Has patient fallen in last 6 months? 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  DIAGNOSTIC FINDINGS:   Uric acid level is normal.  Gout is very unlikely.     Right big toe x-ray shows no broken bones.  They did not see a lot of arthritis.     Left foot x-ray shows no broken bones.  Cervical spine x-ray shows some arthritis in the base of the cervical spine at C6-7  PATIENT SURVEYS:  PSFS: THE PATIENT SPECIFIC FUNCTIONAL SCALE  Place score of 0-10 (0 = unable  to perform activity and 10 = able to perform activity at the same level as before injury or problem)  Activity Date: 05/15/24 Date 06/26/24   Typing   7 9   2. Sitting comfortably  7 9   3. Lifting anything  5 7   4.  Gripping  3 5   Total Score 5.5 10     Total Score = Sum of activity scores/number of activities  Minimally Detectable Change: 3 points (for single activity); 2 points (for average score)  [facility] (nd). The Patient Specific Functional Scale . Retrieved from [email or web address]   COGNITION: Overall cognitive status: Within functional limits for tasks assessed     SENSATION: Numbness last 2 fingers, sometimes thumb sometimes whole hand   POSTURE: Rounded shoulders, forward head   UPPER EXTREMITY ROM:   Active ROM Right eval Left eval  Shoulder flexion WNL  WNL   Shoulder extension    Shoulder abduction WNL  WNL   Shoulder adduction    Shoulder internal rotation WNL  WNL   Shoulder external rotation WNL  WNL   Elbow flexion WNL  WNL   Elbow extension WNL  WNL   Wrist flexion    Wrist extension    Wrist ulnar deviation    Wrist radial deviation    Wrist pronation    Wrist supination    (Blank rows = not tested)  Cervical AROM flexion and extension WNL, R lateral flexion 70% limited, L lateral flexion 50% limited, cervical rotation WNL B   Thoracic AROM: flexion and extension WNL, R lateral flexion 25% limited, L lateral flexion 75% limited, R rotation 25% limited, L 50% limited   UPPER EXTREMITY MMT:  MMT Right eval Left eval RT/Left 06/19/24  Shoulder flexion 4+ 4+ 4+/4+  Shoulder extension     Shoulder abduction 4+ 4+ 4+/4+  Shoulder adduction     Shoulder internal rotation 4 4- 4/4  Shoulder external rotation 4 4- 4/4  Middle trapezius     Lower trapezius     Elbow flexion     Elbow extension  Wrist flexion     Wrist extension     Wrist ulnar deviation     Wrist radial  deviation     Wrist pronation     Wrist supination     Grip strength (lbs)     (Blank rows = not tested)  SHOULDER SPECIAL TESTS:  Empty can test negative B, no pain reaching OH that might indicate rotator cuff involvement     PALPATION:   Very tight and tender in R upper traps and cervical paraspinals/suboccipitals, noted palpable scapular winging at rest                                                                                                                              TREATMENT DATE:  06/26/24 NuStep L 5 x 7 min Goals PSFS 10 Sit to stand OHP yellow ball 2x12 Shoulder ER green 2x10 Leg press 40# 3 sets 10 Rows & Lats 20lb 2x10 STM to the right upper trap and neck Cervical traction 13#     06/19/24 Nustep L 5 Looked at and answered questions re: turf toe inserts STS with wt ball chest press 10 x the OH 10 x Cable pulley squat row 15# 2 sets 10 Black tband trunk flex and ext 20 x 20# lat pull 20# 2 sets 12 Leg press 30# 3 sets 10 Calf raises 30# 2 sets 15 Red tband 3 ways stab 10 x  STM to the right upper trap and neck Cervical traction 13#       06/12/24 UBE level 3.5 x 5 minutes 3# hand weights having her demo her program, PT giving advice on form and what to avoid Seated row 20# Lats 20# Red tband ER Red tband horizontal abduction 10# straight arm pulls Gait outside brisk walking Feet on ball K2C, rotation, bridge, isometric abs, verbal and tactile cues for core Green tband clamshells Ball b/n knees squeeze STM to the right upper trap and neck Cervical traction 13#   06/05/24  UBE L 2 3 min each way Seated rows 20# 2 sets 10 Lats 20# 2 sets 10 Red Tband 3 way stab on wall 10 x BIL Back to wall weighted ball OHP 2# wall angles 10 x STM to the right upper trap, neck and rhomboid, manual shoulder depression and  gentle cervical stretches Cervical traction 13#  05/29/24 UBE Level 3 x 5 minutes Seated rows 15# vc, tc's Lats 15# vc's and  tc's Red tband horizontal abduction 5# shrugs with upper trap and levator stretches Back to wall weighted ball OHP STM to the right upper trap, neck and rhomboid Occipital release, manual shoulder depression, some gentle cervical stretches' Cervical traction 13#  05/24/24 Educated Pt on MRI diagnosis UBE L1.5 x 2 min each Rows & Ext red 2x10 Shoulder ER red 2x10 DSTW to RT cerv,trap and rhomboids- very tight Passive stretching cervical KT tape to RT cerv- relax trap and help with TP in trap and  rhomboids  05/22/24 DSTW to RT cerv,trap and rhomboids- very tight Passive stretching cervical Mech traction 15 min KT tape to RT cerv- relax trap and help with TP in trap and rhomboids   05/15/24  Eval, POC, HEP practice and education as below   UBE L2 x3 min forward/3 min backward    PATIENT EDUCATION: Education details: exam findings, POC, HEP  Person educated: Patient Education method: Programmer, multimedia, Demonstration, and Handouts Education comprehension: verbalized understanding, returned demonstration, and needs further education  HOME EXERCISE PROGRAM:  Access Code: [email or web address] URL: [email or web address] Date: 05/15/2024 Prepared by: Josette Rough  Exercises - Scapular Retraction with Resistance  - 1 x daily - 7 x weekly - 2 sets - 10 reps - 2 seconds  hold - Shoulder extension with resistance - Neutral  - 1 x daily - 7 x weekly - 2 sets - 10 reps - 2 seconds  hold - Seated Backward Shoulder Rolls  - 2-3 x daily - 7 x weekly - 1 sets - 20 reps - Seated Cervical Retraction  - 2-3 x daily - 7 x weekly - 1 sets - 10 reps - 3 seconds  hold  Access Code: [email or web address] URL: [email or web address] Date: 06/12/2024 Prepared by: Ozell Mainland  Exercises - Shoulder External Rotation and Scapular Retraction with Resistance  - 1 x daily - 7 x weekly - 3 sets - 10 reps - 30 hold - Standing Shoulder Row with Anchored Resistance  - 1 x daily - 7 x weekly - 3 sets -  10 reps - 30 hold - Shoulder extension with resistance - Neutral  - 1 x daily - 7 x weekly - 3 sets - 10 reps - 30 hold - Standing Shoulder Horizontal Abduction with Resistance  - 1 x daily - 7 x weekly - 3 sets - 10 reps - 30 hold - Bridge with Arms at [employer] and Feet on [employer]  - 1 x daily - 7 x weekly - 3 sets - 10 reps - 30 hold  ASSESSMENT:  CLINICAL IMPRESSION:  pt arrived after weekend of working in yard and did fairly well, Objective measures taken and she has progressed towards goals. Postural cues needed with seated shoulder ER. Cues to prevent trunk sway with seated rows. No pain during session, tissue density noted win the UT.  Patient is a 58 y.o. F who was seen today for physical therapy evaluation and treatment for  Diagnosis  M25.521 (ICD-10-CM) - Right elbow pain  M25.511,G89.29 (ICD-10-CM) - Chronic right shoulder pain  M54.12 (ICD-10-CM) - Cervical radiculitis  M79.671,M79.672 (ICD-10-CM) - Bilateral foot pain  .  Sounds like foot pain is in big toe and may be able to be addressed with some custom inserts from [employer], gave her referral and location materials. Cervical/UE pain and impairments do seem directly related to likely radiculopathy- PT was very successful in handling this for her in the past, anticipate she will respond well. Will need a lot of focus on strengthening and NMR due to hx of hypermobility syndrome possibly influencing return of pain.   OBJECTIVE IMPAIRMENTS: decreased mobility, decreased ROM, hypomobility, increased fascial restrictions, increased muscle spasms, impaired UE functional use, improper body mechanics, postural dysfunction, and pain.   ACTIVITY LIMITATIONS: carrying, lifting, sleeping, bathing, toileting, dressing, reach over head, hygiene/grooming, and caring for others  PARTICIPATION LIMITATIONS: meal prep, cleaning, laundry, driving, shopping, community activity, occupation, and yard work  PERSONAL FACTORS: Age, Behavior pattern,  Education, Fitness, Past/current experiences, Profession, Sex, Social background, and  Time since onset of injury/illness/exacerbation are also affecting patient's functional outcome.   REHAB POTENTIAL: Good  CLINICAL DECISION MAKING: Stable/uncomplicated  EVALUATION COMPLEXITY: Low   GOALS: Goals reviewed with patient? No  SHORT TERM GOALS: Target date: 06/12/2024    Will be compliant with appropriate progressive HEP  Baseline: Goal status: Met 05/24/24  2.  Cervical and thoracic AROM to have normalized and will be pain free  Baseline:  Goal status: progressing 05/29/24  MET 06/05/24  3.  Will demonstrate improved awareness of posture with all functional tasks  Baseline:  Goal status: met 05/29/24  4.  Noted mm spasms in neck and upper quarter to have resolved  Baseline:  Goal status: INITIAL  progressing 06/12/24  and 06/19/24    LONG TERM GOALS: Target date: 07/10/2024    MMT to be 5/5 all tested groups  Baseline:  Goal status: progressing 06/19/24  2.  Radicular sx to have resolved by at least 75% Baseline:  Goal status: progressing 06/12/24, 06/26/24 Met 75%  3.  Pain in head/neck/shoulder/elbow to have improved by at least 75% Baseline:  Goal status: 06/19/24 50%, 06/26/24 Progressing 60%   4.  Will express no difficulties with gripping/writing/typing  Baseline:  Goal status: Progressing 06/26/24  5.  Will be able to exercise as desired without increase in pain  Baseline:  Goal status:  progressing 06/12/24  6.  PSFS to have improved by at least 2 points  Baseline:  Goal status: INITIAL  PLAN:  PT FREQUENCY: 2x/week  PT DURATION: 8 weeks  PLANNED INTERVENTIONS: 97750- Physical Performance Testing, 97110-Therapeutic exercises, 97530- Therapeutic activity, [id number]- Neuromuscular re-education, 97535- Self Care, 02859- Manual therapy, [id number]- Electrical stimulation (manual), [id number]- Ultrasound, [id number]- Traction (mechanical), and 20560 (1-2 muscles), 20561 (3+ muscles)- Dry  Needling  PLAN FOR NEXT SESSION: progress strengthening Tanda Sorrow, PTA 06/26/24 8:00 AM

## 2024-07-02 NOTE — Progress Notes (Deleted)
   LILLETTE Ileana Collet, PhD, LAT, ATC acting as a scribe for Artist Lloyd, MD.  Jillian Mcknight is a 58 y.o. female who presents to [facility] today for f/u R elbow, R shoulder, and bilat foot pain w/ c-spine MRI review. Pt was last seen by Dr. Lloyd on 04/19/24 and was prescribed prednisone , gabapentin , and uric acid was checked.   Pt sent a MyChart message c/o worsening neck pain and radicular symptoms and a c-spine MRI was placed. Based on findings cervical ESI was ordered, but never administered.  Today, pt reports ***   Dx testing: 05/15/24 C-spine MRI  04/19/24 C-spine, R Great toe, & L foot XR and uric acid  Pertinent review of systems: ***  Relevant historical information: ***   Exam:  LMP  (LMP Unknown)  General: Well Developed, well nourished, and in no acute distress.   MSK: ***    Lab and Radiology Results No results found for this or any previous visit (from the past 72 hours). No results found.     Assessment and Plan: 58 y.o. female with ***   PDMP not reviewed this encounter. No orders of the defined types were placed in this encounter.  No orders of the defined types were placed in this encounter.    Discussed warning signs or symptoms. Please see discharge instructions. Patient expresses understanding.   ***

## 2024-07-03 ENCOUNTER — Ambulatory Visit: Admitting: Family Medicine

## 2024-07-11 NOTE — Progress Notes (Unsigned)
   LILLETTE Ileana Collet, PhD, LAT, ATC acting as a scribe for Artist Lloyd, MD.  Jillian Mcknight is a 58 y.o. female who presents to [facility] today for f/u R elbow, R shoulder, and bilat foot pain w/ c-spine MRI review. Pt was last seen by Dr. Lloyd on 04/19/24 and was prescribed prednisone , gabapentin , and uric acid was checked.   Pt sent a MyChart message c/o worsening neck pain and radicular symptoms and a c-spine MRI was placed. Based on findings cervical ESI was ordered, but never administered.  Today, pt reports ***   Dx testing: 05/15/24 C-spine MRI  04/19/24 C-spine, R Great toe, & L foot XR and uric acid  Pertinent review of systems: ***  Relevant historical information: ***   Exam:  LMP  (LMP Unknown)  General: Well Developed, well nourished, and in no acute distress.   MSK: ***    Lab and Radiology Results No results found for this or any previous visit (from the past 72 hours). No results found.     Assessment and Plan: 58 y.o. female with ***   PDMP not reviewed this encounter. No orders of the defined types were placed in this encounter.  No orders of the defined types were placed in this encounter.    Discussed warning signs or symptoms. Please see discharge instructions. Patient expresses understanding.   ***

## 2024-07-12 ENCOUNTER — Ambulatory Visit: Admitting: Family Medicine

## 2024-07-12 ENCOUNTER — Other Ambulatory Visit: Payer: Self-pay

## 2024-07-12 VITALS — BP 130/74 | HR 95 | Ht 65.0 in | Wt 187.0 lb

## 2024-07-12 DIAGNOSIS — M79671 Pain in right foot: Secondary | ICD-10-CM

## 2024-07-12 DIAGNOSIS — M5412 Radiculopathy, cervical region: Secondary | ICD-10-CM

## 2024-07-12 DIAGNOSIS — M79674 Pain in right toe(s): Secondary | ICD-10-CM

## 2024-07-12 NOTE — Patient Instructions (Addendum)
 Thank you for coming in today.  You received an injection today. Seek immediate medical attention if the joint becomes red, extremely painful, or is oozing fluid.  Please call [facility] ([facility]) at [phone] to schedule your spine injection.    Check back as needed

## 2024-07-19 ENCOUNTER — Encounter: Payer: Self-pay | Admitting: Family Medicine

## 2024-07-20 ENCOUNTER — Ambulatory Visit
Admission: RE | Admit: 2024-07-20 | Discharge: 2024-07-20 | Disposition: A | Discharge status: Home / Self Care | Source: Ambulatory Visit | Attending: Nurse Practitioner | Admitting: Nurse Practitioner

## 2024-07-20 DIAGNOSIS — Z1231 Encounter for screening mammogram for malignant neoplasm of breast: Secondary | ICD-10-CM

## 2024-07-25 ENCOUNTER — Encounter: Payer: Self-pay | Admitting: Family Medicine

## 2024-07-26 NOTE — Discharge Instructions (Signed)

## 2024-07-27 ENCOUNTER — Inpatient Hospital Stay
Admission: RE | Admit: 2024-07-27 | Discharge: 2024-07-27 | Disposition: A | Discharge status: Home / Self Care | Source: Ambulatory Visit | Attending: Family Medicine | Admitting: Family Medicine

## 2024-08-14 NOTE — Discharge Instructions (Signed)

## 2024-08-15 ENCOUNTER — Telehealth: Payer: Self-pay | Admitting: Family Medicine

## 2024-08-15 DIAGNOSIS — M79671 Pain in right foot: Secondary | ICD-10-CM

## 2024-08-15 NOTE — Telephone Encounter (Signed)
 Pt called, wanted Dr. Joane to know that

## 2024-08-15 NOTE — Telephone Encounter (Signed)
 Pt called, wants Dr. Joane to know that her R big toe is still bothering her with intermittent severe pain. States that the last cortisone injection took about 1 week to work.  Pt wonders if Dr. Joane would want to order an MRI or do another steroid injection, or both?

## 2024-08-17 ENCOUNTER — Inpatient Hospital Stay
Admission: RE | Admit: 2024-08-17 | Discharge: 2024-08-17 | Disposition: A | Discharge status: Home / Self Care | Source: Ambulatory Visit | Attending: Family Medicine | Admitting: Family Medicine

## 2024-08-17 NOTE — Telephone Encounter (Signed)
 I did order the MRI.  You should hear soon about scheduling.

## 2024-08-24 NOTE — Discharge Instructions (Signed)

## 2024-08-27 ENCOUNTER — Ambulatory Visit
Admission: RE | Admit: 2024-08-27 | Discharge: 2024-08-27 | Disposition: A | Source: Ambulatory Visit | Attending: Family Medicine | Admitting: Family Medicine

## 2024-08-27 DIAGNOSIS — M5412 Radiculopathy, cervical region: Secondary | ICD-10-CM

## 2024-08-27 MED ORDER — IOPAMIDOL (ISOVUE-M 300) INJECTION 61%
1.0000 mL | Freq: Once | INTRAMUSCULAR | Status: AC
  Administered 2024-08-27: 1 mL via EPIDURAL

## 2024-08-27 MED ORDER — TRIAMCINOLONE ACETONIDE 40 MG/ML IJ SUSP (RADIOLOGY)
60.0000 mg | Freq: Once | INTRAMUSCULAR | Status: AC
  Administered 2024-08-27: 60 mg via EPIDURAL

## 2024-09-02 ENCOUNTER — Ambulatory Visit
Admission: RE | Admit: 2024-09-02 | Discharge: 2024-09-02 | Disposition: A | Discharge status: Home / Self Care | Source: Ambulatory Visit | Attending: Family Medicine | Admitting: Family Medicine

## 2024-09-02 DIAGNOSIS — M79671 Pain in right foot: Secondary | ICD-10-CM

## 2024-09-04 ENCOUNTER — Ambulatory Visit: Payer: Self-pay | Admitting: Family Medicine

## 2024-09-04 DIAGNOSIS — M79671 Pain in right foot: Secondary | ICD-10-CM

## 2024-09-04 NOTE — Progress Notes (Signed)
 MRI of the right toes shows medium arthritis at the big toe joint.  This is the source of pain almost certainly.

## 2024-09-04 NOTE — Telephone Encounter (Signed)
 Forwarding to Dr. Denyse Amass to review and advise.

## 2024-09-17 ENCOUNTER — Ambulatory Visit

## 2024-09-17 ENCOUNTER — Ambulatory Visit: Admitting: Podiatry

## 2024-09-17 DIAGNOSIS — M205X1 Other deformities of toe(s) (acquired), right foot: Secondary | ICD-10-CM

## 2024-09-17 DIAGNOSIS — M2041 Other hammer toe(s) (acquired), right foot: Secondary | ICD-10-CM | POA: Diagnosis not present

## 2024-09-17 DIAGNOSIS — M7751 Other enthesopathy of right foot: Secondary | ICD-10-CM | POA: Diagnosis not present

## 2024-09-17 DIAGNOSIS — M2042 Other hammer toe(s) (acquired), left foot: Secondary | ICD-10-CM | POA: Diagnosis not present

## 2024-09-17 DIAGNOSIS — B351 Tinea unguium: Secondary | ICD-10-CM | POA: Diagnosis not present

## 2024-09-17 DIAGNOSIS — M7752 Other enthesopathy of left foot: Secondary | ICD-10-CM | POA: Diagnosis not present

## 2024-09-17 DIAGNOSIS — M779 Enthesopathy, unspecified: Secondary | ICD-10-CM

## 2024-09-17 NOTE — Progress Notes (Unsigned)
 Subjective:  Patient ID: Jillian Mcknight, female    DOB: 25-Dec-1965,  MRN: [id number]  Chief Complaint  Patient presents with   Toe Pain    Pt stated that she has been having some pain in her toes she has tried carbon insoles that was recommended to her     Discussed the use of AI scribe software for clinical note transcription with the patient, who gave verbal consent to proceed.  History of Present Illness Jillian Mcknight is a 58 year old female who presents with chronic right big toe pain following an injury.  In November of the previous year, she injured her right big toe when it was pulled after getting caught, and since then has had intermittent severe pain in the great toe joint multiple times per day. She describes a crunching, severe joint pain that can take her breath away.  She has had right foot x-rays showing no fracture, used a turf toe insert, and received a steroid injection with only minimal relief after about a week. An MRI a few weeks ago showed moderate arthritis and joint fluid without fracture.  She also has aching pain in the lesser toes of the right foot that feels different from the great toe pain. The left foot has no pain.  She has toenail fungus on the right with the nail having fallen off twice in the past year, and has only tried over-the-counter treatments.  Her uric acid level was normal, which did not support gout as the cause of the right great toe pain.      Objective:  There were no vitals filed for this visit.  Physical Exam General: AAO x3, NAD  Dermatological: Left hallux toenails hypertrophic, dystrophic with yellow, brown discoloration and subungual debris present.  Nails somewhat loose.  No edema, erythema or signs of infection.  No open lesions.  Vascular: Dorsalis Pedis artery and Posterior Tibial artery pedal pulses are 2/4 bilateral with immedate capillary fill time. There is no pain with calf compression, swelling, warmth, erythema.    Neruologic: Grossly intact via light touch bilateral.   Musculoskeletal: There is tenderness to palpation along the right first MTPJ.  Dorsal spurring is palpable.  Slight edema.  No erythema, warmth.  Digital contractures mostly noted on the fifth toes bilaterally.     No images are attached to the encounter.    Results RADIOLOGY Right foot X-ray: Mild arthritis in the right big toe joint, no fractures.  Right foot MRI: Moderate arthritis in the right big toe joint, joint effusion, no fractures   Assessment:   Capsulitis of toe, right - Plan: DG Foot Complete Right, DG Foot Complete Left  Hallux limitus, right  Hammertoes of both feet  Onychomycosis    Plan:  Patient was evaluated and treated and all questions answered.  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 MRI and X-rays confirm joint issues. Gout ruled out due to normal uric acid levels. -Discussed both conservative as well as surgical treatment options. - Administered steroid injection to right great toe joint.  Verbal consent obtained.  Cleaned skin with Betadine, alcohol.  Mixture 1 cc betamethasone, 1 cc Marcaine  plain was infiltrated into around the first MTPJ without complications.  Postinjection care discussed. - Recommended custom orthotics to alleviate joint pressure.  She was measured for orthotics today. - Discussed anti-inflammatory medications for symptom management.  Onychomycosis of toenails Recurrent onychomycosis with toenail  detachment. Oral antifungal treatment preferred but requires liver monitoring. Topical treatment is slower. - Monitor for toenail detachment and initiate treatment with new nail growth. - Discussed oral antifungal treatment (Lamisil) with liver function monitoring. - Consider topical antifungal treatment as an alternative. -  She would like to wait to  see if the nail comes off and then start medication as it grows back in.  Toe deformities of right foot Toe deformities with inward turning fifth toes causing discomfort. Non-surgical management preferred. - Recommended shoes to avoid pressure. - Discussed potential surgical options if non-surgical management is insufficient.      No follow-ups on file.   Donnice JONELLE Fees DPM

## 2024-09-18 ENCOUNTER — Other Ambulatory Visit: Payer: Self-pay | Admitting: Podiatry

## 2024-09-18 DIAGNOSIS — M779 Enthesopathy, unspecified: Secondary | ICD-10-CM

## 2024-10-16 ENCOUNTER — Telehealth: Payer: Self-pay | Admitting: *Deleted

## 2024-10-16 NOTE — Telephone Encounter (Signed)
 I called and informed the patient that her orthotics are here.  I scheduled her an appointment on 10/29/2024 at 10:30 am.

## 2024-10-29 ENCOUNTER — Encounter: Payer: Self-pay | Admitting: Nurse Practitioner

## 2024-10-29 ENCOUNTER — Ambulatory Visit (INDEPENDENT_AMBULATORY_CARE_PROVIDER_SITE_OTHER): Admitting: Podiatrist

## 2024-10-29 ENCOUNTER — Ambulatory Visit (INDEPENDENT_AMBULATORY_CARE_PROVIDER_SITE_OTHER): Payer: Managed Care, Other (non HMO) | Admitting: Nurse Practitioner

## 2024-10-29 VITALS — BP 126/80 | HR 89 | Temp 98.0°F | Ht 66.0 in | Wt 182.2 lb

## 2024-10-29 DIAGNOSIS — Z0001 Encounter for general adult medical examination with abnormal findings: Secondary | ICD-10-CM | POA: Diagnosis not present

## 2024-10-29 DIAGNOSIS — E7800 Pure hypercholesterolemia, unspecified: Secondary | ICD-10-CM

## 2024-10-29 DIAGNOSIS — F3342 Major depressive disorder, recurrent, in full remission: Secondary | ICD-10-CM

## 2024-10-29 DIAGNOSIS — M2042 Other hammer toe(s) (acquired), left foot: Secondary | ICD-10-CM

## 2024-10-29 DIAGNOSIS — M205X1 Other deformities of toe(s) (acquired), right foot: Secondary | ICD-10-CM

## 2024-10-29 DIAGNOSIS — F411 Generalized anxiety disorder: Secondary | ICD-10-CM | POA: Diagnosis not present

## 2024-10-29 DIAGNOSIS — M2041 Other hammer toe(s) (acquired), right foot: Secondary | ICD-10-CM

## 2024-10-29 DIAGNOSIS — M7751 Other enthesopathy of right foot: Secondary | ICD-10-CM

## 2024-10-29 MED ORDER — FLUOXETINE HCL 10 MG PO TABS: 20.0000 mg | ORAL_TABLET | Freq: Every day | ORAL | 3 refills | Status: AC

## 2024-10-29 NOTE — Assessment & Plan Note (Addendum)
 No Fhx of premature CAD. No hx of tobacco use or illicit drug use or ALCOHOL abuse or DIABETES or HYPERTENSION. Repeat lipid panel Advised to maintain a mediterranean diet and daily exercise

## 2024-10-29 NOTE — Assessment & Plan Note (Signed)
 Improved and stable mood Maintain med dose at 20mg  daily Med refill sent

## 2024-10-29 NOTE — Progress Notes (Signed)
 Patient presents today to pick up orthotics.  Orthotics ordered are metatarsal length and she was wanting full length to help with her hallux limitus/ bone spur right and toe pain.  I will re order the orthotics in full length with offoloading for the right first mpj   Will call when orthotics are in for pick up

## 2024-10-29 NOTE — Patient Instructions (Addendum)
 Schedule nurse visit for prevnar 20 vaccine. Go to lab Maintain Heart healthy diet and daily exercise. Maintain current medications.

## 2024-10-29 NOTE — Progress Notes (Signed)
 "  Complete physical exam  Patient: Jillian Mcknight   DOB: [birth year]   59 y.o. Female  MRN: [id number] Visit Date: 10/29/2024  Subjective:    Chief Complaint  Patient presents with   Annual Exam   Jillian Mcknight is a 59 y.o. female who presents today for a complete physical exam. She reports consuming a low fat diet. Walking and strength training 3x/week, 15-38mins each day She generally feels well. She reports sleeping well. She does not have additional problems to discuss today.  Vision:Yes Dental:Yes STD Screen:No BP Readings from Last 3 Encounters:  10/29/24 126/80  08/27/24 (!) 152/94  07/12/24 130/74   Wt Readings from Last 3 Encounters:  10/29/24 182 lb 3.2 oz (82.6 kg)  07/12/24 187 lb (84.8 kg)  04/19/24 183 lb (83 kg)    Most recent fall risk assessment:    10/29/2024    8:45 AM  Fall Risk   Falls in the past year? 0  Risk for fall due to : No Fall Risks  Follow up Falls evaluation completed     Depression screen:Yes - Depression Most recent depression screenings:    10/29/2024    8:45 AM 03/29/2024    2:44 PM  PHQ 2/9 Scores  PHQ - 2 Score 0 0  PHQ- 9 Score 1     HPI  GAD (generalized anxiety disorder) Improved and stable mood Maintain med dose at 20mg  daily Med refill sent  Hyperlipidemia No Fhx of premature CAD. No hx of tobacco use or illicit drug use or ALCOHOL abuse or DIABETES or HYPERTENSION. Repeat lipid panel Advised to maintain a mediterranean diet and daily exercise   Past Medical History:  Diagnosis Date   Allergy    Arthritis    knees   Carpal tunnel syndrome of right wrist    Depression    Headache, frequent episodic tension-type    Past Surgical History:  Procedure Laterality Date   BREAST BIOPSY Left 03/2021   CARPAL TUNNEL RELEASE Right 08/10/2016   Procedure: RIGHT CARPAL TUNNEL RELEASE;  Surgeon: Arley Curia, MD;  Location: [facility];  Service: Orthopedics;  Laterality: Right;  FAB   ELBOW FRACTURE  SURGERY Left 11/01/2018   WISDOM TOOTH EXTRACTION  1982   Social History   Socioeconomic History   Marital status: Married    Spouse name: Not on file   Number of children: Not on file   Years of education: Not on file   Highest education level: Not on file  Occupational History   Not on file  Tobacco Use   Smoking status: Never   Smokeless tobacco: Never  Vaping Use   Vaping status: Never Used  Substance and Sexual Activity   Alcohol use: Yes    Alcohol/week: 0.0 standard drinks of alcohol    Comment: socially   Drug use: No   Sexual activity: Yes  Other Topics Concern   Not on file  Social History Narrative   Not on file   Social Drivers of Health   Tobacco Use: Low Risk (10/29/2024)   Patient History    Smoking Tobacco Use: Never    Smokeless Tobacco Use: Never    Passive Exposure: Not on file  Financial Resource Strain: Not on file  Food Insecurity: Not on file  Transportation Needs: Not on file  Physical Activity: Not on file  Stress: Not on file  Social Connections: Not on file  Intimate Partner Violence: Not on file  Depression (EYV7-0): Low Risk (  10/29/2024)   Depression (PHQ2-9)    PHQ-2 Score: 1  Alcohol Screen: Not on file  Housing: Not on file  Utilities: Not on file  Health Literacy: Not on file   Family Status  Relation Name Status   Mother  Deceased   Father  Deceased   Sister  Alive   Daughter  (Not Specified)   PGM  (Not Specified)   Cousin  Alive   Cousin  Alive   Neg Hx  (Not Specified)  No partnership data on file   Family History  Problem Relation Age of Onset   Heart disease Mother    Stroke Mother    Lymphoma Mother    Alzheimer's disease Father    Bipolar disorder Sister    Colitis Daughter    Diabetes Paternal Grandmother    Cancer Cousin    Colon cancer Cousin 45       colon   Colon cancer Cousin 45       colon   Breast cancer Neg Hx    Allergies[1]  Patient Care Team: Fredrika Canby, Roselie Rockford, NP as PCP - General  (Internal Medicine)   Medications: Show/hide medication list[2]  Review of Systems  Constitutional:  Negative for activity change, appetite change and unexpected weight change.  Respiratory: Negative.    Cardiovascular: Negative.   Gastrointestinal: Negative.   Endocrine: Negative for cold intolerance and heat intolerance.  Genitourinary: Negative.   Musculoskeletal: Negative.   Skin: Negative.   Neurological: Negative.   Hematological: Negative.   Psychiatric/Behavioral:  Negative for behavioral problems, decreased concentration, dysphoric mood, hallucinations, self-injury, sleep disturbance and suicidal ideas. The patient is not nervous/anxious.         Objective:  BP 126/80   Pulse 89   Temp 98 F (36.7 C)   Ht 5' 6 (1.676 m)   Wt 182 lb 3.2 oz (82.6 kg)   LMP  (LMP Unknown)   SpO2 98%   BMI 29.41 kg/m     Physical Exam Vitals and nursing note reviewed.  Constitutional:      General: She is not in acute distress. HENT:     Right Ear: Tympanic membrane, ear canal and external ear normal.     Left Ear: Tympanic membrane, ear canal and external ear normal.     Nose: Nose normal.  Eyes:     Extraocular Movements: Extraocular movements intact.     Conjunctiva/sclera: Conjunctivae normal.     Pupils: Pupils are equal, round, and reactive to light.  Neck:     Thyroid : No thyroid  mass, thyromegaly or thyroid  tenderness.  Cardiovascular:     Rate and Rhythm: Normal rate and regular rhythm.     Pulses: Normal pulses.     Heart sounds: Normal heart sounds.  Pulmonary:     Effort: Pulmonary effort is normal.     Breath sounds: Normal breath sounds.  Abdominal:     General: Bowel sounds are normal.     Palpations: Abdomen is soft.  Musculoskeletal:        General: Normal range of motion.     Cervical back: Normal range of motion and neck supple.     Right lower leg: No edema.     Left lower leg: No edema.  Lymphadenopathy:     Cervical: No cervical adenopathy.   Skin:    General: Skin is warm and dry.  Neurological:     Mental Status: She is alert and oriented to person, place, and time.  Cranial Nerves: No cranial nerve deficit.  Psychiatric:        Mood and Affect: Mood normal.        Behavior: Behavior normal.        Thought Content: Thought content normal.      No results found for any visits on 10/29/24.    Assessment & Plan:    Routine Health Maintenance and Physical Exam  Immunization History  Administered Date(s) Administered   Influenza Inj Mdck Quad Pf 06/24/2018, 07/09/2022   Influenza Inj Mdck Quad With Preservative 08/08/2021   Influenza Split 07/18/2013, 07/15/2014   Influenza, Mdck, Trivalent,PF 6+ MOS(egg free) 07/16/2023, 07/09/2024   Influenza, Quadrivalent, Recombinant, Inj, Pf 08/03/2018   Influenza,inj,Quad PF,6+ Mos 07/19/2015, 06/28/2017, 06/28/2019   Influenza-Unspecified 08/01/2016, 08/03/2018, 07/13/2020, 08/18/2022, 07/16/2023   Moderna Covid-19 Vaccine Bivalent Booster 85yrs & up 07/16/2021   Novel Infuenza-h1n1-09 08/24/2008   PFIZER(Purple Top)SARS-COV-2 Vaccination 12/17/2019, 12/28/2019, 01/18/2020, 07/19/2020, 08/08/2020   Pneumococcal Polysaccharide-23 10/05/2019   Tdap 01/23/2010, 05/15/2014, 01/09/2018   Zoster Recombinant(Shingrix) 11/14/2020, 01/09/2021, 01/24/2021   Zoster, Live 01/24/2021    Health Maintenance  Topic Date Due   COVID-19 Vaccine (7 - 2025-26 season) 11/14/2024 (Originally 06/18/2024)   Pneumococcal Vaccine: 50+ Years (2 of 2 - PCV) 10/29/2025 (Originally 10/04/2020)   HIV Screening  10/29/2025 (Originally 10/06/1981)   Colonoscopy  11/27/2025   Mammogram  07/20/2026   Cervical Cancer Screening (HPV/Pap Cotest)  10/21/2027   DTaP/Tdap/Td (4 - Td or Tdap) 01/10/2028   Influenza Vaccine  Completed   Hepatitis C Screening  Completed   Zoster Vaccines- Shingrix  Completed   Hepatitis B Vaccines 19-59 Average Risk  Aged Out   HPV VACCINES  Aged Out   Meningococcal B  Vaccine  Aged Out    Discussed health benefits of physical activity, and encouraged her to engage in regular exercise appropriate for her age and condition.  Problem List Items Addressed This Visit     GAD (generalized anxiety disorder)   Improved and stable mood Maintain med dose at 20mg  daily Med refill sent      Relevant Medications   FLUoxetine  (PROZAC ) 10 MG tablet   Hyperlipidemia   No Fhx of premature CAD. No hx of tobacco use or illicit drug use or ALCOHOL abuse or DIABETES or HYPERTENSION. Repeat lipid panel Advised to maintain a mediterranean diet and daily exercise      Relevant Orders   Lipid panel   Other Visit Diagnoses       Encounter for preventative adult health care exam with abnormal findings    -  Primary   Relevant Orders   Comprehensive metabolic panel with GFR     Recurrent major depressive disorder, in full remission       Relevant Medications   FLUoxetine  (PROZAC ) 10 MG tablet      Return in about 1 year (around 10/29/2025) for CPE (fasting).     Roselie Mood, NP     [1]  Allergies Allergen Reactions   Sulfamethoxazole-Trimethoprim     Other reaction(s): Abdominal Pain   Other    Sulfamethoxazole Nausea And Vomiting    Other reaction(s): GI Upset (intolerance)  [2]  Outpatient Medications Prior to Visit  Medication Sig   phenazopyridine  (PYRIDIUM ) 200 MG tablet Take 1 tablet (200 mg total) by mouth 3 (three) times daily as needed for pain.   [DISCONTINUED] FLUoxetine  (PROZAC ) 10 MG tablet Take 2 tablets (20 mg total) by mouth daily.   [DISCONTINUED] gabapentin  (NEURONTIN ) 100 MG capsule  Take 1-3 capsules (100-300 mg total) by mouth 3 (three) times daily as needed. (Patient not taking: Reported on 10/29/2024)   [DISCONTINUED] predniSONE  (STERAPRED UNI-PAK 48 TAB) 5 MG (48) TBPK tablet 12 day dosepack po (Patient not taking: Reported on 10/29/2024)   No facility-administered medications prior to visit.   "

## 2024-10-30 ENCOUNTER — Ambulatory Visit: Payer: Self-pay | Admitting: Nurse Practitioner

## 2024-10-30 DIAGNOSIS — E786 Lipoprotein deficiency: Secondary | ICD-10-CM

## 2024-10-30 DIAGNOSIS — E78019 Familial hypercholesterolemia, unspecified: Secondary | ICD-10-CM

## 2024-10-30 LAB — COMPREHENSIVE METABOLIC PANEL WITH GFR
ALT: 16 IU/L (ref 0–32)
AST: 19 IU/L (ref 0–40)
Albumin: 4.4 g/dL (ref 3.8–4.9)
Alkaline Phosphatase: 59 IU/L (ref 49–135)
BUN/Creatinine Ratio: 20 (ref 12–28)
BUN: 15 mg/dL (ref 8–27)
Bilirubin Total: 0.4 mg/dL (ref 0.0–1.2)
CO2: 23 mmol/L (ref 20–29)
Calcium: 9.9 mg/dL (ref 8.7–10.3)
Chloride: 100 mmol/L (ref 96–106)
Creatinine, Ser: 0.75 mg/dL (ref 0.57–1.00)
Globulin, Total: 2.5 g/dL (ref 1.5–4.5)
Glucose: 78 mg/dL (ref 70–99)
Potassium: 4.5 mmol/L (ref 3.5–5.2)
Sodium: 140 mmol/L (ref 134–144)
Total Protein: 6.9 g/dL (ref 6.0–8.5)
eGFR: 91 mL/min/1.73

## 2024-10-30 LAB — LIPID PANEL
Chol/HDL Ratio: 3.3 ratio (ref 0.0–4.4)
Cholesterol, Total: 319 mg/dL — ABNORMAL HIGH (ref 100–199)
HDL: 98 mg/dL
LDL Chol Calc (NIH): 215 mg/dL — ABNORMAL HIGH (ref 0–99)
Triglycerides: 52 mg/dL (ref 0–149)
VLDL Cholesterol Cal: 6 mg/dL (ref 5–40)

## 2024-10-30 MED ORDER — ATORVASTATIN CALCIUM 10 MG PO TABS: 10.0000 mg | ORAL_TABLET | Freq: Every day | ORAL | 5 refills | Status: AC

## 2024-10-30 NOTE — Addendum Note (Signed)
 Addended by: KATHEEN ROSELIE CROME on: 10/30/2024 03:44 PM   Modules accepted: Orders

## 2024-11-02 NOTE — Telephone Encounter (Signed)
 Called patient and made her aware that the labs are in for [facility] and that I spoke with our office phlebotomist and she confirmed that [facility] can see the orders and if something can't be pulled up I was informed that they will call our lab and the lab orders will be faxed to that location. She also asked if I could have Roselie take a look at the lab information she sent in about the familial labs and let her know what she thinks of what she may need

## 2024-11-02 NOTE — Telephone Encounter (Signed)
 Copied from CRM [id number]. Topic: Appointments - Scheduling Inquiry for Clinic >> Nov 02, 2024 11:06 AM Jillian Mcknight wrote: Reason for CRM: Patient has lab orders entered by PCP and she is questioning if she can get these completed at a [place] location instead of PCP's location.

## 2024-11-06 ENCOUNTER — Other Ambulatory Visit (INDEPENDENT_AMBULATORY_CARE_PROVIDER_SITE_OTHER)

## 2024-11-06 DIAGNOSIS — E786 Lipoprotein deficiency: Secondary | ICD-10-CM | POA: Diagnosis not present

## 2024-11-07 ENCOUNTER — Other Ambulatory Visit

## 2024-11-07 ENCOUNTER — Ambulatory Visit: Payer: Self-pay | Admitting: Nurse Practitioner

## 2024-11-07 ENCOUNTER — Ambulatory Visit (HOSPITAL_COMMUNITY)
Admission: RE | Admit: 2024-11-07 | Discharge: 2024-11-07 | Disposition: A | Discharge status: Home / Self Care | Payer: Self-pay | Source: Ambulatory Visit | Attending: Nurse Practitioner | Admitting: Nurse Practitioner

## 2024-11-07 DIAGNOSIS — E786 Lipoprotein deficiency: Secondary | ICD-10-CM

## 2024-11-07 DIAGNOSIS — E7800 Pure hypercholesterolemia, unspecified: Secondary | ICD-10-CM

## 2024-11-07 LAB — LIPID PANEL
Chol/HDL Ratio: 3 ratio (ref 0.0–4.4)
Cholesterol, Total: 284 mg/dL — ABNORMAL HIGH (ref 100–199)
HDL: 95 mg/dL
LDL Chol Calc (NIH): 179 mg/dL — ABNORMAL HIGH (ref 0–99)
Triglycerides: 65 mg/dL (ref 0–149)
VLDL Cholesterol Cal: 10 mg/dL (ref 5–40)

## 2024-11-07 LAB — HIGH SENSITIVITY CRP: CRP, High Sensitivity: 1.31 mg/L (ref 0.00–3.00)

## 2024-11-07 LAB — LIPOPROTEIN A (LPA): Lipoprotein (a): 20.3 nmol/L

## 2024-11-07 LAB — APOLIPOPROTEIN B: Apolipoprotein B: 117 mg/dL — ABNORMAL HIGH

## 2024-11-08 ENCOUNTER — Telehealth: Payer: Self-pay | Admitting: Podiatry

## 2024-11-08 ENCOUNTER — Other Ambulatory Visit (HOSPITAL_COMMUNITY)

## 2024-11-08 NOTE — Telephone Encounter (Signed)
 Orthotics are in [place] office.  Spoke to Clinton and scheduled her an appointment on 11/16/24 to [facility]

## 2024-11-08 NOTE — Assessment & Plan Note (Signed)
 Elevated ApoB, but normal CRP and lipoA. Elevated ApoB indicates an increased risk for cardiovascular disease. With long standing history of elevated LDL, I recommend maintaining atorvastatin  dose even though CT cardiac score is zero. I also recommend repeat your CT cardiac score in 3-5 yrs. We need to repeat lipid panel in 2months (fasting)

## 2024-11-14 ENCOUNTER — Encounter: Payer: Self-pay | Admitting: Family Medicine

## 2024-11-14 DIAGNOSIS — M5412 Radiculopathy, cervical region: Secondary | ICD-10-CM

## 2024-11-16 ENCOUNTER — Ambulatory Visit: Admitting: Podiatrist

## 2024-11-16 DIAGNOSIS — M7751 Other enthesopathy of right foot: Secondary | ICD-10-CM

## 2024-11-16 DIAGNOSIS — M205X1 Other deformities of toe(s) (acquired), right foot: Secondary | ICD-10-CM

## 2024-11-16 NOTE — Progress Notes (Signed)
 ORTHOTIC DISPENSING:  re done orthotics that are full length are dispensed.  She is happy with the devices.     GOAL OF ORTHOSIS - Improve gait - Decrease energy expenditure - Improve Balance - Provide Triplanar stability of foot complex - Facilitate motion   ACTIONS PERFORMED Patient was fit with custom foot orthoses-  full length. Neoprene topcover  Davien Malone, DPM

## 2024-11-20 ENCOUNTER — Encounter: Payer: Self-pay | Admitting: Podiatry

## 2024-11-22 ENCOUNTER — Encounter: Payer: Self-pay | Admitting: Family Medicine

## 2024-11-23 ENCOUNTER — Ambulatory Visit: Admitting: Podiatry

## 2024-11-23 MED ORDER — EFINACONAZOLE 10 % EX SOLN: 1.0000 [drp] | Freq: Every day | CUTANEOUS | 11 refills | Status: AC

## 2024-11-23 NOTE — Progress Notes (Unsigned)
 Sx shoe Right ch Send back orthotics

## 2024-11-23 NOTE — Patient Instructions (Signed)
 Preparing for Surgery      Thank you for choosing [facility] for your surgical care. Our board-certified and board-qualified physicians bring advanced training and a deep commitment to the highest standards in foot and ankle surgery.   From your first consultation to your final steps in recovery, our team is here to ensure you feel informed, supported, and confident throughout the entire process.   Visit [email or web address] to access our Surgery Patient page, where youll find all of the information listed below, plus additional helpful resources.      The Time of Your Surgery   For hospital procedures, your surgery time will be provided at the time of scheduling. If youre scheduled at [facility], youll receive a call from the surgical center 24 hours before your procedure with your confirmed time.?Please refer to the surgery information provided to you during your surgical consultation to determine where your surgery will take place.  Recommended Devices for Easier Recovery  Devices such as a knee scooter, crutches, walker, or wheelchair may or may not be covered by your insurance. If your surgeon has recommended this after surgery and it is not covered by your insurance you are still responsible for obtaining and using the recommended equipment. If you have questions or concerns about what equipment you will need please contact the office.  To support a smoother recovery, weve gathered a list of helpful, recommended equipment. Visit [email or web address] to see the full list.       Taking Medications?   If you are taking daily heart and blood pressure medications, seizure, reflux, allergy, asthma, anxiety, pain, or diabetes medications, make sure you notify the surgery center/hospital before the day of surgery so they can tell you which medications you should take or avoid the day of surgery.    GLP-1 antagonists taken weekly  should be stopped for 7 days before surgery. GLP-1 antagonists taken daily be stopped on the day of surgery. These include:   Dulaglutide (Trulicity)   Exenatide extended release (Bydureon BCise)   Exenatide (Byetta)   Semaglutide (Ozempic; Birch.brandt)   Tirzepatide (Mounjaro)   Liraglutide (Victoza, Saxenda)   Lixisenatide (Adlyxin)   Phentermine (Adipex-P, Lomaira)   Semaglutide (Rybelsus) is oral and should be held for 3 days.   Metformin - should be held for 2 days prior to surgery.   SGLT2 inhibitors should be stopped for 3 days (no longer because of the risk of euglycemic diabetic ketoacidosis) and include:   Canagliflozin (Invokana)   Ertugliflozin (Steglatro)   Dapagliflozin (Farxiga)   Empagliflozin (Jardiance)   Additional medications to stop:   If you take blood thinners (Warfarin, Eliquis, Xarelto), please consult your doctor about stopping before your procedure.   Aspirin: Consult your doctor about stopping 1 week before your procedure.   Anti-inflammatory medications (such as ibuprofen )        Pre-Operative Instructions   Plan to be at the hospital at least an hour and a half (1.5), or at the surgery center (1) hour before your scheduled time, unless otherwise directed by the surgical center/hospital staff. You must have a responsible adult accompany you, remain during the surgery, and drive you home. Make sure you have directions to the surgical center/hospital to ensure you arrive on time.       [facility] [place]             [address]    [address]  [place], [place] [address]    [place], [place] [address]                 [phone]      [facility]   [place] [facility] [place]         [address]          [place], [place] [address]                            [place], [place] [address]     [facility]         [address]          [place], [place] [address]      If you are having surgery at [facility], you will need a copy of your medical history and physical form from your family physician within one month before the date of surgery. We will give you a form for your primary physician to complete.       We will make every effort to accommodate the date you request for surgery. However, there are times when surgery dates or times need to be moved. We will contact you as soon as possible if a schedule change is required.     No aspirin/ibuprofen  for one week before surgery. If you are on aspirin, any non-steroidal anti-inflammatory medications (Mobic , Aleve, Ibuprofen ) should not be taken seven (7) days before surgery.       No food or drink after midnight the night before surgery unless directed otherwise by the surgical center/hospital staff. If you are having a surgical procedure in our office, and not in the surgical center or hospital, this does not apply to you.     No alcoholic beverages 24 hours before surgery. No smoking 24 hours before or 24 hours after surgery.      Wear loose pants or shorts. They should be loose enough to fit over bandages, boots, and casts.     Do not wear slip-on shoes. Sneakers are preferred.      If you were given a boot during your surgery consultation appointment, be sure to bring it with you to the surgery center/hospital on the day of your surgery. Also, bring crutches, a knee scooter, or a walker if your physician prescribed it for you before your surgery date.      If you have not been contacted by the surgery center/hospital by the day before your surgery, call the surgery center/hospital to confirm the date and time of your surgery.     Leave time from work may vary depending on the type of surgery you have. Appropriate arrangements should be made before surgery with your employer.     Prescriptions will be electronically submitted  to your pharmacy the evening before your surgery or the day of your surgery. Take the medication as directed. Pain medications will not be refilled on weekends and must be approved by the doctor.      Remove nail polish on the operative foot and avoid getting a pedicure two weeks before surgery.      The night before surgery, wash the foot and leg that is being operated on with water and the antibacterial soap that was provided at your consultation appointment. The antibacterial soap is encased in the  brush. You will need to wet the brush to cleanse the area. Be sure to pay special attention to beneath the toenails and in between the toes. Wash for at least three (3) minutes. Rinse thoroughly with water and pat dry with a towel. Perform this wash unless told not to do so by your physician.    If you have any questions regarding the scheduling of your surgery, please contact our surgical department at [phone]   If you have any questions regarding any of these instructions, please do not hesitate to call our triage nurse at [phone]    --  Efinaconazole  Topical solution What is this medication? EFINACONAZOLE  (e FEE na KON a zole) treats fungal infections of the nails. It belongs to a group of medications called antifungals. It will not treat infections caused by bacteria or viruses. This medicine may be used for other purposes; ask your health care provider or pharmacist if you have questions. COMMON BRAND NAME(S): JUBLIA  What should I tell my care team before I take this medication? They need to know if you have any of these conditions: An unusual or allergic reaction to efinaconazole , other medications, foods, dyes or preservatives Pregnant or trying to get pregnant Breast-feeding How should I use this medication? This medication is for external use only. Do not take by mouth. Wash your hands before and after use. Do not get it in your eyes. If you do, rinse your eyes with plenty  of cool tap water. Use it as directed on the prescription label. Do not use it more often than directed. Use the medication for the full course as directed by your care team, even if you think you are better. Do not stop using it unless your care team tells you to stop it early. This medication comes with INSTRUCTIONS FOR USE. Ask your pharmacist for directions on how to use this medication. Read the information carefully. Talk to your pharmacist or care team if you have questions. Talk to your care team about the use of this medication in children. While it may be prescribed for children as young as 6 years for selected conditions, precautions do apply. Overdosage: If you think you have taken too much of this medicine contact a poison control center or emergency room at once. NOTE: This medicine is only for you. Do not share this medicine with others. What if I miss a dose? If you miss a dose, use it as soon as you can. If it is almost time for your next dose, use only that dose. Do not use double or extra doses. What may interact with this medication? Interactions are not expected. Do not use any other skin products on the same area of skin without talking to your care team. This list may not describe all possible interactions. Give your health care provider a list of all the medicines, herbs, non-prescription drugs, or dietary supplements you use. Also tell them if you smoke, drink alcohol, or use illegal drugs. Some items may interact with your medicine. What should I watch for while using this medication? Visit your care team for regular checks on your progress. It may be some time before you see the benefit from this medication. Do not use nail polish or other nail cosmetic products on the treated nails. What side effects may I notice from receiving this medication? Side effects that you should report to your care team as soon as possible: Allergic reactions--skin rash, itching, hives, swelling  of the face,  lips, tongue, or throat Side effects that usually do not require medical attention (report to your care team if they continue or are bothersome): Ingrown nails Mild skin irritation, redness, or dryness This list may not describe all possible side effects. Call your doctor for medical advice about side effects. You may report side effects to FDA at 1-800-FDA-1088. Where should I keep my medication? Keep out of the reach of children and pets. Store at room temperature between 20 and 25 degrees C (68 and 77 degrees F). Do not freeze. Keep the container tightly closed. Get rid of any unused medication after the expiration date. This medication is flammable. Avoid exposure to heat, flame, and smoking. To get rid of medications that are no longer needed or have expired: Take the medication to a medication take-back program. Check with your pharmacy or law enforcement to find a location. If you cannot return the medication, ask your pharmacist or care team how to get rid of this medication safely. NOTE: This sheet is a summary. It may not cover all possible information. If you have questions about this medicine, talk to your doctor, pharmacist, or health care provider.  2024 Elsevier/Gold Standard (2021-12-14 00:00:00)

## 2024-12-03 ENCOUNTER — Other Ambulatory Visit
